# Patient Record
Sex: Female | Born: 1965 | Race: White | Hispanic: No | Smoking: Current every day smoker
Health system: Southern US, Community
[De-identification: ages and names within clinical notes are randomized; demographics above are authoritative.]

## PROBLEM LIST (undated history)

## (undated) DIAGNOSIS — M549 Dorsalgia, unspecified: Secondary | ICD-10-CM

## (undated) DIAGNOSIS — E785 Hyperlipidemia, unspecified: Secondary | ICD-10-CM

## (undated) DIAGNOSIS — I255 Ischemic cardiomyopathy: Secondary | ICD-10-CM

## (undated) DIAGNOSIS — I519 Heart disease, unspecified: Secondary | ICD-10-CM

## (undated) DIAGNOSIS — B019 Varicella without complication: Secondary | ICD-10-CM

## (undated) DIAGNOSIS — K921 Melena: Secondary | ICD-10-CM

## (undated) DIAGNOSIS — K219 Gastro-esophageal reflux disease without esophagitis: Secondary | ICD-10-CM

## (undated) DIAGNOSIS — G8929 Other chronic pain: Secondary | ICD-10-CM

## (undated) DIAGNOSIS — I10 Essential (primary) hypertension: Secondary | ICD-10-CM

## (undated) DIAGNOSIS — E669 Obesity, unspecified: Secondary | ICD-10-CM

## (undated) DIAGNOSIS — I214 Non-ST elevation (NSTEMI) myocardial infarction: Secondary | ICD-10-CM

## (undated) DIAGNOSIS — I2510 Atherosclerotic heart disease of native coronary artery without angina pectoris: Secondary | ICD-10-CM

## (undated) HISTORY — PX: CARDIAC CATHETERIZATION: SHX172

## (undated) HISTORY — DX: Atherosclerotic heart disease of native coronary artery without angina pectoris: I25.10

## (undated) HISTORY — DX: Varicella without complication: B01.9

## (undated) HISTORY — DX: Ischemic cardiomyopathy: I25.5

## (undated) HISTORY — DX: Heart disease, unspecified: I51.9

## (undated) HISTORY — DX: Melena: K92.1

## (undated) HISTORY — DX: Non-ST elevation (NSTEMI) myocardial infarction: I21.4

## (undated) HISTORY — DX: Gastro-esophageal reflux disease without esophagitis: K21.9

## (undated) HISTORY — PX: TUBAL LIGATION: SHX77

---

## 2012-05-27 ENCOUNTER — Ambulatory Visit: Payer: Self-pay | Admitting: Internal Medicine

## 2012-06-04 ENCOUNTER — Ambulatory Visit: Payer: Self-pay | Admitting: Internal Medicine

## 2012-12-18 ENCOUNTER — Ambulatory Visit: Payer: Self-pay | Admitting: Surgery

## 2014-06-15 IMAGING — MG MM CAD SCREENING MAMMO
1 series · 4 of 4 positions shown · non-contrast
Comparison: None.

REASON FOR EXAM: SCR MAMMO NO ORDER BASELINE
COMMENTS:

PROCEDURE:     MAM - MAM DGTL SCRN MAM NO ORDER W/CAD  - May 27, 2012  [DATE]
RESULT:

[R CC · right · 4 of 4 slices shown]
[im 1/4]
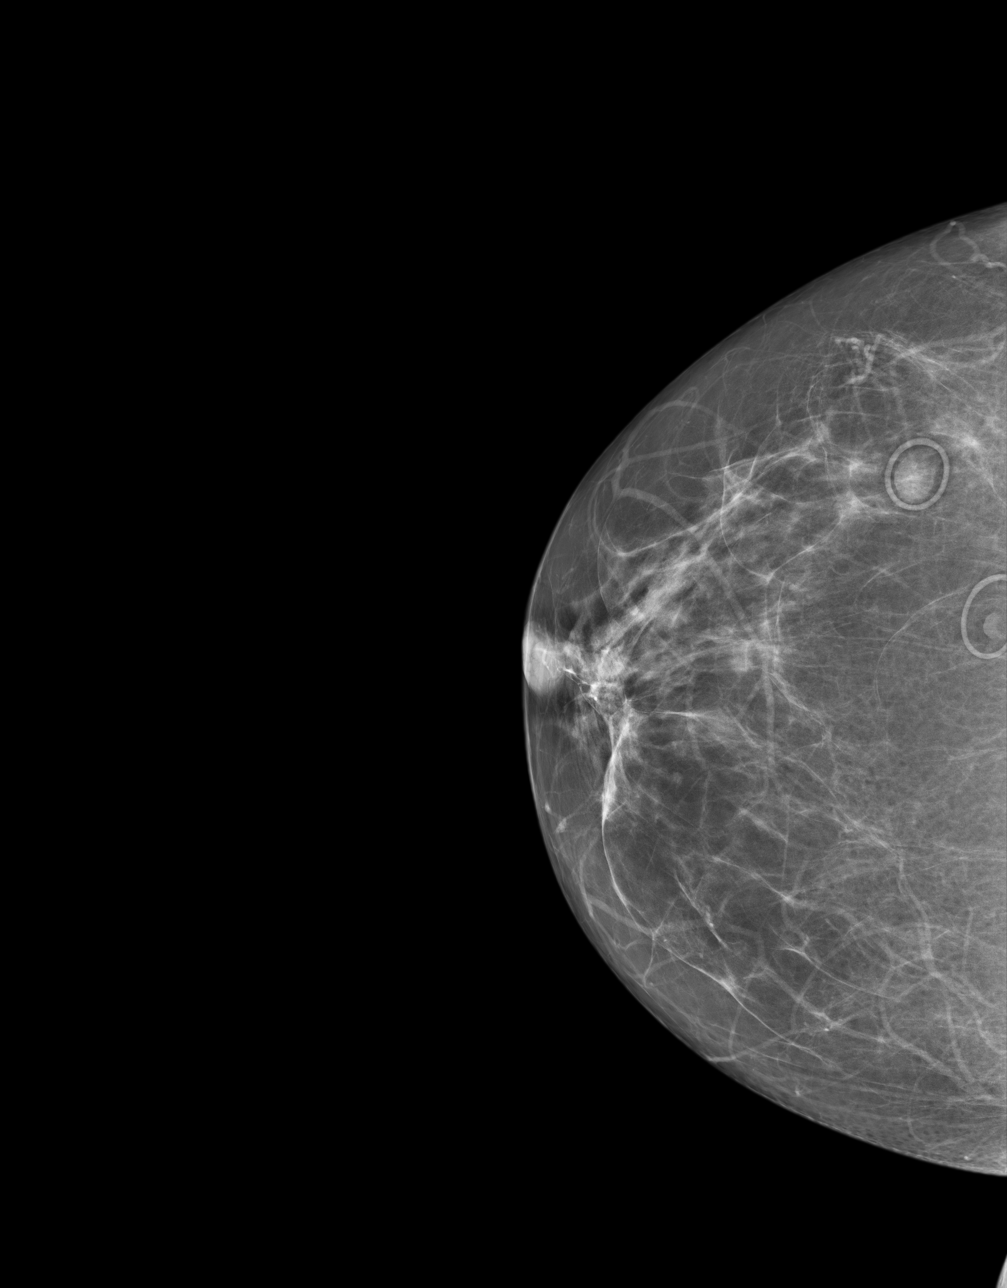
[im 2/4]
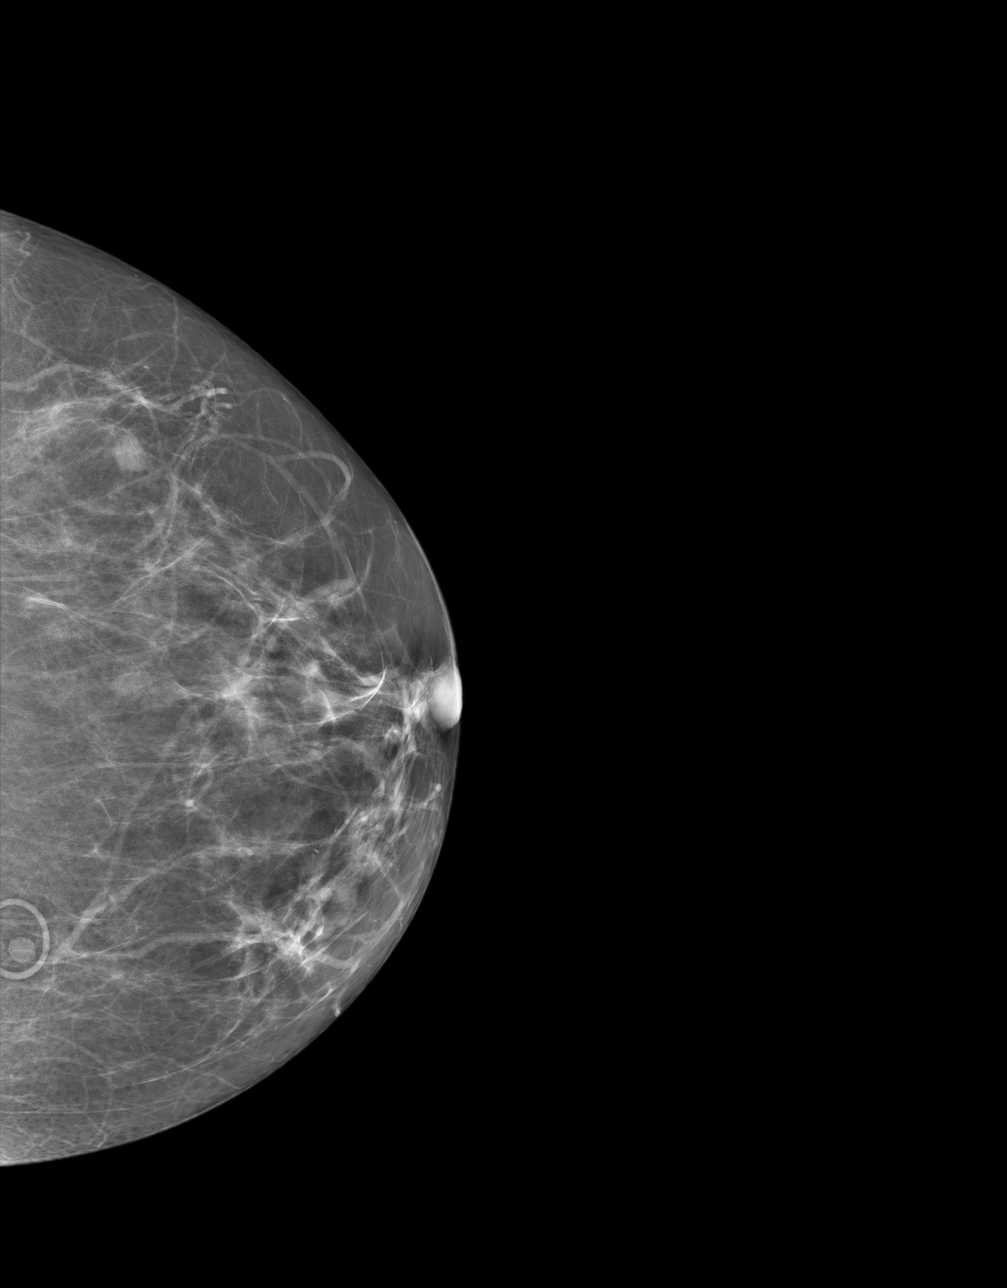
[im 3/4]
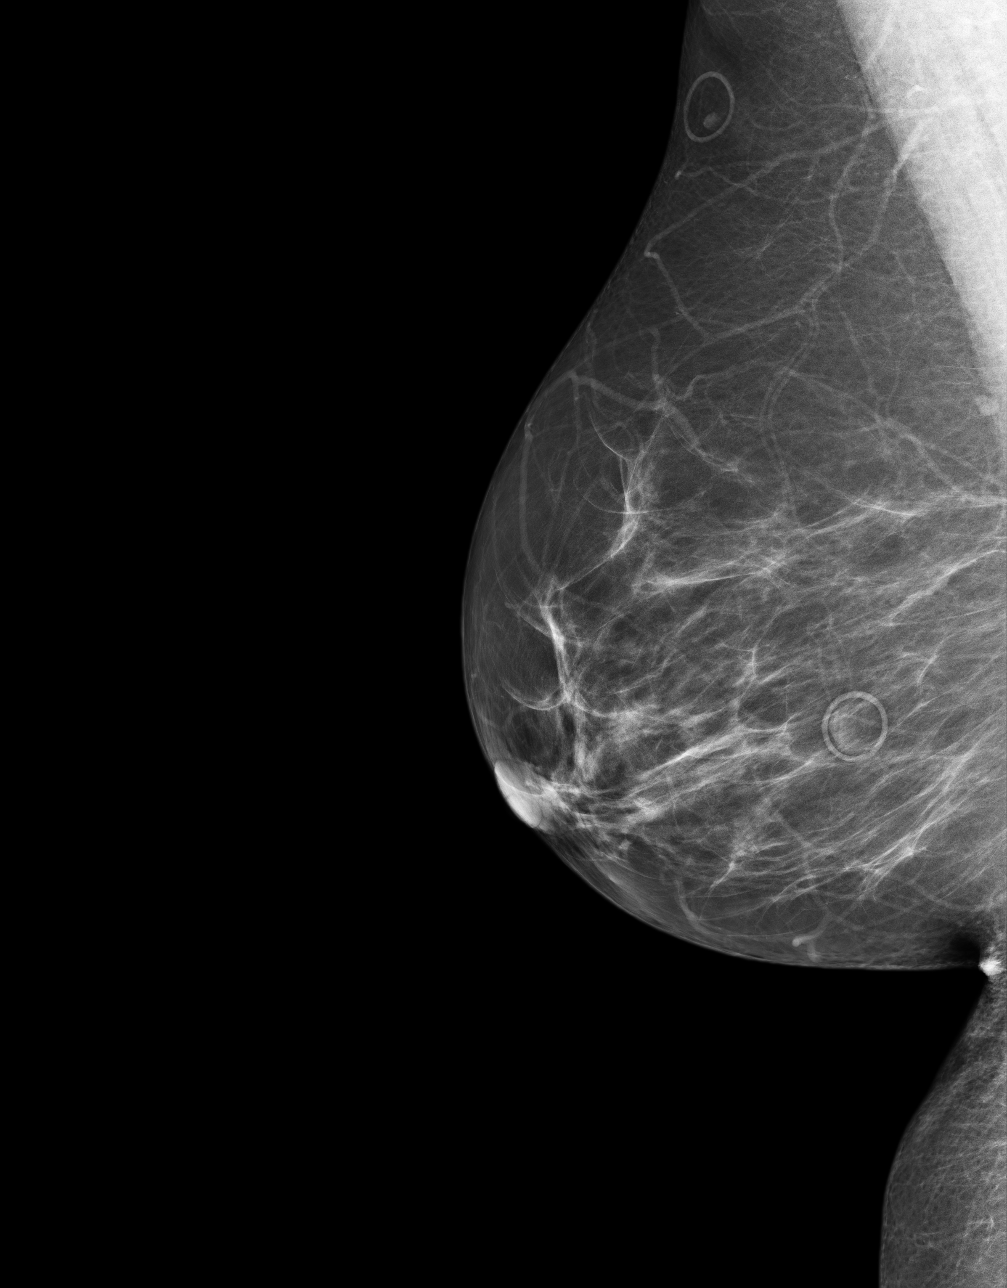
[im 4/4]
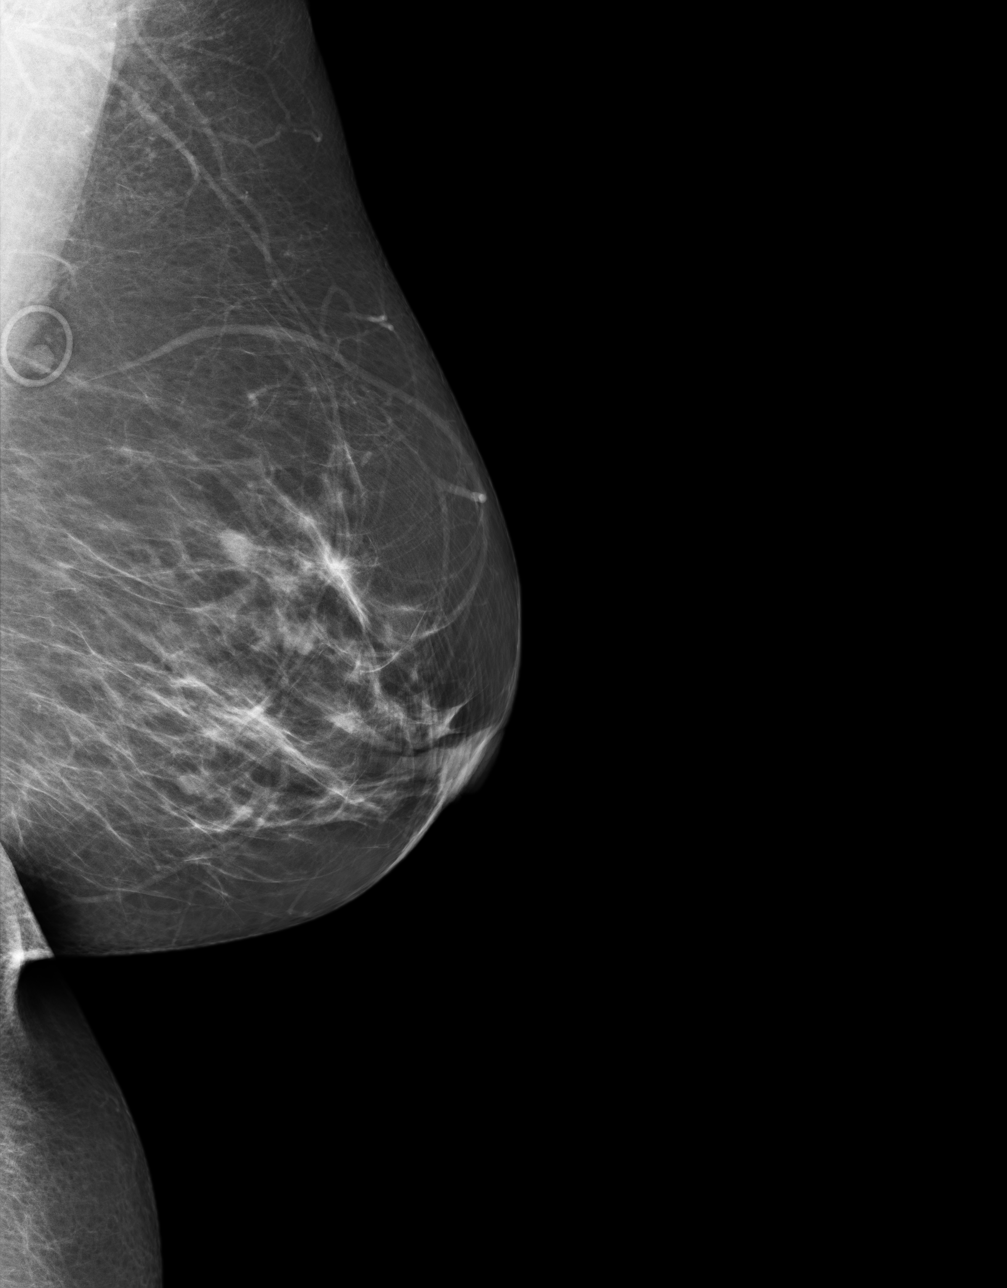

[4 of 4 positions shown; findings below may reference images not displayed]

FINDINGS: There is scattered fibroglandular tissue. There is a small round mass in the
superolateral left breast. No suspicious masses or calcifications are
identified in the right breast.
IMPRESSION: BI-RADS: Category 0 - Needs Addtional Imaging Evaluation

BREAST COMPOSITION: The breast composition is SCATTERED FIBROGLANDULAR
TISSUE (glandular tissue is 25-50%)

Recommend true lateral and spot compression magnification views of the small
mass in the superolateral left breast. Ultrasound will likely be necessary
at that time.

A NEGATIVE MAMMOGRAM REPORT DOES NOT PRECLUDE BIOPSY OR OTHER EVALUATION OF
A CLINICALLY PALPABLE OR OTHERWISE SUSPICIOUS MASS OR LESION. BREAST CANCER
MAY NOT BE DETECTED BY MAMMOGRAPHY IN UP TO 10% OF CASES.

## 2016-06-18 DIAGNOSIS — I214 Non-ST elevation (NSTEMI) myocardial infarction: Secondary | ICD-10-CM

## 2016-06-18 HISTORY — DX: Non-ST elevation (NSTEMI) myocardial infarction: I21.4

## 2016-07-10 ENCOUNTER — Inpatient Hospital Stay
Admission: EM | Admit: 2016-07-10 | Discharge: 2016-07-12 | DRG: 281 | Disposition: A | Discharge status: Home / Self Care | Payer: Managed Care, Other (non HMO) | Attending: Internal Medicine | Admitting: Internal Medicine

## 2016-07-10 ENCOUNTER — Emergency Department: Payer: Managed Care, Other (non HMO)

## 2016-07-10 ENCOUNTER — Encounter: Payer: Self-pay | Admitting: *Deleted

## 2016-07-10 DIAGNOSIS — E871 Hypo-osmolality and hyponatremia: Secondary | ICD-10-CM | POA: Diagnosis present

## 2016-07-10 DIAGNOSIS — I2109 ST elevation (STEMI) myocardial infarction involving other coronary artery of anterior wall: Secondary | ICD-10-CM | POA: Diagnosis present

## 2016-07-10 DIAGNOSIS — I214 Non-ST elevation (NSTEMI) myocardial infarction: Secondary | ICD-10-CM | POA: Diagnosis not present

## 2016-07-10 DIAGNOSIS — G8929 Other chronic pain: Secondary | ICD-10-CM | POA: Diagnosis present

## 2016-07-10 DIAGNOSIS — I255 Ischemic cardiomyopathy: Secondary | ICD-10-CM | POA: Diagnosis present

## 2016-07-10 DIAGNOSIS — I10 Essential (primary) hypertension: Secondary | ICD-10-CM | POA: Diagnosis present

## 2016-07-10 DIAGNOSIS — I471 Supraventricular tachycardia: Secondary | ICD-10-CM | POA: Diagnosis not present

## 2016-07-10 DIAGNOSIS — Z8249 Family history of ischemic heart disease and other diseases of the circulatory system: Secondary | ICD-10-CM

## 2016-07-10 DIAGNOSIS — I2582 Chronic total occlusion of coronary artery: Secondary | ICD-10-CM | POA: Diagnosis present

## 2016-07-10 DIAGNOSIS — Z6835 Body mass index (BMI) 35.0-35.9, adult: Secondary | ICD-10-CM

## 2016-07-10 DIAGNOSIS — Z720 Tobacco use: Secondary | ICD-10-CM | POA: Diagnosis not present

## 2016-07-10 DIAGNOSIS — I213 ST elevation (STEMI) myocardial infarction of unspecified site: Secondary | ICD-10-CM | POA: Diagnosis not present

## 2016-07-10 DIAGNOSIS — I2510 Atherosclerotic heart disease of native coronary artery without angina pectoris: Secondary | ICD-10-CM | POA: Diagnosis present

## 2016-07-10 DIAGNOSIS — F17210 Nicotine dependence, cigarettes, uncomplicated: Secondary | ICD-10-CM | POA: Diagnosis present

## 2016-07-10 DIAGNOSIS — E785 Hyperlipidemia, unspecified: Secondary | ICD-10-CM | POA: Diagnosis present

## 2016-07-10 DIAGNOSIS — M549 Dorsalgia, unspecified: Secondary | ICD-10-CM | POA: Diagnosis present

## 2016-07-10 DIAGNOSIS — R739 Hyperglycemia, unspecified: Secondary | ICD-10-CM | POA: Diagnosis present

## 2016-07-10 DIAGNOSIS — R079 Chest pain, unspecified: Secondary | ICD-10-CM | POA: Diagnosis present

## 2016-07-10 DIAGNOSIS — E782 Mixed hyperlipidemia: Secondary | ICD-10-CM | POA: Diagnosis not present

## 2016-07-10 HISTORY — DX: Obesity, unspecified: E66.9

## 2016-07-10 HISTORY — DX: Hyperlipidemia, unspecified: E78.5

## 2016-07-10 HISTORY — DX: Other chronic pain: G89.29

## 2016-07-10 HISTORY — DX: Dorsalgia, unspecified: M54.9

## 2016-07-10 HISTORY — DX: Essential (primary) hypertension: I10

## 2016-07-10 LAB — BASIC METABOLIC PANEL
ANION GAP: 8 (ref 5–15)
BUN: 12 mg/dL (ref 6–20)
CO2: 25 mmol/L (ref 22–32)
Calcium: 9 mg/dL (ref 8.9–10.3)
Chloride: 101 mmol/L (ref 101–111)
Creatinine, Ser: 0.61 mg/dL (ref 0.44–1.00)
GFR calc Af Amer: >60 mL/min (ref >60)
GLUCOSE: 110 mg/dL — AB (ref 65–99)
POTASSIUM: 3.7 mmol/L (ref 3.5–5.1)
Sodium: 134 mmol/L — ABNORMAL LOW (ref 135–145)

## 2016-07-10 LAB — CBC
HEMATOCRIT: 39.5 % (ref 35.0–47.0)
HEMOGLOBIN: 13.1 g/dL (ref 12.0–16.0)
MCH: 31.4 pg (ref 26.0–34.0)
MCHC: 33.2 g/dL (ref 32.0–36.0)
MCV: 94.6 fL (ref 80.0–100.0)
Platelets: 222 10*3/uL (ref 150–440)
RBC: 4.17 MIL/uL (ref 3.80–5.20)
RDW: 15.8 % — ABNORMAL HIGH (ref 11.5–14.5)
WBC: 9.9 10*3/uL (ref 3.6–11.0)

## 2016-07-10 LAB — TROPONIN I: Troponin I: 9.37 ng/mL (ref <0.03)

## 2016-07-10 LAB — APTT: APTT: 28 s (ref 24–36)

## 2016-07-10 LAB — PROTIME-INR
INR: 1
PROTHROMBIN TIME: 13.2 s (ref 11.4–15.2)

## 2016-07-10 MED ORDER — ATORVASTATIN CALCIUM 20 MG PO TABS: 40.0000 mg | ORAL_TABLET | Freq: Every day | ORAL | Status: DC

## 2016-07-10 MED ORDER — SODIUM CHLORIDE 0.9 % IV SOLN: 250.0000 mL | INTRAVENOUS | Status: DC | PRN

## 2016-07-10 MED ORDER — HEPARIN (PORCINE) IN NACL 100-0.45 UNIT/ML-% IJ SOLN
1300.0000 [IU]/h | INTRAMUSCULAR | Status: DC
  Administered 2016-07-10: 1100 [IU]/h via INTRAVENOUS
  Filled 2016-07-10: qty 250

## 2016-07-10 MED ORDER — ASPIRIN 81 MG PO CHEW
324.0000 mg | CHEWABLE_TABLET | Freq: Once | ORAL | Status: AC
  Administered 2016-07-10: 324 mg via ORAL
  Filled 2016-07-10: qty 4

## 2016-07-10 MED ORDER — ACETAMINOPHEN 325 MG PO TABS: 650.0000 mg | ORAL_TABLET | ORAL | Status: DC | PRN

## 2016-07-10 MED ORDER — ONDANSETRON HCL 4 MG/2ML IJ SOLN: 4.0000 mg | Freq: Four times a day (QID) | INTRAMUSCULAR | Status: DC | PRN

## 2016-07-10 MED ORDER — NITROGLYCERIN 0.4 MG SL SUBL: 0.4000 mg | SUBLINGUAL_TABLET | SUBLINGUAL | Status: DC | PRN

## 2016-07-10 MED ORDER — ASPIRIN EC 81 MG PO TBEC
81.0000 mg | DELAYED_RELEASE_TABLET | Freq: Every day | ORAL | Status: DC
  Administered 2016-07-11 – 2016-07-12 (×2): 81 mg via ORAL
  Filled 2016-07-10 (×3): qty 1

## 2016-07-10 MED ORDER — SODIUM CHLORIDE 0.9% FLUSH: 3.0000 mL | INTRAVENOUS | Status: DC | PRN

## 2016-07-10 MED ORDER — SODIUM CHLORIDE 0.9% FLUSH
3.0000 mL | Freq: Two times a day (BID) | INTRAVENOUS | Status: DC
  Administered 2016-07-11 – 2016-07-12 (×2): 3 mL via INTRAVENOUS

## 2016-07-10 MED ORDER — HEPARIN BOLUS VIA INFUSION
4000.0000 [IU] | Freq: Once | INTRAVENOUS | Status: AC
  Administered 2016-07-10: 4000 [IU] via INTRAVENOUS
  Filled 2016-07-10: qty 4000

## 2016-07-10 MED ORDER — METOPROLOL TARTRATE 25 MG PO TABS
12.5000 mg | ORAL_TABLET | Freq: Two times a day (BID) | ORAL | Status: DC
Start: 2016-07-10 — End: 2016-07-11

## 2016-07-10 NOTE — ED Triage Notes (Signed)
Pt sent from [facility] for evaluation of intermittent CP for three weeks. Pt reported pain is usually at night but was during the day yesterday as well. Pt reports pain as a dull ache. Pt also reports has not had BM in five days. No apparent distress upon arrival from [facility].

## 2016-07-10 NOTE — ED Provider Notes (Signed)
[facility] Emergency Department Provider Note  Time seen: 6:59 PM  I have reviewed the triage vital signs and the nursing notes.   HISTORY  Chief Complaint Chest Pain    HPI Deanna Moran is a 51 y.o. female who presents to the emergency department for chest pain. According to the patient the past 3 weeks she has had intermittent chest discomfort which she states is central chest discomfort feels somewhat like heartburn. She states one week ago with the chest discomfort she got very nauseated and had one episode of vomiting. Denies any diaphoresis. Denies any shortness of breath. Denies any nausea since 1 week ago. Patient states the chest pain has been intermittent however since yesterday it has been constant. Patient states the chest pain is improved, currently mild but remains somewhat constant since yesterday which concerned her enough to come to the emergency department. Denies any worsening of the pain today. Currently describes the discomfort as mild feels like heartburn or indigestion. Denies shortness of breath nausea or diaphoresis.  History reviewed. No pertinent past medical history.  There are no active problems to display for this patient.   History reviewed. No pertinent surgical history.  Prior to Admission medications   Not on File    No Known Allergies  History reviewed. No pertinent family history.  Social History Social History  Substance Use Topics  . Smoking status: Current Every Day Smoker  . Smokeless tobacco: Never Used  . Alcohol use No    Review of Systems Constitutional: Negative for fever. Cardiovascular: Positive for intermittent chest pain 3 weeks Respiratory: Negative for shortness of breath. Gastrointestinal: Negative for abdominal pain Neurological: Negative for headache All other ROS negative  ____________________________________________   PHYSICAL EXAM:  VITAL SIGNS: ED Triage Vitals  Enc Vitals  Group     BP 07/10/16 1800 (!) 146/77     Pulse Rate 07/10/16 1800 81     Resp 07/10/16 1800 18     Temp 07/10/16 1800 98.9 F (37.2 C)     Temp Source 07/10/16 1800 Oral     SpO2 07/10/16 1800 98 %     Weight 07/10/16 1757 241 lb (109.3 kg)     Height 07/10/16 1757 5\' 9"  (1.753 m)     Head Circumference --      Peak Flow --      Pain Score 07/10/16 1757 3     Pain Loc --      Pain Edu? --      Excl. in [place]? --     Constitutional: Alert and oriented. Well appearing and in no distress. Eyes: Normal exam ENT   Head: Normocephalic and atraumatic.   Mouth/Throat: Mucous membranes are moist. Cardiovascular: Normal rate, regular rhythm. No murmur Respiratory: Normal respiratory effort without tachypnea nor retractions. Breath sounds are clear  Gastrointestinal: Soft and nontender. No distention.   Musculoskeletal: Nontender with normal range of motion in all extremities. No lower extremity tenderness or edema. Neurologic:  Normal speech and language. No gross focal neurologic deficits Skin:  Skin is warm, dry and intact.  Psychiatric: Mood and affect are normal.   ____________________________________________    EKG  EKG reviewed and interpreted by myself shows normal sinus rhythm at 74 bpm, narrow QRS, normal axis, normal intervals, nonspecific ST changes, with slight ST elevation in leads V3 and V4. No reciprocal changes identified.  ____________________________________________    RADIOLOGY  Chest x-ray negative  ____________________________________________   INITIAL IMPRESSION / ASSESSMENT AND PLAN /  ED COURSE  Pertinent labs & imaging results that were available during my care of the patient were reviewed by me and considered in my medical decision making (see chart for details).  The patient presents to the emergency department with intermittent chest pain over the past 3 weeks, fairly constant over the past 24 hours. Describes the discomfort is very mild  currently. Had nausea 1 week ago but denies any current nausea dyspnea or diaphoresis. Patient's EKG shows some abnormal changes. I discussed the patient's EKG with Dr. Fletcher Anon, and we agree most likely NSTEMI. We will start the patient on a heparin drip and admitted to the hospital.  CRITICAL CARE Performed by: Harvest Dark   Total critical care time: 30 minutes  Critical care time was exclusive of separately billable procedures and treating other patients.  Critical care was necessary to treat or prevent imminent or life-threatening deterioration.  Critical care was time spent personally by me on the following activities: development of treatment plan with patient and/or surrogate as well as nursing, discussions with consultants, evaluation of patient's response to treatment, examination of patient, obtaining history from patient or surrogate, ordering and performing treatments and interventions, ordering and review of laboratory studies, ordering and review of radiographic studies, pulse oximetry and re-evaluation of patient's condition.   ____________________________________________   FINAL CLINICAL IMPRESSION(S) / ED DIAGNOSES  NSTEMI    Harvest Dark, MD 07/10/16 [phone]

## 2016-07-10 NOTE — Progress Notes (Signed)
ANTICOAGULATION CONSULT NOTE - Initial Consult  Pharmacy Consult for heparin Indication: chest pain/ACS  No Known Allergies  Patient Measurements: Height: 5\' 9"  (175.3 cm) Weight: 241 lb (109.3 kg) IBW/kg (Calculated) : 66.2 Heparin Dosing Weight: 90.7 kg  Vital Signs: Temp: 98.9 F (37.2 C) (04/23 1800) Temp Source: Oral (04/23 1800) BP: 146/77 (04/23 1800) Pulse Rate: 81 (04/23 1800)  Labs:  Recent Labs  07/10/16 1754  HGB 13.1  HCT 39.5  PLT 222  CREATININE 0.61  TROPONINI 9.37*    Estimated Creatinine Clearance: 108.3 mL/min (by C-G formula based on SCr of 0.61 mg/dL).   Medical History: History reviewed. No pertinent past medical history.  Assessment: 51yo F presents with chest pain. No prior anticoagulation.  Baseline labs ordered.   Goal of Therapy:  Heparin level 0.3-0.7 units/ml Monitor platelets by anticoagulation protocol: Yes   Plan:  Give 4000 units bolus x 1 Start heparin infusion at 1100 units/hr Check anti-Xa level in 6 hours and daily while on heparin Continue to monitor H&H and platelets  Loree Fee, PharmD 07/10/2016,7:21 PM

## 2016-07-10 NOTE — H&P (Addendum)
[place] at [place] NAME: Deanna Moran    MR#:  [id number]  DATE OF BIRTH:  1965/05/20  DATE OF ADMISSION:  07/10/2016  PRIMARY CARE PHYSICIAN: No PCP Per Patient   REQUESTING/REFERRING PHYSICIAN: Harvest Dark, MD  CHIEF COMPLAINT:   Chief Complaint  Patient presents with  . Chest Pain   Chest pain. 3 weeks, worsening since yesterday. HISTORY OF PRESENT ILLNESS:  Deanna Moran  is a 51 y.o. female with a known history of Hypertension and chronic back pain. The patient has had the chest pain in substernal area, intermittent without radiation. The chest pain has been worsening and becoming constant since yesterday. He also had nausea but denies any diaphoresis, palpitation, orthopnea or leg edema. Her troponin level is elevated at 9.37. Dr. Fletcher Anon, cardiologist, suggested non-STEMI and start heparin drip. He suggested cardiac catheter tomorrow.  PAST MEDICAL HISTORY:   Past Medical History:  Diagnosis Date  . Chronic back pain   . HTN (hypertension)     PAST SURGICAL HISTORY:   Past Surgical History:  Procedure Laterality Date  . TUBAL LIGATION      SOCIAL HISTORY:   Social History  Substance Use Topics  . Smoking status: Current Every Day Smoker    Packs/day: 1.00    Years: 36.00    Types: Cigarettes  . Smokeless tobacco: Never Used  . Alcohol use No    FAMILY HISTORY:   Family History  Problem Relation Age of Onset  . Heart attack Father     DRUG ALLERGIES:  No Known Allergies  REVIEW OF SYSTEMS:   Review of Systems  Constitutional: Negative for chills, fever and malaise/fatigue.  HENT: Negative for congestion and sore throat.   Eyes: Negative for blurred vision and double vision.  Respiratory: Negative for cough, shortness of breath and wheezing.   Cardiovascular: Positive for chest pain. Negative for palpitations, orthopnea and leg swelling.  Gastrointestinal: Positive for nausea. Negative for  abdominal pain, blood in stool, constipation, diarrhea, melena and vomiting.  Genitourinary: Negative for dysuria, flank pain, frequency, hematuria and urgency.  Musculoskeletal: Negative for back pain.  Skin: Negative for itching and rash.  Neurological: Negative for dizziness, tingling, focal weakness, loss of consciousness, weakness and headaches.  Psychiatric/Behavioral: Negative for depression. The patient is not nervous/anxious.     MEDICATIONS AT HOME:   Prior to Admission medications   Medication Sig Start Date End Date Taking? Authorizing Provider  prednisoLONE acetate (PRED FORTE) 1 % ophthalmic suspension Place 2 drops into both eyes 4 (four) times daily.   Yes Historical Provider, MD      VITAL SIGNS:  Blood pressure (!) 149/81, pulse 82, temperature 98.9 F (37.2 C), temperature source Oral, resp. rate 18, height 5' 9"  (1.753 m), weight 241 lb (109.3 kg), SpO2 97 %.  PHYSICAL EXAMINATION:  Physical Exam  GENERAL:  51 y.o.-year-old patient lying in the bed with no acute distress. Morbidly obese. EYES: Pupils equal, round, reactive to light and accommodation. No scleral icterus. Extraocular muscles intact.  HEENT: Head atraumatic, normocephalic. Oropharynx and nasopharynx clear.  NECK:  Supple, no jugular venous distention. No thyroid enlargement, no tenderness.  LUNGS: Normal breath sounds bilaterally, no wheezing, rales,rhonchi or crepitation. No use of accessory muscles of respiration.  CARDIOVASCULAR: S1, S2 normal. No murmurs, rubs, or gallops.  ABDOMEN: Soft, nontender, nondistended. Bowel sounds present. No organomegaly or mass.  EXTREMITIES: No pedal edema, cyanosis, or clubbing.  NEUROLOGIC: Cranial nerves II through XII  are intact. Muscle strength 5/5 in all extremities. Sensation intact. Gait not checked.  PSYCHIATRIC: The patient is alert and oriented x 3.  SKIN: No obvious rash, lesion, or ulcer.   LABORATORY PANEL:   CBC  Recent Labs Lab 07/10/16 1754   WBC 9.9  HGB 13.1  HCT 39.5  PLT 222   ------------------------------------------------------------------------------------------------------------------  Chemistries   Recent Labs Lab 07/10/16 1754  NA 134*  K 3.7  CL 101  CO2 25  GLUCOSE 110*  BUN 12  CREATININE 0.61  CALCIUM 9.0   ------------------------------------------------------------------------------------------------------------------  Cardiac Enzymes  Recent Labs Lab 07/10/16 1754  TROPONINI 9.37*   ------------------------------------------------------------------------------------------------------------------  RADIOLOGY:  Dg Chest 2 View  Result Date: 07/10/2016 CLINICAL DATA:  Intermittent chest pain over the last 3 weeks. EXAM: CHEST  2 VIEW COMPARISON:  None. FINDINGS: Heart size is normal. Mediastinal shadows are normal. The lungs are clear. No bronchial thickening. No infiltrate, mass, effusion or collapse. Pulmonary vascularity is normal. No bony abnormality. IMPRESSION: Normal chest Electronically Signed   By: Nelson Chimes M.D.   On: 07/10/2016 18:33      IMPRESSION AND PLAN:   Non-STEMI The patient will be admitted to telemetry floor. Start aspirin 325 mg, Lipitor and heparin drip. Nothing by mouth for possible cardiac catheter tomorrow. Follow-up cardiology consult. Check lipid panel and hemoglobin A1c. Follow-up troponin level.  Mild hyponatremia. Normal saline IV and follow-up BMP. Hypertension. Start Lopressor twice a day. Obesity. Check lipid panel. Tobacco abuse. Smoking cessation was counseled for 4 minutes, nicotine patch.  All the records are reviewed and case discussed with ED provider. Management plans discussed with the patient, her son and they are in agreement.  CODE STATUS: Full code  TOTAL TIME TAKING CARE OF THIS PATIENT: 52 minutes.    Demetrios Loll M.D on 07/10/2016 at 7:47 PM  Between 7am to 6pm - Pager - [phone]  After 6pm go to [email or web address] - [employer], building services [facility]  Office  [phone]  CC: Primary care physician; No PCP Per Patient   Note: This dictation was prepared with Dragon dictation along with smaller phrase technology. Any transcriptional errors that result from this process are unintentional.

## 2016-07-11 ENCOUNTER — Encounter: Payer: Self-pay | Admitting: Physician Assistant

## 2016-07-11 ENCOUNTER — Encounter
Admission: EM | Disposition: A | Discharge status: Home / Self Care | Payer: Self-pay | Source: Home / Self Care | Attending: Internal Medicine

## 2016-07-11 DIAGNOSIS — I213 ST elevation (STEMI) myocardial infarction of unspecified site: Secondary | ICD-10-CM

## 2016-07-11 DIAGNOSIS — I10 Essential (primary) hypertension: Secondary | ICD-10-CM

## 2016-07-11 DIAGNOSIS — I2510 Atherosclerotic heart disease of native coronary artery without angina pectoris: Secondary | ICD-10-CM

## 2016-07-11 DIAGNOSIS — Z720 Tobacco use: Secondary | ICD-10-CM

## 2016-07-11 DIAGNOSIS — E785 Hyperlipidemia, unspecified: Secondary | ICD-10-CM

## 2016-07-11 HISTORY — PX: LEFT HEART CATH AND CORONARY ANGIOGRAPHY: CATH118249

## 2016-07-11 LAB — LIPID PANEL
CHOL/HDL RATIO: 4.6 ratio
Cholesterol: 144 mg/dL (ref 0–200)
HDL: 31 mg/dL — AB (ref >40)
LDL CALC: 103 mg/dL — AB (ref 0–99)
TRIGLYCERIDES: 52 mg/dL (ref <150)
VLDL: 10 mg/dL (ref 0–40)

## 2016-07-11 LAB — TSH: TSH: 2.089 u[IU]/mL (ref 0.350–4.500)

## 2016-07-11 LAB — TROPONIN I
TROPONIN I: 10.78 ng/mL — AB (ref <0.03)
Troponin I: 9.47 ng/mL (ref <0.03)

## 2016-07-11 LAB — HEPARIN LEVEL (UNFRACTIONATED): Heparin Unfractionated: 0.17 IU/mL — ABNORMAL LOW (ref 0.30–0.70)

## 2016-07-11 SURGERY — LEFT HEART CATH AND CORONARY ANGIOGRAPHY
Anesthesia: Moderate Sedation

## 2016-07-11 MED ORDER — SODIUM CHLORIDE 0.9% FLUSH: 3.0000 mL | INTRAVENOUS | Status: DC | PRN

## 2016-07-11 MED ORDER — LIDOCAINE HCL (PF) 1 % IJ SOLN
INTRAMUSCULAR | Status: DC | PRN
  Administered 2016-07-11: 1 mL

## 2016-07-11 MED ORDER — HEPARIN SODIUM (PORCINE) 5000 UNIT/ML IJ SOLN
5000.0000 [IU] | Freq: Three times a day (TID) | INTRAMUSCULAR | Status: DC
  Administered 2016-07-11 – 2016-07-12 (×2): 5000 [IU] via SUBCUTANEOUS
  Filled 2016-07-11 (×2): qty 1

## 2016-07-11 MED ORDER — HEPARIN SODIUM (PORCINE) 1000 UNIT/ML IJ SOLN
INTRAMUSCULAR | Status: DC | PRN
  Administered 2016-07-11: 5000 [IU] via INTRAVENOUS

## 2016-07-11 MED ORDER — IOPAMIDOL (ISOVUE-300) INJECTION 61%
INTRAVENOUS | Status: DC | PRN
  Administered 2016-07-11: 40 mL via INTRA_ARTERIAL

## 2016-07-11 MED ORDER — MIDAZOLAM HCL 2 MG/2ML IJ SOLN
INTRAMUSCULAR | Status: DC | PRN
  Administered 2016-07-11: 1 mg via INTRAVENOUS

## 2016-07-11 MED ORDER — HEPARIN (PORCINE) IN NACL 2-0.9 UNIT/ML-% IJ SOLN
INTRAMUSCULAR | Status: AC
  Filled 2016-07-11: qty 500

## 2016-07-11 MED ORDER — HEPARIN SODIUM (PORCINE) 1000 UNIT/ML IJ SOLN
INTRAMUSCULAR | Status: AC
  Filled 2016-07-11: qty 1

## 2016-07-11 MED ORDER — MIDAZOLAM HCL 2 MG/2ML IJ SOLN
INTRAMUSCULAR | Status: AC
  Filled 2016-07-11: qty 2

## 2016-07-11 MED ORDER — ATORVASTATIN CALCIUM 20 MG PO TABS
80.0000 mg | ORAL_TABLET | Freq: Every day | ORAL | Status: DC
  Administered 2016-07-11: 80 mg via ORAL
  Filled 2016-07-11 (×2): qty 1
  Filled 2016-07-11: qty 4

## 2016-07-11 MED ORDER — VERAPAMIL HCL 2.5 MG/ML IV SOLN
INTRAVENOUS | Status: AC
  Filled 2016-07-11: qty 2

## 2016-07-11 MED ORDER — SODIUM CHLORIDE 0.9 % WEIGHT BASED INFUSION: 1.0000 mL/kg/h | INTRAVENOUS | Status: DC

## 2016-07-11 MED ORDER — CLOPIDOGREL BISULFATE 75 MG PO TABS
75.0000 mg | ORAL_TABLET | Freq: Every day | ORAL | Status: DC
  Administered 2016-07-12: 75 mg via ORAL
  Filled 2016-07-11: qty 1

## 2016-07-11 MED ORDER — FENTANYL CITRATE (PF) 100 MCG/2ML IJ SOLN
INTRAMUSCULAR | Status: DC | PRN
  Administered 2016-07-11: 50 ug via INTRAVENOUS

## 2016-07-11 MED ORDER — HEPARIN BOLUS VIA INFUSION
3300.0000 [IU] | Freq: Once | INTRAVENOUS | Status: AC
  Administered 2016-07-11: 3300 [IU] via INTRAVENOUS
  Filled 2016-07-11: qty 3300

## 2016-07-11 MED ORDER — SODIUM CHLORIDE 0.9% FLUSH: 3.0000 mL | Freq: Two times a day (BID) | INTRAVENOUS | Status: DC

## 2016-07-11 MED ORDER — SODIUM CHLORIDE 0.9 % IV SOLN: 250.0000 mL | INTRAVENOUS | Status: DC | PRN

## 2016-07-11 MED ORDER — SODIUM CHLORIDE 0.9 % WEIGHT BASED INFUSION
3.0000 mL/kg/h | INTRAVENOUS | Status: DC
  Administered 2016-07-11: 3 mL/kg/h via INTRAVENOUS

## 2016-07-11 MED ORDER — SODIUM CHLORIDE 0.9 % IV SOLN
INTRAVENOUS | Status: DC
  Administered 2016-07-11: 11:00:00 via INTRAVENOUS

## 2016-07-11 MED ORDER — VERAPAMIL HCL 2.5 MG/ML IV SOLN
INTRAVENOUS | Status: DC | PRN
  Administered 2016-07-11: 2.5 mg via INTRAVENOUS

## 2016-07-11 MED ORDER — FENTANYL CITRATE (PF) 100 MCG/2ML IJ SOLN
INTRAMUSCULAR | Status: AC
  Filled 2016-07-11: qty 2

## 2016-07-11 MED ORDER — CLOPIDOGREL BISULFATE 75 MG PO TABS
300.0000 mg | ORAL_TABLET | Freq: Once | ORAL | Status: AC
  Administered 2016-07-11: 300 mg via ORAL
  Filled 2016-07-11: qty 4

## 2016-07-11 MED ORDER — LIDOCAINE HCL (PF) 1 % IJ SOLN
INTRAMUSCULAR | Status: AC
  Filled 2016-07-11: qty 30

## 2016-07-11 MED ORDER — CARVEDILOL 3.125 MG PO TABS
3.1250 mg | ORAL_TABLET | Freq: Two times a day (BID) | ORAL | Status: DC
  Administered 2016-07-11 – 2016-07-12 (×3): 3.125 mg via ORAL
  Filled 2016-07-11 (×3): qty 1

## 2016-07-11 SURGICAL SUPPLY — 7 items
CATH 5FR JR4 DIAGNOSTIC (CATHETERS) ×3 IMPLANT
CATH INFINITI 5 FR JL3.5 (CATHETERS) ×3 IMPLANT
DEVICE RAD TR BAND REGULAR (VASCULAR PRODUCTS) ×3 IMPLANT
GLIDESHEATH SLEND SS 6F .021 (SHEATH) ×3 IMPLANT
KIT MANI 3VAL PERCEP (MISCELLANEOUS) ×3 IMPLANT
PACK CARDIAC CATH (CUSTOM PROCEDURE TRAY) ×3 IMPLANT
WIRE ROSEN-J .035X260CM (WIRE) ×3 IMPLANT

## 2016-07-11 NOTE — Progress Notes (Signed)
Had a 12-beat run of SVT at 14:21.  Just prior to this BP was 90/54.  Discussed with Dr. Tressia Miners. Considering medication change.

## 2016-07-11 NOTE — Progress Notes (Signed)
[place] at [place] NAME: Deanna Moran    MR#:  [id number]  DATE OF BIRTH:  [birth year]  SUBJECTIVE:  CHIEF COMPLAINT:   Chief Complaint  Patient presents with  . Chest Pain   -   REVIEW OF SYSTEMS:  Review of Systems  Constitutional: Negative for chills, fever and malaise/fatigue.  HENT: Negative for congestion, ear discharge, hearing loss and nosebleeds.   Respiratory: Negative for cough, shortness of breath and wheezing.   Cardiovascular: Positive for chest pain. Negative for palpitations and leg swelling.  Gastrointestinal: Positive for heartburn. Negative for abdominal pain, constipation, diarrhea, nausea and vomiting.  Genitourinary: Negative for dysuria.  Musculoskeletal: Negative for myalgias.  Neurological: Negative for dizziness, sensory change, speech change, focal weakness, seizures and headaches.  Psychiatric/Behavioral: Negative for depression.    DRUG ALLERGIES:  No Known Allergies  VITALS:  Blood pressure (!) 110/51, pulse 72, temperature 98.9 F (37.2 C), temperature source Oral, resp. rate 19, height 5' 9"  (1.753 m), weight 109.3 kg (241 lb), SpO2 100 %.  PHYSICAL EXAMINATION:  Physical Exam  GENERAL:  51 y.o.-year-old patient lying in the bed with no acute distress.  EYES: Pupils equal, round, reactive to light and accommodation. No scleral icterus. Extraocular muscles intact.  HEENT: Head atraumatic, normocephalic. Oropharynx and nasopharynx clear.  NECK:  Supple, no jugular venous distention. No thyroid enlargement, no tenderness.  LUNGS: Normal breath sounds bilaterally, no wheezing, rales,rhonchi or crepitation. No use of accessory muscles of respiration.  CARDIOVASCULAR: S1, S2 normal. No  rubs, or gallops. 2/6 systolic murmur present ABDOMEN: Soft, nontender, nondistended. Bowel sounds present. No organomegaly or mass.  EXTREMITIES: No pedal edema, cyanosis, or clubbing. Right radial cath site with  dressing in place, no bleeding NEUROLOGIC: Cranial nerves II through XII are intact. Muscle strength 5/5 in all extremities. Sensation intact. Gait not checked.  PSYCHIATRIC: The patient is alert and oriented x 3.  SKIN: No obvious rash, lesion, or ulcer.    LABORATORY PANEL:   CBC  Recent Labs Lab 07/10/16 1754  WBC 9.9  HGB 13.1  HCT 39.5  PLT 222   ------------------------------------------------------------------------------------------------------------------  Chemistries   Recent Labs Lab 07/10/16 1754  NA 134*  K 3.7  CL 101  CO2 25  GLUCOSE 110*  BUN 12  CREATININE 0.61  CALCIUM 9.0   ------------------------------------------------------------------------------------------------------------------  Cardiac Enzymes  Recent Labs Lab 07/11/16 0459  TROPONINI 9.47*   ------------------------------------------------------------------------------------------------------------------  RADIOLOGY:  Dg Chest 2 View  Result Date: 07/10/2016 CLINICAL DATA:  Intermittent chest pain over the last 3 weeks. EXAM: CHEST  2 VIEW COMPARISON:  None. FINDINGS: Heart size is normal. Mediastinal shadows are normal. The lungs are clear. No bronchial thickening. No infiltrate, mass, effusion or collapse. Pulmonary vascularity is normal. No bony abnormality. IMPRESSION: Normal chest Electronically Signed   By: Nelson Chimes M.D.   On: 07/10/2016 18:33    EKG:   Orders placed or performed during the hospital encounter of 07/10/16  . ED EKG within 10 minutes  . ED EKG within 10 minutes    ASSESSMENT AND PLAN:   51 year old female with past medical history significant for hypertension, hyperlipidemia and ongoing smoking presents to hospital secondary to chest pain and noted to have NSTEMI.  #1 NSTEMI- presenting with chest pain, on and off symptoms for 3-4 weeks. Late presenting STEMI. -Appreciate cardiology consult. Status post cardiac catheterization today showing subacute  to chronic occlusion of LAD with collaterals. -Continue medical management with aspirin,  Plavix, low-dose Coreg and statin. -Blood pressure is borderline on the lower side. ACE inhibitor or ARB can be started as outpatient. -Cardiac rehabilitation at discharge.  #2 ischemic cardiomyopathy-EF down to 35% on catheterization. -Transthoracic echocardiogram ordered to rule out any apical clot. Continue medical management and follow-up with cardiology as outpatient.  -well compensated and euvolemic at this time. Hold off on starting Lasix. Will need ACE inhibitor or our best outpatient  #3 arrhythmias-brief episode with 12 beat run of SVT, asymptomatic at this time. -Due to low ejection fraction. Monitor. On low-dose Coreg.  #4 tobacco use disorder-needs counseling prior to discharge.  #5 DVT prophylaxis-on subcutaneous heparin  Encourage ambulation. Possible discharge tomorrow     All the records are reviewed and case discussed with Care Management/Social Workerr. Management plans discussed with the patient, family and they are in agreement.  CODE STATUS: Full Code  TOTAL TIME TAKING CARE OF THIS PATIENT: 38 minutes.   POSSIBLE D/C IN 1-2 DAYS, DEPENDING ON CLINICAL CONDITION.   Gladstone Lighter M.D on 07/11/2016 at 3:24 PM  Between 7am to 6pm - Pager - [phone]  After 6pm go to [email or web address] - password [email or web address]  Office  [phone]  CC: Primary care physician; No PCP Per Patient

## 2016-07-11 NOTE — Progress Notes (Signed)
ANTICOAGULATION CONSULT NOTE - Initial Consult  Pharmacy Consult for heparin Indication: chest pain/ACS  No Known Allergies  Patient Measurements: Height: 5\' 9"  (175.3 cm) Weight: 241 lb (109.3 kg) IBW/kg (Calculated) : 66.2 Heparin Dosing Weight: 90.7 kg  Vital Signs: Temp: 98.9 F (37.2 C) (04/23 1800) Temp Source: Oral (04/23 1800) BP: 146/77 (04/23 1800) Pulse Rate: 81 (04/23 1800)  Labs:  Recent Labs (last 2 labs)    Recent Labs  07/10/16 1754  HGB 13.1  HCT 39.5  PLT 222  CREATININE 0.61  TROPONINI 9.37*      Estimated Creatinine Clearance: 108.3 mL/min (by C-G formula based on SCr of 0.61 mg/dL).   Medical History: History reviewed. No pertinent past medical history.  Assessment: 51yo F presents with chest pain. No prior anticoagulation.  Baseline labs ordered.   Goal of Therapy:  Heparin level 0.3-0.7 units/ml Monitor platelets by anticoagulation protocol: Yes   Plan:  Give 4000 units bolus x 1 Start heparin infusion at 1100 units/hr Check anti-Xa level in 6 hours and daily while on heparin Continue to monitor H&H and platelets  4/24 0200 HL 0.17 subtherapeutic. Will rebolus w/ 3300 units IV x 1, increase drip rate to 1300 units/hr and will recheck HL @ 0800.  Thank you for this consult.  Tobie Lords, PharmD, BCPS Clinical Pharmacist 07/11/2016

## 2016-07-11 NOTE — Progress Notes (Signed)
Troponin reported at 10.78. [facility] aware. Pt asymptomatic, no c/o pain or discomfort. Will continue to monitor.

## 2016-07-11 NOTE — Progress Notes (Signed)
I received a phone consultation from Dr. Kerman Passey regarding this patient. She's 51 year old who presented with intermittent chest pain of 3 weeks' duration which got worse recently. I reviewed the EKG which shows deep Q waves from V1 to V4 with 1 mm of ST elevation. The history and EKG are consistent with late presenting anterior ST elevation myocardial infarction. Troponin is already 9. She has minimal symptoms at the present time and thus I recommend treatment with aspirin, statin, unfractionated heparin and a beta blocker if blood pressure tolerates. Recommend cardiac cath in the morning or earlier if she becomes unstable.

## 2016-07-11 NOTE — Progress Notes (Signed)
Pt arrived from ED alert and oriented with son at bedside. No c/o pain, no SOB. Heparin drip at 11 mL/hr. Telemetry and skin verified. [facility] Neurosurgeon. No concerns offered at this time.

## 2016-07-11 NOTE — H&P (View-Only) (Signed)
 Cardiology Consultation Note  Patient ID: Deanna Moran, MRN: [id number], DOB/AGE: 11/23/1965 51 y.o. Admit date: 07/10/2016   Date of Consult: 07/11/2016 Primary Physician: No PCP Per Patient Primary Cardiologist: New to [facility] - consult by End Requesting Physician: Dr. Chen, MD  Chief Complaint: Chest pain Reason for Consult: Late-presenting anterior STEMI  HPI: Deanna Moran is a 51 y.o. female who is being seen today for the evaluation of chest pain at the request of Dr. Chen, MD. Patient has a h/o HTN, HLD, obesity, ongoing tobacco abuse since age 13 at 0.5 to 3 packs daily, chronic back pain, and has not seen a PCP regularly in several years who presented to [facility] on 4/23 with a 3-4 week history of intermittent chest pain/reflux and was found to have a late-presenting anterior STEMI.  Patient has no previously known cardiac history. Over the past 3-4 weeks she has noticed a chest pain/reflux sensation only when laying down at night. Never with exertional symptoms. Pain would not occur during the daylight hours. There was one associated episode of nausea and vomiting, otherwise there was not associated SOB, diaphoresis, palpitations, dizziness, presyncope, or syncope. Over the past 3-4 weeks her pain was rated a 5/10 at its worst. On the afternoon of 4/22 patient developed constant substernal chest pain that was rated a 3/10 and did not radiate. No associated symptoms. This pain was constant throughout the day on 4/22 into the day on 4/23 and into the night of 4/23. Because her pain persisted she presented to [facility].   Upon the patient's arrival to [facility] they were found to have BP 146/77, HR 81 bpm, temp afebrile, oxygen saturation 98% on room air, weight 241 pounds. EKG showed NSR, 74 bpm, anteroseptal infarct, nonspecific st elevation leads V2-V6, CXR showed no acute cardiopulmonary process. Labs showed an initial troponin of 9.37 that trended to 10.78-->9.47. CBC unremarkable. SCr 0.61, K+ 3.7,  LDL 103, TSH normal. She was given full-dose ASA. She was seen by Dr. Arida in the ED who felt she had a late-presenting anterior ST elevation MI. At that time her pain had improved to a 1/10. She was started on a heparin gtt. She did not meet STEMI criteria. Given she became chest pain free in the ED she was admitted to telemetry with plans for LHC today.   Currently, she notes 2-3/10 substernal chest pain that does not radiate. No associated symptoms.   Past Medical History:  Diagnosis Date  . Chronic back pain   . HLD (hyperlipidemia)   . HTN (hypertension)   . Obesity       Most Recent Cardiac Studies: none   Surgical History:  Past Surgical History:  Procedure Laterality Date  . TUBAL LIGATION       Home Meds: Prior to Admission medications   Medication Sig Start Date End Date Taking? Authorizing Provider  prednisoLONE acetate (PRED FORTE) 1 % ophthalmic suspension Place 2 drops into both eyes 4 (four) times daily.   Yes Historical Provider, MD    Inpatient Medications:  . aspirin EC  81 mg Oral Daily  . atorvastatin  40 mg Oral q1800  . metoprolol tartrate  12.5 mg Oral BID  . sodium chloride flush  3 mL Intravenous Q12H  . sodium chloride flush  3 mL Intravenous Q12H   . sodium chloride    . sodium chloride    . [START ON 07/12/2016] sodium chloride 3 mL/kg/hr (07/11/16 0757)   Followed by  . [START ON 07/12/2016] sodium   chloride    . heparin 1,300 Units/hr (07/11/16 0313)    Allergies: No Known Allergies  Social History   Social History  . Marital status: Divorced    Spouse name: N/A  . Number of children: N/A  . Years of education: N/A   Occupational History  . Not on file.   Social History Main Topics  . Smoking status: Current Every Day Smoker    Packs/day: 1.00    Years: 36.00    Types: Cigarettes  . Smokeless tobacco: Never Used  . Alcohol use No  . Drug use: Unknown  . Sexual activity: Not on file   Other Topics Concern  . Not on file    Social History Narrative  . No narrative on file     Family History  Problem Relation Age of Onset  . Heart attack Father      Review of Systems: Review of Systems  Constitutional: Positive for diaphoresis and malaise/fatigue. Negative for chills, fever and weight loss.  HENT: Negative for congestion.   Eyes: Negative for discharge and redness.  Respiratory: Negative for cough, hemoptysis, sputum production, shortness of breath and wheezing.   Cardiovascular: Positive for chest pain. Negative for palpitations, orthopnea, claudication, leg swelling and PND.  Gastrointestinal: Positive for nausea and vomiting. Negative for abdominal pain, blood in stool, heartburn and melena.  Genitourinary: Negative for hematuria.  Musculoskeletal: Negative for falls and myalgias.  Skin: Negative for rash.  Neurological: Positive for weakness. Negative for dizziness, tingling, tremors, sensory change, speech change, focal weakness and loss of consciousness.  Endo/Heme/Allergies: Does not bruise/bleed easily.  Psychiatric/Behavioral: Negative for substance abuse. The patient is not nervous/anxious.   All other systems reviewed and are negative.   Labs:  Recent Labs  07/10/16 1754 07/10/16 2307 07/11/16 0459  TROPONINI 9.37* 10.78* 9.47*   Lab Results  Component Value Date   WBC 9.9 07/10/2016   HGB 13.1 07/10/2016   HCT 39.5 07/10/2016   MCV 94.6 07/10/2016   PLT 222 07/10/2016     Recent Labs Lab 07/10/16 1754  NA 134*  K 3.7  CL 101  CO2 25  BUN 12  CREATININE 0.61  CALCIUM 9.0  GLUCOSE 110*   Lab Results  Component Value Date   CHOL 144 07/11/2016   HDL 31 (L) 07/11/2016   LDLCALC 103 (H) 07/11/2016   TRIG 52 07/11/2016   No results found for: DDIMER  Radiology/Studies:  Dg Chest 2 View  Result Date: 07/10/2016 CLINICAL DATA:  Intermittent chest pain over the last 3 weeks. EXAM: CHEST  2 VIEW COMPARISON:  None. FINDINGS: Heart size is normal. Mediastinal  shadows are normal. The lungs are clear. No bronchial thickening. No infiltrate, mass, effusion or collapse. Pulmonary vascularity is normal. No bony abnormality. IMPRESSION: Normal chest Electronically Signed   By: Mark  Shogry M.D.   On: 07/10/2016 18:33    EKG: Interpreted by me showed:  NSR, 74 bpm, anteroseptal infarct, nonspecific st elevation leads V2-V6 Telemetry: Interpreted by me showed: sinus rhythm  Weights: Filed Weights   07/10/16 1757  Weight: 241 lb (109.3 kg)     Physical Exam: Blood pressure (!) 104/53, pulse 84, temperature 98.7 F (37.1 C), temperature source Oral, resp. rate 12, height 5' 9" (1.753 m), weight 241 lb (109.3 kg), SpO2 92 %. Body mass index is 35.59 kg/m. General: Well developed, well nourished, in no acute distress. Head: Normocephalic, atraumatic, sclera non-icteric, no xanthomas, nares are without discharge.  Neck: Negative for carotid bruits.   JVD not elevated. Lungs: Clear bilaterally to auscultation without wheezes, rales, or rhonchi. Breathing is unlabored. Heart: RRR with S1 S2. No murmurs, rubs, or gallops appreciated. Abdomen: Obese, soft, non-tender, non-distended with normoactive bowel sounds. No hepatomegaly. No rebound/guarding. No obvious abdominal masses. Msk:  Strength and tone appear normal for age. Extremities: No clubbing or cyanosis. No edema. Distal pedal pulses are 2+ and equal bilaterally. Neuro: Alert and oriented X 3. No facial asymmetry. No focal deficit. Moves all extremities spontaneously. Psych:  Responds to questions appropriately with a normal affect.    Assessment and Plan:  Principal Problem:   STEMI (ST elevation myocardial infarction) (HCC) Active Problems:   Essential hypertension   HLD (hyperlipidemia)   Morbid obesity (HCC)   Tobacco abuse    1. Late-presenting anterior ST elevation MI: -Currently with chest pain 2-3/10 -Heparin gtt -ASA -For LHC this morning with Dr. End  -Risks and benefits of  cardiac catheterization have been discussed with the patient including risks of bleeding, bruising, infection, kidney damage, stroke, heart attack, and death. The patient understands these risks and is willing to proceed with the procedure. All questions have been answered and concerns listened to -Metoprolol -Lipid panel as below -A1c pending given hyperglycemia  -Check echo -Cardiac rehab  2. HLD: -Start Lipitor 80 mg daily -Check follow up lipid and liver function in 6-8 weeks  3. HTN: -BP controlled -Metoprolol as above  4. Tobacco abuse: -Cessation is advised  5. Morbid obesity: -Weight loss advised   Signed, Javanna Patin, PA-C [facility] Pager: [phone] 07/11/2016, 8:03 AM  

## 2016-07-11 NOTE — Consult Note (Signed)
Cardiology Consultation Note  Patient ID: Deanna Moran, MRN: [id number], DOB/AGE: Jan 04, 1966 51 y.o. Admit date: 07/10/2016   Date of Consult: 07/11/2016 Primary Physician: No PCP Per Patient Primary Cardiologist: New to [facility] - consult by End Requesting Physician: Dr. Bridgett Larsson, MD  Chief Complaint: Chest pain Reason for Consult: Late-presenting anterior STEMI  HPI: Deanna Moran is a 51 y.o. female who is being seen today for the evaluation of chest pain at the request of Dr. Bridgett Larsson, MD. Patient has a h/o HTN, HLD, obesity, ongoing tobacco abuse since age 10 at 0.5 to 3 packs daily, chronic back pain, and has not seen a PCP regularly in several years who presented to [facility] on 4/23 with a 3-4 week history of intermittent chest pain/reflux and was found to have a late-presenting anterior STEMI.  Patient has no previously known cardiac history. Over the past 3-4 weeks she has noticed a chest pain/reflux sensation only when laying down at night. Never with exertional symptoms. Pain would not occur during the daylight hours. There was one associated episode of nausea and vomiting, otherwise there was not associated SOB, diaphoresis, palpitations, dizziness, presyncope, or syncope. Over the past 3-4 weeks her pain was rated a 5/10 at its worst. On the afternoon of 4/22 patient developed constant substernal chest pain that was rated a 3/10 and did not radiate. No associated symptoms. This pain was constant throughout the day on 4/22 into the day on 4/23 and into the night of 4/23. Because her pain persisted she presented to [facility].   Upon the patient's arrival to [facility] they were found to have BP 146/77, HR 81 bpm, temp afebrile, oxygen saturation 98% on room air, weight 241 pounds. EKG showed NSR, 74 bpm, anteroseptal infarct, nonspecific st elevation leads V2-V6, CXR showed no acute cardiopulmonary process. Labs showed an initial troponin of 9.37 that trended to 10.78-->9.47. CBC unremarkable. SCr 0.61, K+ 3.7,  LDL 103, TSH normal. She was given full-dose ASA. She was seen by Dr. Fletcher Anon in the ED who felt she had a late-presenting anterior ST elevation MI. At that time her pain had improved to a 1/10. She was started on a heparin gtt. She did not meet STEMI criteria. Given she became chest pain free in the ED she was admitted to telemetry with plans for [facility] today.   Currently, she notes 2-3/10 substernal chest pain that does not radiate. No associated symptoms.   Past Medical History:  Diagnosis Date  . Chronic back pain   . HLD (hyperlipidemia)   . HTN (hypertension)   . Obesity       Most Recent Cardiac Studies: none   Surgical History:  Past Surgical History:  Procedure Laterality Date  . TUBAL LIGATION       Home Meds: Prior to Admission medications   Medication Sig Start Date End Date Taking? Authorizing Provider  prednisoLONE acetate (PRED FORTE) 1 % ophthalmic suspension Place 2 drops into both eyes 4 (four) times daily.   Yes Historical Provider, MD    Inpatient Medications:  . aspirin EC  81 mg Oral Daily  . atorvastatin  40 mg Oral q1800  . metoprolol tartrate  12.5 mg Oral BID  . sodium chloride flush  3 mL Intravenous Q12H  . sodium chloride flush  3 mL Intravenous Q12H   . sodium chloride    . sodium chloride    . [START ON 07/12/2016] sodium chloride 3 mL/kg/hr (07/11/16 0757)   Followed by  . [START ON 07/12/2016] sodium  chloride    . heparin 1,300 Units/hr (07/11/16 0313)    Allergies: No Known Allergies  Social History   Social History  . Marital status: Divorced    Spouse name: N/A  . Number of children: N/A  . Years of education: N/A   Occupational History  . Not on file.   Social History Main Topics  . Smoking status: Current Every Day Smoker    Packs/day: 1.00    Years: 36.00    Types: Cigarettes  . Smokeless tobacco: Never Used  . Alcohol use No  . Drug use: Unknown  . Sexual activity: Not on file   Other Topics Concern  . Not on file    Social History Narrative  . No narrative on file     Family History  Problem Relation Age of Onset  . Heart attack Father      Review of Systems: Review of Systems  Constitutional: Positive for diaphoresis and malaise/fatigue. Negative for chills, fever and weight loss.  HENT: Negative for congestion.   Eyes: Negative for discharge and redness.  Respiratory: Negative for cough, hemoptysis, sputum production, shortness of breath and wheezing.   Cardiovascular: Positive for chest pain. Negative for palpitations, orthopnea, claudication, leg swelling and PND.  Gastrointestinal: Positive for nausea and vomiting. Negative for abdominal pain, blood in stool, heartburn and melena.  Genitourinary: Negative for hematuria.  Musculoskeletal: Negative for falls and myalgias.  Skin: Negative for rash.  Neurological: Positive for weakness. Negative for dizziness, tingling, tremors, sensory change, speech change, focal weakness and loss of consciousness.  Endo/Heme/Allergies: Does not bruise/bleed easily.  Psychiatric/Behavioral: Negative for substance abuse. The patient is not nervous/anxious.   All other systems reviewed and are negative.   Labs:  Recent Labs  07/10/16 1754 07/10/16 2307 07/11/16 0459  TROPONINI 9.37* 10.78* 9.47*   Lab Results  Component Value Date   WBC 9.9 07/10/2016   HGB 13.1 07/10/2016   HCT 39.5 07/10/2016   MCV 94.6 07/10/2016   PLT 222 07/10/2016     Recent Labs Lab 07/10/16 1754  NA 134*  K 3.7  CL 101  CO2 25  BUN 12  CREATININE 0.61  CALCIUM 9.0  GLUCOSE 110*   Lab Results  Component Value Date   CHOL 144 07/11/2016   HDL 31 (L) 07/11/2016   LDLCALC 103 (H) 07/11/2016   TRIG 52 07/11/2016   No results found for: DDIMER  Radiology/Studies:  Dg Chest 2 View  Result Date: 07/10/2016 CLINICAL DATA:  Intermittent chest pain over the last 3 weeks. EXAM: CHEST  2 VIEW COMPARISON:  None. FINDINGS: Heart size is normal. Mediastinal  shadows are normal. The lungs are clear. No bronchial thickening. No infiltrate, mass, effusion or collapse. Pulmonary vascularity is normal. No bony abnormality. IMPRESSION: Normal chest Electronically Signed   By: Nelson Chimes M.D.   On: 07/10/2016 18:33    EKG: Interpreted by me showed:  NSR, 74 bpm, anteroseptal infarct, nonspecific st elevation leads V2-V6 Telemetry: Interpreted by me showed: sinus rhythm  Weights: Filed Weights   07/10/16 1757  Weight: 241 lb (109.3 kg)     Physical Exam: Blood pressure (!) 104/53, pulse 84, temperature 98.7 F (37.1 C), temperature source Oral, resp. rate 12, height 5' 9"  (1.753 m), weight 241 lb (109.3 kg), SpO2 92 %. Body mass index is 35.59 kg/m. General: Well developed, well nourished, in no acute distress. Head: Normocephalic, atraumatic, sclera non-icteric, no xanthomas, nares are without discharge.  Neck: Negative for carotid bruits.  JVD not elevated. Lungs: Clear bilaterally to auscultation without wheezes, rales, or rhonchi. Breathing is unlabored. Heart: RRR with S1 S2. No murmurs, rubs, or gallops appreciated. Abdomen: Obese, soft, non-tender, non-distended with normoactive bowel sounds. No hepatomegaly. No rebound/guarding. No obvious abdominal masses. Msk:  Strength and tone appear normal for age. Extremities: No clubbing or cyanosis. No edema. Distal pedal pulses are 2+ and equal bilaterally. Neuro: Alert and oriented X 3. No facial asymmetry. No focal deficit. Moves all extremities spontaneously. Psych:  Responds to questions appropriately with a normal affect.    Assessment and Plan:  Principal Problem:   STEMI (ST elevation myocardial infarction) ([facility]) Active Problems:   Essential hypertension   HLD (hyperlipidemia)   Morbid obesity (HCC)   Tobacco abuse    1. Late-presenting anterior ST elevation MI: -Currently with chest pain 2-3/10 -Heparin gtt -ASA -For LHC this morning with Dr. Saunders Revel  -Risks and benefits of  cardiac catheterization have been discussed with the patient including risks of bleeding, bruising, infection, kidney damage, stroke, heart attack, and death. The patient understands these risks and is willing to proceed with the procedure. All questions have been answered and concerns listened to -Metoprolol -Lipid panel as below -A1c pending given hyperglycemia  -Check echo -Cardiac rehab  2. HLD: -Start Lipitor 80 mg daily -Check follow up lipid and liver function in 6-8 weeks  3. HTN: -BP controlled -Metoprolol as above  4. Tobacco abuse: -Cessation is advised  5. Morbid obesity: -Weight loss advised   Signed, Christell Faith, PA-C [place] Pager: [phone] 07/11/2016, 8:03 AM

## 2016-07-11 NOTE — Interval H&P Note (Signed)
History and Physical Interval Note:  07/11/2016 8:13 AM  [name]  has presented today for cardiac catheterization, with the diagnosis of acute MI. The various methods of treatment have been discussed with the patient and family. After consideration of risks, benefits and other options for treatment, the patient has consented to  Procedure(s): Left Heart Cath and Coronary Angiography (N/A) as a surgical intervention .  The patient's history has been reviewed, patient examined, no change in status, stable for surgery.  I have reviewed the patient's chart and labs.  Questions were answered to the patient's satisfaction.  Cath Lab Visit (complete for each Cath Lab visit)  Clinical Evaluation Leading to the Procedure:   ACS: Yes.    Non-ACS:  N/A  Deanna Moran

## 2016-07-11 NOTE — Progress Notes (Signed)
Report to jancey.  Keep right wrist elevated on pillow above the heart for today.  Watch right wrist for evidence of bleeding or hematoma.. If bleeding or hematoma noted, hold pressure over the site for at least 15 minutes and notify the physician.  No blending or flexing of the wrist--no lifting for the remainder of the day or for 2 weeks after your procedure. Notify the physician for evidence of infection or if you get a temperature.

## 2016-07-12 ENCOUNTER — Telehealth: Payer: Self-pay | Admitting: *Deleted

## 2016-07-12 ENCOUNTER — Inpatient Hospital Stay (HOSPITAL_COMMUNITY)
Admit: 2016-07-12 | Discharge: 2016-07-12 | Disposition: A | Discharge status: Home / Self Care | Payer: Managed Care, Other (non HMO) | Attending: Internal Medicine | Admitting: Internal Medicine

## 2016-07-12 DIAGNOSIS — E782 Mixed hyperlipidemia: Secondary | ICD-10-CM

## 2016-07-12 DIAGNOSIS — I2109 ST elevation (STEMI) myocardial infarction involving other coronary artery of anterior wall: Secondary | ICD-10-CM

## 2016-07-12 LAB — BASIC METABOLIC PANEL
Anion gap: 5 (ref 5–15)
BUN: 9 mg/dL (ref 6–20)
CALCIUM: 8.4 mg/dL — AB (ref 8.9–10.3)
CO2: 28 mmol/L (ref 22–32)
CREATININE: 0.76 mg/dL (ref 0.44–1.00)
Chloride: 103 mmol/L (ref 101–111)
GFR calc Af Amer: >60 mL/min (ref >60)
GLUCOSE: 99 mg/dL (ref 65–99)
Potassium: 3.5 mmol/L (ref 3.5–5.1)
SODIUM: 136 mmol/L (ref 135–145)

## 2016-07-12 LAB — HEMOGLOBIN A1C
Hgb A1c MFr Bld: 5.7 % — ABNORMAL HIGH (ref 4.8–5.6)
MEAN PLASMA GLUCOSE: 117 mg/dL

## 2016-07-12 LAB — ECHOCARDIOGRAM COMPLETE
Height: 69 in
WEIGHTICAEL: 3856 [oz_av]

## 2016-07-12 LAB — HIV ANTIBODY (ROUTINE TESTING W REFLEX): HIV SCREEN 4TH GENERATION: NONREACTIVE

## 2016-07-12 MED ORDER — ASPIRIN 81 MG PO TBEC: 81.0000 mg | DELAYED_RELEASE_TABLET | Freq: Every day | ORAL | 3 refills | Status: DC

## 2016-07-12 MED ORDER — ATORVASTATIN CALCIUM 80 MG PO TABS: 80.0000 mg | ORAL_TABLET | Freq: Every day | ORAL | 3 refills | Status: DC

## 2016-07-12 MED ORDER — NITROGLYCERIN 0.4 MG SL SUBL: 0.4000 mg | SUBLINGUAL_TABLET | SUBLINGUAL | 2 refills | Status: DC | PRN

## 2016-07-12 MED ORDER — CLOPIDOGREL BISULFATE 75 MG PO TABS: 75.0000 mg | ORAL_TABLET | Freq: Every day | ORAL | 2 refills | Status: DC

## 2016-07-12 MED ORDER — CARVEDILOL 3.125 MG PO TABS: 3.1250 mg | ORAL_TABLET | Freq: Two times a day (BID) | ORAL | 2 refills | Status: DC

## 2016-07-12 NOTE — Discharge Summary (Signed)
[facility] NAME: Deanna Moran    MR#:  [id number]  DATE OF BIRTH:  Oct 17, 1965  DATE OF ADMISSION:  07/10/2016   ADMITTING PHYSICIAN: Demetrios Loll, MD  DATE OF DISCHARGE: 07/12/16  PRIMARY CARE PHYSICIAN: No PCP Per Patient   ADMISSION DIAGNOSIS:   NSTEMI (non-ST elevated myocardial infarction) ([place]) [I21.4]  DISCHARGE DIAGNOSIS:   Principal Problem:   STEMI (ST elevation myocardial infarction) ([place]) Active Problems:   Essential hypertension   HLD (hyperlipidemia)   Morbid obesity ([place])   Tobacco abuse   SECONDARY DIAGNOSIS:   Past Medical History:  Diagnosis Date  . Chronic back pain   . HLD (hyperlipidemia)   . HTN (hypertension)   . Obesity     HOSPITAL COURSE:   51 year old female with past medical history significant for hypertension, hyperlipidemia and ongoing smoking presents to hospital secondary to chest pain and noted to have NSTEMI.  #1 NSTEMI- presenting with chest pain, on and off symptoms for 3-4 weeks. Late presenting STEMI. -Appreciate cardiology consult. Status post cardiac catheterization showing subacute to chronic occlusion of LAD with collaterals. -Continue medical management with aspirin, Plavix, low-dose Coreg and statin. -Blood pressure is borderline on the lower side. ACE inhibitor or ARB can be started as outpatient. -Cardiac rehabilitation at discharge.  #2 ischemic cardiomyopathy-EF down to 35% on catheterization. -Transthoracic echocardiogram Negative for apical clot. Continue medical management and follow-up with cardiology as outpatient.  -well compensated and euvolemic at this time. Hold off on starting Lasix. Will need ACE inhibitor or ARB as outpatient - advised fluid restriction  #3 arrhythmias-brief episode with 12 beat run of SVT while inpatient, asymptomatic at this time. -Due to low ejection fraction. Monitor. On low-dose Coreg. None further  #4 tobacco use  disorder-counseling prior to discharge.   Discharge today.   DISCHARGE CONDITIONS:   Guarded  CONSULTS OBTAINED:   Treatment Team:  Wellington Hampshire, MD Nelva Bush, MD  DRUG ALLERGIES:   No Known Allergies DISCHARGE MEDICATIONS:   Allergies as of 07/12/2016   No Known Allergies     Medication List    TAKE these medications   aspirin 81 MG EC tablet Take 1 tablet (81 mg total) by mouth daily. Start taking on:  07/13/2016   atorvastatin 80 MG tablet Commonly known as:  LIPITOR Take 1 tablet (80 mg total) by mouth daily at 6 PM.   carvedilol 3.125 MG tablet Commonly known as:  COREG Take 1 tablet (3.125 mg total) by mouth 2 (two) times daily with a meal.   clopidogrel 75 MG tablet Commonly known as:  PLAVIX Take 1 tablet (75 mg total) by mouth daily with breakfast. Start taking on:  07/13/2016   nitroGLYCERIN 0.4 MG SL tablet Commonly known as:  NITROSTAT Place 1 tablet (0.4 mg total) under the tongue every 5 (five) minutes x 3 doses as needed for chest pain.   prednisoLONE acetate 1 % ophthalmic suspension Commonly known as:  PRED FORTE Place 2 drops into both eyes 4 (four) times daily.        DISCHARGE INSTRUCTIONS:   1. PCP follow-up appointments 2 weeks 2. Cardiology follow-up in 1 week  DIET:   Cardiac diet  ACTIVITY:   Activity as tolerated  OXYGEN:   Home Oxygen: No.  Oxygen Delivery: room air  DISCHARGE LOCATION:   home   If you experience worsening of your admission symptoms, develop shortness of breath, life threatening emergency, suicidal or homicidal  thoughts you must seek medical attention immediately by calling 911 or calling your MD immediately  if symptoms less severe.  You Must read complete instructions/literature along with all the possible adverse reactions/side effects for all the Medicines you take and that have been prescribed to you. Take any new Medicines after you have completely understood and accpet all the  possible adverse reactions/side effects.   Please note  You were cared for by a hospitalist during your hospital stay. If you have any questions about your discharge medications or the care you received while you were in the hospital after you are discharged, you can call the unit and asked to speak with the hospitalist on call if the hospitalist that took care of you is not available. Once you are discharged, your primary care physician will handle any further medical issues. Please note that NO REFILLS for any discharge medications will be authorized once you are discharged, as it is imperative that you return to your primary care physician (or establish a relationship with a primary care physician if you do not have one) for your aftercare needs so that they can reassess your need for medications and monitor your lab values.    On the day of Discharge:  VITAL SIGNS:   Blood pressure (!) 89/34, pulse 70, temperature 98.2 F (36.8 C), temperature source Oral, resp. rate 16, height 5' 9"  (1.753 m), weight 109.3 kg (241 lb), SpO2 92 %.  PHYSICAL EXAMINATION:    GENERAL:  51 y.o.-year-old patient lying in the bed with no acute distress.  EYES: Pupils equal, round, reactive to light and accommodation. No scleral icterus. Extraocular muscles intact.  HEENT: Head atraumatic, normocephalic. Oropharynx and nasopharynx clear.  NECK:  Supple, no jugular venous distention. No thyroid enlargement, no tenderness.  LUNGS: Normal breath sounds bilaterally, no wheezing, rales,rhonchi or crepitation. No use of accessory muscles of respiration.  CARDIOVASCULAR: S1, S2 normal. No  rubs, or gallops. 2/6 systolic murmur present ABDOMEN: Soft, nontender, nondistended. Bowel sounds present. No organomegaly or mass.  EXTREMITIES: No pedal edema, cyanosis, or clubbing. Right radial cath site with dressing in place, no bleeding NEUROLOGIC: Cranial nerves II through XII are intact. Muscle strength 5/5 in all  extremities. Sensation intact. Gait not checked.  PSYCHIATRIC: The patient is alert and oriented x 3.  SKIN: No obvious rash, lesion, or ulcer.   DATA REVIEW:   CBC  Recent Labs Lab 07/10/16 1754  WBC 9.9  HGB 13.1  HCT 39.5  PLT 222    Chemistries   Recent Labs Lab 07/12/16 0432  NA 136  K 3.5  CL 103  CO2 28  GLUCOSE 99  BUN 9  CREATININE 0.76  CALCIUM 8.4*     Microbiology Results  No results found for this or any previous visit.  RADIOLOGY:  No results found.   Management plans discussed with the patient, family and they are in agreement.  CODE STATUS:     Code Status Orders        Start     Ordered   07/10/16 2207  Full code  Continuous     07/10/16 2206    Code Status History    Date Active Date Inactive Code Status Order ID Comments User Context   This patient has a current code status but no historical code status.      TOTAL TIME TAKING CARE OF THIS PATIENT: 37  minutes.    Gladstone Lighter M.D on 07/12/2016 at 2:11 PM  Between 7am  to 6pm - Pager - [phone]  After 6pm go to [email or web address] - [employer] [facility]  Office  [phone]  CC: Primary care physician; No PCP Per Patient   Note: This dictation was prepared with Dragon dictation along with smaller phrase technology. Any transcriptional errors that result from this process are unintentional.

## 2016-07-12 NOTE — Telephone Encounter (Signed)
-----   Message from Blain Pais sent at 07/12/2016 11:48 AM EDT ----- Regarding: tcm/ph 5/2 2:20 Dr. Saunders Revel

## 2016-07-12 NOTE — Progress Notes (Signed)
Patient Name: Deanna Moran Date of Encounter: 07/12/2016  Primary Cardiologist: New to [facility] - consult by End  Hospital Problem List     Principal Problem:   STEMI (ST elevation myocardial infarction) [facility]) Active Problems:   Essential hypertension   HLD (hyperlipidemia)   Morbid obesity ([place])   Tobacco abuse     Subjective   No further chest pain. LHC on 4/24 showed one-vessel CAD with subacute vs chronic occlusion of ostial LAD with left-to-left and right-to-left collaterals. LVEF by LV gram 30-35% with mid/apical anterior, apical and apical inferior HK. Has ambulated without issues. Tolerating medications without issues. Some reflux overnight, none currently.   Inpatient Medications    Scheduled Meds: . aspirin EC  81 mg Oral Daily  . atorvastatin  80 mg Oral q1800  . carvedilol  3.125 mg Oral BID WC  . clopidogrel  75 mg Oral Q breakfast  . heparin  5,000 Units Subcutaneous Q8H  . sodium chloride flush  3 mL Intravenous Q12H   Continuous Infusions: . sodium chloride    . sodium chloride     PRN Meds: sodium chloride, sodium chloride, acetaminophen, nitroGLYCERIN, ondansetron (ZOFRAN) IV, sodium chloride flush   Vital Signs    Vitals:   07/11/16 1232 07/11/16 1417 07/11/16 1511 07/11/16 1951  BP: 126/65 (!) 90/54 (!) 110/51 (!) 91/47  Pulse: 76 73 72 74  Resp:  19  16  Temp:  98.9 F (37.2 C)  99.1 F (37.3 C)  TempSrc:  Oral  Oral  SpO2: 99% 98% 100% 96%  Weight:      Height:        Intake/Output Summary (Last 24 hours) at 07/12/16 4580 Last data filed at 07/11/16 1848  Gross per 24 hour  Intake              600 ml  Output                0 ml  Net              600 ml   Filed Weights   07/10/16 1757  Weight: 241 lb (109.3 kg)    Physical Exam    GEN: Well nourished, well developed, in no acute distress.  HEENT: Grossly normal.  Neck: Supple, no JVD, carotid bruits, or masses. Cardiac: RRR, no murmurs, rubs, or gallops. No clubbing,  cyanosis, edema.  Radials/DP/PT 2+ and equal bilaterally. Right radial cath site without bleeding, bruising, swelling, erythema, or TTP. Radial pulse 2+. Respiratory:  Respirations regular and unlabored, clear to auscultation bilaterally. GI: Obese, soft, nontender, nondistended, BS + x 4. MS: no deformity or atrophy. Skin: warm and dry, no rash. Neuro:  Strength and sensation are intact. Psych: AAOx3.  Normal affect.  Labs    CBC  Recent Labs  07/10/16 1754  WBC 9.9  HGB 13.1  HCT 39.5  MCV 94.6  PLT 998   Basic Metabolic Panel  Recent Labs  07/10/16 1754 07/12/16 0432  NA 134* 136  K 3.7 3.5  CL 101 103  CO2 25 28  GLUCOSE 110* 99  BUN 12 9  CREATININE 0.61 0.76  CALCIUM 9.0 8.4*   Liver Function Tests No results for input(s): AST, ALT, ALKPHOS, BILITOT, PROT, ALBUMIN in the last 72 hours. No results for input(s): LIPASE, AMYLASE in the last 72 hours. Cardiac Enzymes  Recent Labs  07/10/16 1754 07/10/16 2307 07/11/16 0459  TROPONINI 9.37* 10.78* 9.47*   BNP Invalid input(s): POCBNP D-Dimer  No results for input(s): DDIMER in the last 72 hours. Hemoglobin A1C  Recent Labs  07/10/16 1754  HGBA1C 5.7*   Fasting Lipid Panel  Recent Labs  07/11/16 0459  CHOL 144  HDL 31*  LDLCALC 103*  TRIG 52  CHOLHDL 4.6   Thyroid Function Tests  Recent Labs  07/11/16 0459  TSH 2.089    Telemetry    Sinus rhythm, 70s bpm - Personally Reviewed  ECG    n/a - Personally Reviewed  Radiology    Dg Chest 2 View  Result Date: 07/10/2016 CLINICAL DATA:  Intermittent chest pain over the last 3 weeks. EXAM: CHEST  2 VIEW COMPARISON:  None. FINDINGS: Heart size is normal. Mediastinal shadows are normal. The lungs are clear. No bronchial thickening. No infiltrate, mass, effusion or collapse. Pulmonary vascularity is normal. No bony abnormality. IMPRESSION: Normal chest Electronically Signed   By: Nelson Chimes M.D.   On: 07/10/2016 18:33    Cardiac Studies    LHC 07/11/2016: Conclusions: 1. Significant single-vessel coronary artery disease with subacute vs. chronic total occlusion of ostial LAD. Distal vessel fills by left-to-left and right-to-left collaterals. 2. Mild to moderate, non-obstructive disease involving the LCx and RCA. 3. Upper normal left ventricular filling pressure. 4. Moderate to severely reduced LV contraction with mid/apical anterior, apical, and apical inferior akinesis. LVEF 30-35%.  Recommendations: 1. Medical therapy including aspirin and clopidogrel for 12 months. 2. Low-dose beta-blocker, to be uptitrated as heart rate and blood pressure allow. 3. Aggressive secondary prevention, including high-intensity statin therapy and smoking cessation. 4. Add ACEI/ARB as blood pressure allows prior to discharge. 5. Follow-up echocardiogram with particular attention to evaluate for LV apical thrombus.  Nelva Bush, MD [facility] Pager: [phone]   TTE pending  Patient Profile     51 y.o. female with history of HTN, HLD, obesity, ongoing tobacco abuse since age 30 at 0.5 to 3 packs daily, chronic back pain, and has not seen a PCP regularly in several years who presented to [facility] on 4/23 with a 3-4 week history of intermittent chest pain/reflux and was found to have a late-presenting anterior STEMI.  Assessment & Plan    1. Late-presenting anterior ST elevation MI: -Currently without chest pain -Troponin peaked at 10.78, down trending -LHC with one-vessel CAD with subacute vs chronic occlusion of ostial LAD with left-to-left and right-to-left collaterals -Medical management advised -ASA 81 mg daily and Plavix 75 mg daily (will need 2 Rxs for medications - local pharmacy and mail order) -Coreg 3.125 mg bid as BP allows -Lipitor -Lipid panel as below -A1c 5.7%  -Echo pending to evaluate for LV thrombus -Cardiac rehab  2. HLD: -Lipitor 80 mg daily -Check follow up lipid and liver function in 6-8  weeks  3. HTN: -BP soft -Coreg as above as BP allows  4. Tobacco abuse: -Cessation is advised  5. Morbid obesity: -Weight loss advised  Signed, Christell Faith, PA-C [place] Pager: [phone] 07/12/2016, 7:12 AM   Attending Note Patient seen and examined, agree with detailed note above,  Patient presentation and plan discussed on rounds.   Doing well as morning, denies any significant shortness of breath On discussing her fluid intake she does report having High fluid intake at home, 2 large cups of coffee, 2 large jugs of other fluids in the day Walked around the unit, denies any symptoms  Discussed medications with her in detail, reason for each medication  On physical exam lungs are clear to auscultation, no  JVP, heart sounds regular with no murmurs appreciated, abdomen soft nontender, no significant lower extremity edema  Echocardiogram reviewed with her in detail,  Anterior wall and apical wall as well as anteroseptal wall hypokinesis Moderately depressed ejection fraction 35-40%  ----MI of LAD, chronically occluded Not amenable to intervention given delayed presentation Would continue aspirin, Plavix, Lipitor Blood pressure is very low, will check orthostatics If positive orthostatics even on Coreg 3.125 mill grams twice a day we may have to decrease dose down to once a day  Will need close outpatient follow-up with Dr. Saunders Revel who performed her cardiac catheterization   Greater than 50% was spent in counseling and coordination of care with patient Total encounter time 25 minutes or more   Signed: Esmond Plants  M.D., Ph.D. [facility]

## 2016-07-12 NOTE — Telephone Encounter (Signed)
Patient contacted regarding discharge from [facility].  Patient understands to follow up with provider Dr. Saunders Revel on 07/19/16 at 2:20 PM at [facility]. Patient understands discharge instructions? Pt still admitted Patient understands medications and regiment? Pt still admitted Patient understands to bring all medications to this visit? Yes  Patient still admitted at this time but states that she is hoping to go home today. Reviewed appointment information with her and instructed her to call back if she has any questions regarding her discharge instructions or medications. She verbalized understanding of all information and had no further questions at this time.

## 2016-07-12 NOTE — Progress Notes (Signed)
Patient discharged home as ordered,instructions explained and well understood,follow up appointment made,escorted by son and Engineer, manufacturing via wheel chair.

## 2016-07-12 NOTE — Care Management (Signed)
No discharge needs identified by members of the care team.  Is not discharging on cost prohibitive antiplatelet medication

## 2016-07-12 NOTE — Progress Notes (Signed)
*  PRELIMINARY RESULTS* Echocardiogram 2D Echocardiogram has been performed.  Sherrie Sport 07/12/2016, 9:35 AM

## 2016-07-12 NOTE — Discharge Instructions (Signed)
Low salt diet Fluid restriction to 1 coffee/day and also not more than 30 ounces fluid per day

## 2016-07-13 ENCOUNTER — Telehealth: Payer: Self-pay | Admitting: Internal Medicine

## 2016-07-13 NOTE — Telephone Encounter (Signed)
Received records request [employer], forwarded to [facility] for processing.

## 2016-07-18 ENCOUNTER — Telehealth: Payer: Self-pay | Admitting: Internal Medicine

## 2016-07-18 NOTE — Telephone Encounter (Signed)
Received records request [employer], forwarded to [facility] for processing.

## 2016-07-19 ENCOUNTER — Encounter: Payer: Self-pay | Admitting: *Deleted

## 2016-07-19 ENCOUNTER — Encounter: Payer: Self-pay | Admitting: Internal Medicine

## 2016-07-19 ENCOUNTER — Ambulatory Visit (INDEPENDENT_AMBULATORY_CARE_PROVIDER_SITE_OTHER): Payer: Managed Care, Other (non HMO) | Admitting: Internal Medicine

## 2016-07-19 VITALS — BP 122/72 | Ht 69.0 in | Wt 241.2 lb

## 2016-07-19 DIAGNOSIS — I2510 Atherosclerotic heart disease of native coronary artery without angina pectoris: Secondary | ICD-10-CM | POA: Diagnosis not present

## 2016-07-19 DIAGNOSIS — I10 Essential (primary) hypertension: Secondary | ICD-10-CM

## 2016-07-19 DIAGNOSIS — E785 Hyperlipidemia, unspecified: Secondary | ICD-10-CM

## 2016-07-19 DIAGNOSIS — Z79899 Other long term (current) drug therapy: Secondary | ICD-10-CM | POA: Diagnosis not present

## 2016-07-19 DIAGNOSIS — I252 Old myocardial infarction: Secondary | ICD-10-CM | POA: Diagnosis not present

## 2016-07-19 MED ORDER — LOSARTAN POTASSIUM 25 MG PO TABS: 25.0000 mg | ORAL_TABLET | Freq: Every day | ORAL | 3 refills | Status: DC

## 2016-07-19 NOTE — Progress Notes (Signed)
Follow-up Outpatient Visit Date: 07/19/2016  Primary Care Provider: No PCP Per Patient No address on file  Chief Complaint: Follow-up recent hospitalization for NSTEMI  HPI:  Deanna Moran is a 51 y.o. year-old female with history of CAD with recent NSTEMI (06/2016), ischemic cardiomyopathy, hypertension, hyperlipidemia, and tobacco use, who presents for follow-up of coronary artery disease. She underwent urgent left heart catheterization, demonstrating subacute versus chronic total occlusion of the proximal LAD. Medical management was advised. Since leaving the hospital one week ago, she has felt well with the exception of exertional dyspnea going up stairs. She denies chest pain, palpitations, lightheadedness, orthopnea, PND, and edema. She is tolerating her medications well, including dual antiplatelet therapy with aspirin and clopidogrel. She is planning to travel to [place] to see her In Late June. She also wonders when she can return to work at [employer].  --------------------------------------------------------------------------------------------------  Cardiovascular History & Procedures: Cardiovascular Problems:  Coronary artery disease status post NSTEMI  Ischemic cardiomyopathy  Risk Factors:  Known coronary artery disease, hypertension, hyperlipidemia, and tobacco use  Cath/PCI:  LHC (07/11/16): Significant single vessel CAD with subacute versus chronic total occlusion of the ostial LAD with left to left and right-to-left collaterals. Mild to moderate nonobstructive CAD involving the LCx and RCA. LVEF 30-35% with anterior and apical wall motion abnormality.  CV Surgery:  None  EP Procedures and Devices:  None  Non-Invasive Evaluation(s):  TTE (07/12/16): Technically difficult study. LVEF 40-45% with anterior hypokinesis and grade 1 diastolic dysfunction. Mild MR. Mild left atrial enlargement. Normal RV size and function.  Recent CV Pertinent Labs: Lab Results    Component Value Date   CHOL 144 07/11/2016   HDL 31 (L) 07/11/2016   LDLCALC 103 (H) 07/11/2016   TRIG 52 07/11/2016   CHOLHDL 4.6 07/11/2016   INR 1.00 07/10/2016   K 3.5 07/12/2016   BUN 9 07/12/2016   CREATININE 0.76 07/12/2016    Past medical and surgical history were reviewed and updated in EPIC.  Outpatient Encounter Prescriptions as of 07/19/2016  Medication Sig  . aspirin EC 81 MG EC tablet Take 1 tablet (81 mg total) by mouth daily.  Marland Kitchen atorvastatin (LIPITOR) 80 MG tablet Take 1 tablet (80 mg total) by mouth daily at 6 PM.  . carvedilol (COREG) 3.125 MG tablet Take 1 tablet (3.125 mg total) by mouth 2 (two) times daily with a meal.  . clopidogrel (PLAVIX) 75 MG tablet Take 1 tablet (75 mg total) by mouth daily with breakfast.  . naproxen sodium (ANAPROX) 220 MG tablet Take 220 mg by mouth as needed.  . nitroGLYCERIN (NITROSTAT) 0.4 MG SL tablet Place 1 tablet (0.4 mg total) under the tongue every 5 (five) minutes x 3 doses as needed for chest pain.  . prednisoLONE acetate (PRED FORTE) 1 % ophthalmic suspension Place 2 drops into both eyes 4 (four) times daily.   No facility-administered encounter medications on file as of 07/19/2016.     Allergies: Patient has no known allergies.  Social History   Social History  . Marital status: Divorced    Spouse name: N/A  . Number of children: N/A  . Years of education: N/A   Occupational History  . Not on file.   Social History Main Topics  . Smoking status: Current Every Day Smoker    Packs/day: 0.50    Years: 36.00    Types: Cigarettes  . Smokeless tobacco: Never Used  . Alcohol use No  . Drug use: Unknown  . Sexual activity:  Not on file   Other Topics Concern  . Not on file   Social History Narrative  . No narrative on file    Family History  Problem Relation Age of Onset  . Heart attack Father   . Anuerysm Brother     brain    Review of Systems: Patient notes an ulcer that broke along the medial aspect  of the right forearm not near radial arteriotomy site). Otherwise, a 12-system review of systems was performed and was negative except as noted in the HPI.  --------------------------------------------------------------------------------------------------  Physical Exam: BP 122/72 (BP Location: Right Arm, Patient Position: Sitting, Cuff Size: Normal)   Ht 5' 9"  (1.753 m)   Wt 241 lb 4 oz (109.4 kg)   BMI 35.63 kg/m   General:  Obese woman, seated comfortably in the exam room. HEENT: No conjunctival pallor or scleral icterus.  Moist mucous membranes.  OP clear. Poor dentition. Neck: Supple without lymphadenopathy, thyromegaly, JVD, or HJR.  No carotid bruit. Lungs: Normal work of breathing.  Clear to auscultation bilaterally without wheezes or crackles. Heart: Regular rate and rhythm without murmurs, rubs, or gallops.  Non-displaced PMI. Abd: Bowel sounds present.  Soft, NT/ND without hepatosplenomegaly Ext: No lower extremity edema.  Radial, PT, and DP pulses are 2+ bilaterally. Right radial arteriotomy is healing well. Skin: warm and dry without rash  EKG:  Normal sinus rhythm with low voltage QRS, incomplete right bundle branch block, and anterolateral Q waves.   Lab Results  Component Value Date   WBC 9.9 07/10/2016   HGB 13.1 07/10/2016   HCT 39.5 07/10/2016   MCV 94.6 07/10/2016   PLT 222 07/10/2016    Lab Results  Component Value Date   NA 136 07/12/2016   K 3.5 07/12/2016   CL 103 07/12/2016   CO2 28 07/12/2016   BUN 9 07/12/2016   CREATININE 0.76 07/12/2016   GLUCOSE 99 07/12/2016    Lab Results  Component Value Date   CHOL 144 07/11/2016   HDL 31 (L) 07/11/2016   LDLCALC 103 (H) 07/11/2016   TRIG 52 07/11/2016   CHOLHDL 4.6 07/11/2016    --------------------------------------------------------------------------------------------------  ASSESSMENT AND PLAN: Coronary artery disease status post recent NSTEMI No recurrent chest pain. Patient has fatigue  that is likely multifactorial, including medications and systolic heart failure. We will continue her current medication regimen for secondary prevention, including dual antiplatelet therapy with aspirin and clopidogrel for 12 months. She will also remain on high intensity statin therapy and carvedilol. We will refer her to cardiac rehabilitation. The patient was also advised to stop smoking.  Chronic systolic heart failure will secondary to ischemic cardiomyopathy Deanna Moran appears euvolemic and well compensated with NYHA class II-III heart failure symptoms. She is tolerating low-dose carvedilol well. I am hesitant to uptitrate this further at this time due to low resting heart rate. We will add losartan 25 mg daily and have the patient return in about 2 weeks for a basic metabolic panel.   Hypertension Blood pressure is normal today. We will add low-dose losartan, as above, for optimization of heart failure therapy.  Hyperlipidemia Patient's goal LDL is less than 70. She is tolerating atorvastatin well. We will plan to repeat lipid panel in about 6-8 weeks.  Follow-up: Return to clinic in 1 month.   Nelva Bush, MD 07/20/2016 1:06 PM

## 2016-07-19 NOTE — Patient Instructions (Signed)
Medication Instructions:  Your physician has recommended you make the following change in your medication:  1- START TAKING Losartan 25 mg (1 tablet) by mouth once a day.   Labwork: Your physician recommends that you return for lab work in: [place] ([facility]) AT [facility]. PLEASE FAX RESULTS TO [phone].   Testing/Procedures: none  Follow-Up: Your physician recommends that you schedule a follow-up appointment in: [place].  You have been referred to [place]. Please call [phone] to schedule an appointment if you don't hear back from them in a few days.     DASH Eating Plan DASH stands for "Dietary Approaches to Stop Hypertension." The DASH eating plan is a healthy eating plan that has been shown to reduce high blood pressure (hypertension). It may also reduce your risk for type 2 diabetes, heart disease, and stroke. The DASH eating plan may also help with weight loss. What are tips for following this plan? General guidelines   Avoid eating more than 2,300 mg (milligrams) of salt (sodium) a day. If you have hypertension, you may need to reduce your sodium intake to 1,500 mg a day.  Limit alcohol intake to no more than 1 drink a day for nonpregnant women and 2 drinks a day for men. One drink equals 12 oz of beer, 5 oz of wine, or 1 oz of hard liquor.  Work with your health care provider to maintain a healthy body weight or to lose weight. Ask what an ideal weight is for you.  Get at least 30 minutes of exercise that causes your heart to beat faster (aerobic exercise) most days of the week. Activities may include walking, swimming, or biking.  Work with your health care provider or diet and nutrition specialist (dietitian) to adjust your eating plan to your individual calorie needs. Reading food labels   Check food labels for the amount of sodium per serving. Choose foods with less than 5 percent of the Daily Value of sodium. Generally, foods with less than  300 mg of sodium per serving fit into this eating plan.  To find whole grains, look for the word "whole" as the first word in the ingredient list. Shopping   Buy products labeled as "low-sodium" or "no salt added."  Buy fresh foods. Avoid canned foods and premade or frozen meals. Cooking   Avoid adding salt when cooking. Use salt-free seasonings or herbs instead of table salt or sea salt. Check with your health care provider or pharmacist before using salt substitutes.  Do not fry foods. Cook foods using healthy methods such as baking, boiling, grilling, and broiling instead.  Cook with heart-healthy oils, such as olive, canola, soybean, or sunflower oil. Meal planning    Eat a balanced diet that includes:  5 or more servings of fruits and vegetables each day. At each meal, try to fill half of your plate with fruits and vegetables.  Up to 6-8 servings of whole grains each day.  Less than 6 oz of lean meat, poultry, or fish each day. A 3-oz serving of meat is about the same size as a deck of cards. One egg equals 1 oz.  2 servings of low-fat dairy each day.  A serving of nuts, seeds, or beans 5 times each week.  Heart-healthy fats. Healthy fats called Omega-3 fatty acids are found in foods such as flaxseeds and coldwater fish, like sardines, salmon, and mackerel.  Limit how much you eat of the following:  Canned or prepackaged  foods.  Food that is high in trans fat, such as fried foods.  Food that is high in saturated fat, such as fatty meat.  Sweets, desserts, sugary drinks, and other foods with added sugar.  Full-fat dairy products.  Do not salt foods before eating.  Try to eat at least 2 vegetarian meals each week.  Eat more home-cooked food and less restaurant, buffet, and fast food.  When eating at a restaurant, ask that your food be prepared with less salt or no salt, if possible. What foods are recommended? The items listed may not be a complete list. Talk  with your dietitian about what dietary choices are best for you. Grains  Whole-grain or whole-wheat bread. Whole-grain or whole-wheat pasta. Brown rice. [place]. Bulgur. Whole-grain and low-sodium cereals. Pita bread. Low-fat, low-sodium crackers. Whole-wheat flour tortillas. Vegetables  Fresh or frozen vegetables (raw, steamed, roasted, or grilled). Low-sodium or reduced-sodium tomato and vegetable juice. Low-sodium or reduced-sodium tomato sauce and tomato paste. Low-sodium or reduced-sodium canned vegetables. Fruits  All fresh, dried, or frozen fruit. Canned fruit in natural juice (without added sugar). Meat and other protein foods  Skinless chicken or [place]. Ground chicken or [place]. Pork with fat trimmed off. Fish and seafood. Egg whites. Dried beans, peas, or lentils. Unsalted nuts, nut butters, and seeds. Unsalted canned beans. Lean cuts of beef with fat trimmed off. Low-sodium, lean deli meat. Dairy  Low-fat (1%) or fat-free (skim) milk. Fat-free, low-fat, or reduced-fat cheeses. Nonfat, low-sodium ricotta or cottage cheese. Low-fat or nonfat yogurt. Low-fat, low-sodium cheese. Fats and oils  Soft margarine without trans fats. Vegetable oil. Low-fat, reduced-fat, or light mayonnaise and salad dressings (reduced-sodium). Canola, safflower, olive, soybean, and sunflower oils. Avocado. Seasoning and other foods  Herbs. Spices. Seasoning mixes without salt. Unsalted popcorn and pretzels. Fat-free sweets. What foods are not recommended? The items listed may not be a complete list. Talk with your dietitian about what dietary choices are best for you. Grains  Baked goods made with fat, such as croissants, muffins, or some breads. Dry pasta or rice meal packs. Vegetables  Creamed or fried vegetables. Vegetables in a cheese sauce. Regular canned vegetables (not low-sodium or reduced-sodium). Regular canned tomato sauce and paste (not low-sodium or reduced-sodium). Regular tomato and  vegetable juice (not low-sodium or reduced-sodium). Angie Fava. Olives. Fruits  Canned fruit in a light or heavy syrup. Fried fruit. Fruit in cream or butter sauce. Meat and other protein foods  Fatty cuts of meat. Ribs. Fried meat. Berniece Salines. Sausage. Bologna and other processed lunch meats. Salami. Fatback. Hotdogs. Bratwurst. Salted nuts and seeds. Canned beans with added salt. Canned or smoked fish. Whole eggs or egg yolks. Chicken or [place] with skin. Dairy  Whole or 2% milk, cream, and half-and-half. Whole or full-fat cream cheese. Whole-fat or sweetened yogurt. Full-fat cheese. Nondairy creamers. Whipped toppings. Processed cheese and cheese spreads. Fats and oils  Butter. Stick margarine. Lard. Shortening. Ghee. Bacon fat. Tropical oils, such as coconut, palm kernel, or palm oil. Seasoning and other foods  Salted popcorn and pretzels. Onion salt, garlic salt, seasoned salt, table salt, and sea salt. Worcestershire sauce. Tartar sauce. Barbecue sauce. Teriyaki sauce. Soy sauce, including reduced-sodium. Steak sauce. Canned and packaged gravies. Fish sauce. Oyster sauce. Cocktail sauce. Horseradish that you find on the shelf. Ketchup. Mustard. Meat flavorings and tenderizers. Bouillon cubes. Hot sauce and Tabasco sauce. Premade or packaged marinades. Premade or packaged taco seasonings. Relishes. Regular salad dressings. Where to find more information:  National Heart, Lung,  and Blood Institute: https://wilson-eaton.com/  American Heart Association: www.heart.org Summary  The DASH eating plan is a healthy eating plan that has been shown to reduce high blood pressure (hypertension). It may also reduce your risk for type 2 diabetes, heart disease, and stroke.  With the DASH eating plan, you should limit salt (sodium) intake to 2,300 mg a day. If you have hypertension, you may need to reduce your sodium intake to 1,500 mg a day.  When on the DASH eating plan, aim to eat more fresh fruits and vegetables,  whole grains, lean proteins, low-fat dairy, and heart-healthy fats.  Work with your health care provider or diet and nutrition specialist (dietitian) to adjust your eating plan to your individual calorie needs. This information is not intended to replace advice given to you by your health care provider. Make sure you discuss any questions you have with your health care provider. Document Released: 02/23/2011 Document Revised: 02/28/2016 Document Reviewed: 02/28/2016 Elsevier Interactive Patient Education  2017 [employer].  If you need a refill on your cardiac medications before your next appointment, please call your pharmacy.

## 2016-07-20 ENCOUNTER — Encounter: Payer: Self-pay | Admitting: Internal Medicine

## 2016-07-21 NOTE — Telephone Encounter (Signed)
Patient delivered signed roi and $25 check fee for [facility] this morning. Faxed scanned and mailed interoffice mail to [facility].   Patient needs these asap

## 2016-07-24 ENCOUNTER — Other Ambulatory Visit: Payer: Self-pay | Admitting: *Deleted

## 2016-07-28 ENCOUNTER — Telehealth: Payer: Self-pay | Admitting: Internal Medicine

## 2016-07-28 NOTE — Telephone Encounter (Signed)
Received paper work from [employer] handed to Copy for the day

## 2016-07-28 NOTE — Telephone Encounter (Addendum)
Forms received from [employer] [facility] in red folder. Placed in Dr. Darnelle Bos "Inbox" basket on his desk for his review.

## 2016-08-02 ENCOUNTER — Telehealth: Payer: Self-pay | Admitting: Internal Medicine

## 2016-08-02 NOTE — Telephone Encounter (Signed)
Sent completed forms to [facility]

## 2016-08-03 ENCOUNTER — Encounter: Payer: Self-pay | Admitting: *Deleted

## 2016-08-03 ENCOUNTER — Other Ambulatory Visit: Payer: Self-pay | Admitting: Internal Medicine

## 2016-08-03 ENCOUNTER — Encounter: Payer: Managed Care, Other (non HMO) | Attending: Internal Medicine | Admitting: *Deleted

## 2016-08-03 VITALS — Ht 70.5 in | Wt 240.6 lb

## 2016-08-03 DIAGNOSIS — F17210 Nicotine dependence, cigarettes, uncomplicated: Secondary | ICD-10-CM | POA: Diagnosis not present

## 2016-08-03 DIAGNOSIS — Z79899 Other long term (current) drug therapy: Secondary | ICD-10-CM | POA: Diagnosis not present

## 2016-08-03 DIAGNOSIS — I2510 Atherosclerotic heart disease of native coronary artery without angina pectoris: Secondary | ICD-10-CM | POA: Diagnosis not present

## 2016-08-03 DIAGNOSIS — E669 Obesity, unspecified: Secondary | ICD-10-CM | POA: Diagnosis not present

## 2016-08-03 DIAGNOSIS — E785 Hyperlipidemia, unspecified: Secondary | ICD-10-CM | POA: Insufficient documentation

## 2016-08-03 DIAGNOSIS — Z7902 Long term (current) use of antithrombotics/antiplatelets: Secondary | ICD-10-CM | POA: Insufficient documentation

## 2016-08-03 DIAGNOSIS — I10 Essential (primary) hypertension: Secondary | ICD-10-CM | POA: Insufficient documentation

## 2016-08-03 DIAGNOSIS — Z7982 Long term (current) use of aspirin: Secondary | ICD-10-CM | POA: Insufficient documentation

## 2016-08-03 DIAGNOSIS — I252 Old myocardial infarction: Secondary | ICD-10-CM | POA: Insufficient documentation

## 2016-08-03 DIAGNOSIS — I214 Non-ST elevation (NSTEMI) myocardial infarction: Secondary | ICD-10-CM

## 2016-08-03 NOTE — Patient Instructions (Signed)
Patient Instructions  Patient Details  Name: Deanna Moran MRN: [id number] Date of Birth: 05-28-1965 Referring Provider:  Nelva Bush, MD  Below are the personal goals you chose as well as exercise and nutrition goals. Our goal is to help you keep on track towards obtaining and maintaining your goals. We will be discussing your progress on these goals with you throughout the program.  Initial Exercise Prescription:     Initial Exercise Prescription - 08/03/16 1400      Date of Initial Exercise RX and Referring Provider   Date 08/03/16   Referring Provider End     Treadmill   MPH 2.3   Grade 1   Minutes 15   METs 3.08     Recumbant Bike   Level 2   RPM 60   Watts 40   Minutes 15   METs 3.14     NuStep   Level 3   SPM 80   Minutes 15   METs 3     REL-XR   Level 3   Speed 50   Minutes 15   METs 3     Prescription Details   Frequency (times per week) 3   Duration Progress to 30 minutes of continuous aerobic without signs/symptoms of physical distress     Intensity   THRR 40-80% of Max Heartrate 106-147   Ratings of Perceived Exertion 11-13     Resistance Training   Training Prescription Yes   Weight 3   Reps 10-15      Exercise Goals: Frequency: Be able to perform aerobic exercise three times per week working toward 3-5 days per week.  Intensity: Work with a perceived exertion of 11 (fairly light) - 15 (hard) as tolerated. Follow your new exercise prescription and watch for changes in prescription as you progress with the program. Changes will be reviewed with you when they are made.  Duration: You should be able to do 30 minutes of continuous aerobic exercise in addition to a 5 minute warm-up and a 5 minute cool-down routine.  Nutrition Goals: Your personal nutrition goals will be established when you do your nutrition analysis with the dietician.  The following are nutrition guidelines to follow: Cholesterol < 200mg /day Sodium <  1500mg /day Fiber: Women over 50 yrs - 21 grams per day  Personal Goals:     Personal Goals and Risk Factors at Admission - 08/03/16 1347      Core Components/Risk Factors/Patient Goals on Admission    Weight Management Yes;Obesity;Weight Loss   Intervention Weight Management: Develop a combined nutrition and exercise program designed to reach desired caloric intake, while maintaining appropriate intake of nutrient and fiber, sodium and fats, and appropriate energy expenditure required for the weight goal.;Weight Management: Provide education and appropriate resources to help participant work on and attain dietary goals.;Weight Management/Obesity: Establish reasonable short term and long term weight goals.;Obesity: Provide education and appropriate resources to help participant work on and attain dietary goals.   Admit Weight 240 lb 9.6 oz (109.1 kg)   Goal Weight: Short Term 135 lb (61.2 kg)   Goal Weight: Long Term 170 lb (77.1 kg)   Expected Outcomes Short Term: Continue to assess and modify interventions until short term weight is achieved;Long Term: Adherence to nutrition and physical activity/exercise program aimed toward attainment of established weight goal;Weight Maintenance: Understanding of the daily nutrition guidelines, which includes 25-35% calories from fat, 7% or less cal from saturated fats, less than 200mg  cholesterol, less than 1.5gm of sodium, &  5 or more servings of fruits and vegetables daily;Weight Loss: Understanding of general recommendations for a balanced deficit meal plan, which promotes 1-2 lb weight loss per week and includes a negative energy balance of [phone] kcal/d;Understanding recommendations for meals to include 15-35% energy as protein, 25-35% energy from fat, 35-60% energy from carbohydrates, less than 200mg  of dietary cholesterol, 20-35 gm of total fiber daily;Understanding of distribution of calorie intake throughout the day with the consumption of 4-5  meals/snacks   Tobacco Cessation Yes   Number of packs per day 0.25-0.5 per day. She is smoking less than before her NSTEMI (1-2 packs per day). Son is limiting her cigarettes and only giving her 5-10 a day depending on how stressed she is. She would like to quit but has tried multiple methods and has either bad interactions with patch and the pills. She says she may not ever fully be able to quit but she is happy with where she is now.   Intervention Assist the participant in steps to quit. Provide individualized education and counseling about committing to Tobacco Cessation, relapse prevention, and pharmacological support that can be provided by physician.;Advice worker, assist with locating and accessing local/national Quit Smoking programs, and support quit date choice.   Expected Outcomes Short Term: Will demonstrate readiness to quit, by selecting a quit date.;Long Term: Complete abstinence from all tobacco products for at least 12 months from quit date.;Short Term: Will quit all tobacco product use, adhering to prevention of relapse plan.   Improve shortness of breath with ADL's Yes  States she gets winded walking to and from her car at work.   Intervention Provide education, individualized exercise plan and daily activity instruction to help decrease symptoms of SOB with activities of daily living.   Expected Outcomes Short Term: Achieves a reduction of symptoms when performing activities of daily living.   Hypertension Yes   Intervention Provide education on lifestyle modifcations including regular physical activity/exercise, weight management, moderate sodium restriction and increased consumption of fresh fruit, vegetables, and low fat dairy, alcohol moderation, and smoking cessation.;Monitor prescription use compliance.   Expected Outcomes Short Term: Continued assessment and intervention until BP is < 140/74mm HG in hypertensive participants. < 130/40mm HG in hypertensive  participants with diabetes, heart failure or chronic kidney disease.;Long Term: Maintenance of blood pressure at goal levels.   Lipids Yes   Intervention Provide education and support for participant on nutrition & aerobic/resistive exercise along with prescribed medications to achieve LDL 70mg , HDL >40mg .   Expected Outcomes Short Term: Participant states understanding of desired cholesterol values and is compliant with medications prescribed. Participant is following exercise prescription and nutrition guidelines.;Long Term: Cholesterol controlled with medications as prescribed, with individualized exercise RX and with personalized nutrition plan. Value goals: LDL < 70mg , HDL > 40 mg.   Stress Yes   Intervention Offer individual and/or small group education and counseling on adjustment to heart disease, stress management and health-related lifestyle change. Teach and support self-help strategies.;Refer participants experiencing significant psychosocial distress to appropriate mental health specialists for further evaluation and treatment. When possible, include family members and significant others in education/counseling sessions.  She does have days where she is feeling sorry for herself and emotional but her 52 year old son helps her cope and feel better.    Expected Outcomes Short Term: Participant demonstrates changes in health-related behavior, relaxation and other stress management skills, ability to obtain effective social support, and compliance with psychotropic medications if prescribed.;Long Term: Emotional wellbeing is  indicated by absence of clinically significant psychosocial distress or social isolation.      Tobacco Use Initial Evaluation: History  Smoking Status  . Current Every Day Smoker  . Packs/day: 0.50  . Years: 38.00  . Types: Cigarettes  Smokeless Tobacco  . Never Used    Comment: states she is smoking less than before her NSTEMI. Son is limiting her cigarettes and  only giving her 5-10 a day depending on how stressed she is. She would like to quit but has tried multiple methods and has either bad interactions or unsuccessful.    Copy of goals given to participant.

## 2016-08-03 NOTE — Telephone Encounter (Signed)
Forms signed by Dr End and returned to front office staff for processing yesterday, 08/03/16.

## 2016-08-03 NOTE — Progress Notes (Signed)
Cardiac Individual Treatment Plan  Patient Details  Name: Deanna Moran MRN: [id number] Date of Birth: 08-05-1965 Referring Provider:     Cardiac Rehab from 08/03/2016 in [facility] Cardiac and Pulmonary Rehab  Referring Provider  End      Initial Encounter Date:    Cardiac Rehab from 08/03/2016 in [facility] Cardiac and Pulmonary Rehab  Date  08/03/16  Referring Provider  End      Visit Diagnosis: NSTEMI (non-ST elevated myocardial infarction) [facility])  Patient's Home Medications on Admission:  Current Outpatient Prescriptions:  .  aspirin EC 81 MG EC tablet, Take 1 tablet (81 mg total) by mouth daily., Disp: 30 tablet, Rfl: 3 .  atorvastatin (LIPITOR) 80 MG tablet, Take 1 tablet (80 mg total) by mouth daily at 6 PM., Disp: 30 tablet, Rfl: 3 .  carvedilol (COREG) 3.125 MG tablet, Take 1 tablet (3.125 mg total) by mouth 2 (two) times daily with a meal., Disp: 60 tablet, Rfl: 2 .  clopidogrel (PLAVIX) 75 MG tablet, Take 1 tablet (75 mg total) by mouth daily with breakfast., Disp: 30 tablet, Rfl: 2 .  losartan (COZAAR) 25 MG tablet, Take 1 tablet (25 mg total) by mouth daily., Disp: 90 tablet, Rfl: 3 .  naproxen sodium (ANAPROX) 220 MG tablet, Take 220 mg by mouth as needed., Disp: , Rfl:  .  nitroGLYCERIN (NITROSTAT) 0.4 MG SL tablet, Place 1 tablet (0.4 mg total) under the tongue every 5 (five) minutes x 3 doses as needed for chest pain., Disp: 30 tablet, Rfl: 2 .  prednisoLONE acetate (PRED FORTE) 1 % ophthalmic suspension, Place 2 drops into both eyes 4 (four) times daily., Disp: , Rfl:   Past Medical History: Past Medical History:  Diagnosis Date  . Chronic back pain   . Coronary artery disease   . HLD (hyperlipidemia)   . HTN (hypertension)   . NSTEMI (non-ST elevated myocardial infarction) ([place]) 06/2016  . Obesity     Tobacco Use: History  Smoking Status  . Current Every Day Smoker  . Packs/day: 0.50  . Years: 38.00  . Types: Cigarettes  Smokeless Tobacco  . Never Used     Comment: states she is smoking less than before her NSTEMI. Son is limiting her cigarettes and only giving her 5-10 a day depending on how stressed she is. She would like to quit but has tried multiple methods and has either bad interactions or unsuccessful.    Labs: Recent Review Flowsheet Data    Labs for ITP Cardiac and Pulmonary Rehab Latest Ref Rng & Units 07/10/2016 07/11/2016   Cholestrol 0 - 200 mg/dL - 144   LDLCALC 0 - 99 mg/dL - 103(H)   HDL >40 mg/dL - 31(L)   Trlycerides <150 mg/dL - 52   Hemoglobin A1c 4.8 - 5.6 % 5.7(H) -       Exercise Target Goals: Date: 08/03/16  Exercise Program Goal: Individual exercise prescription set with THRR, safety & activity barriers. Participant demonstrates ability to understand and report RPE using BORG scale, to self-measure pulse accurately, and to acknowledge the importance of the exercise prescription.  Exercise Prescription Goal: Starting with aerobic activity 30 plus minutes a day, 3 days per week for initial exercise prescription. Provide home exercise prescription and guidelines that participant acknowledges understanding prior to discharge.  Activity Barriers & Risk Stratification:     Activity Barriers & Cardiac Risk Stratification - 08/03/16 1355      Activity Barriers & Cardiac Risk Stratification   Activity Barriers Joint Problems;Back  Problems  She has scoliosis in her back causing 24/7 pain in her back that she has learned to deal with. She also has pain in her right hip but is not a surgical candidate at this time.    Cardiac Risk Stratification High      6 Minute Walk:     6 Minute Walk    Row Name 08/03/16 1441         6 Minute Walk   Distance 1186 feet     Walk Time 6 minutes     # of Rest Breaks 0     MPH 2.24     METS 3.66     RPE 13     VO2 Peak 12.8     Symptoms Yes (comment)     Comments back pain 5/10     Resting HR 65 bpm     Resting BP 148/78     Max Ex. HR 108 bpm     Max Ex. BP 164/80      2 Minute Post BP 128/68        Oxygen Initial Assessment:   Oxygen Re-Evaluation:   Oxygen Discharge (Final Oxygen Re-Evaluation):   Initial Exercise Prescription:     Initial Exercise Prescription - 08/03/16 1400      Date of Initial Exercise RX and Referring Provider   Date 08/03/16   Referring Provider End     Treadmill   MPH 2.3   Grade 1   Minutes 15   METs 3.08     Recumbant Bike   Level 2   RPM 60   Watts 40   Minutes 15   METs 3.14     NuStep   Level 3   SPM 80   Minutes 15   METs 3     REL-XR   Level 3   Speed 50   Minutes 15   METs 3     Prescription Details   Frequency (times per week) 3   Duration Progress to 30 minutes of continuous aerobic without signs/symptoms of physical distress     Intensity   THRR 40-80% of Max Heartrate 106-147   Ratings of Perceived Exertion 11-13     Resistance Training   Training Prescription Yes   Weight 3   Reps 10-15      Perform Capillary Blood Glucose checks as needed.  Exercise Prescription Changes:     Exercise Prescription Changes    Row Name 08/03/16 1300             Response to Exercise   Blood Pressure (Admit) 148/78       Blood Pressure (Exercise) 164/80       Blood Pressure (Exit) 128/68       Heart Rate (Admit) 76 bpm       Heart Rate (Exercise) 108 bpm       Heart Rate (Exit) 68 bpm       Rating of Perceived Exertion (Exercise) 13          Exercise Comments:   Exercise Goals and Review:     Exercise Goals    Row Name 08/03/16 1441             Exercise Goals   Increase Physical Activity Yes       Intervention Provide advice, education, support and counseling about physical activity/exercise needs.;Develop an individualized exercise prescription for aerobic and resistive training based on initial evaluation findings, risk stratification, comorbidities and participant's personal goals.  Expected Outcomes Achievement of increased cardiorespiratory fitness  and enhanced flexibility, muscular endurance and strength shown through measurements of functional capacity and personal statement of participant.       Increase Strength and Stamina Yes       Intervention Provide advice, education, support and counseling about physical activity/exercise needs.;Develop an individualized exercise prescription for aerobic and resistive training based on initial evaluation findings, risk stratification, comorbidities and participant's personal goals.       Expected Outcomes Achievement of increased cardiorespiratory fitness and enhanced flexibility, muscular endurance and strength shown through measurements of functional capacity and personal statement of participant.          Exercise Goals Re-Evaluation :   Discharge Exercise Prescription (Final Exercise Prescription Changes):     Exercise Prescription Changes - 08/03/16 1300      Response to Exercise   Blood Pressure (Admit) 148/78   Blood Pressure (Exercise) 164/80   Blood Pressure (Exit) 128/68   Heart Rate (Admit) 76 bpm   Heart Rate (Exercise) 108 bpm   Heart Rate (Exit) 68 bpm   Rating of Perceived Exertion (Exercise) 13      Nutrition:  Target Goals: Understanding of nutrition guidelines, daily intake of sodium 1500mg , cholesterol 200mg , calories 30% from fat and 7% or less from saturated fats, daily to have 5 or more servings of fruits and vegetables.  Biometrics:     Pre Biometrics - 08/03/16 1441      Pre Biometrics   Height 5' 10.5" (1.791 m)   Weight 240 lb 9.6 oz (109.1 kg)   Waist Circumference 44 inches   Hip Circumference 47.5 inches   Waist to Hip Ratio 0.93 %   BMI (Calculated) 34.1   Single Leg Stand 26.57 seconds       Nutrition Therapy Plan and Nutrition Goals:     Nutrition Therapy & Goals - 08/03/16 1359      Intervention Plan   Intervention Prescribe, educate and counsel regarding individualized specific dietary modifications aiming towards targeted core  components such as weight, hypertension, lipid management, diabetes, heart failure and other comorbidities.;Nutrition handout(s) given to patient.   Expected Outcomes Short Term Goal: Understand basic principles of dietary content, such as calories, fat, sodium, cholesterol and nutrients.;Short Term Goal: A plan has been developed with personal nutrition goals set during dietitian appointment.;Long Term Goal: Adherence to prescribed nutrition plan.      Nutrition Discharge: Rate Your Plate Scores:     Nutrition Assessments - 08/03/16 1254      MEDFICTS Scores   Pre Score 29      Nutrition Goals Re-Evaluation:   Nutrition Goals Discharge (Final Nutrition Goals Re-Evaluation):   Psychosocial: Target Goals: Acknowledge presence or absence of significant depression and/or stress, maximize coping skills, provide positive support system. Participant is able to verbalize types and ability to use techniques and skills needed for reducing stress and depression.   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 She gets very short of breath with activity and would like to be able to do more things around the house without taking breaks. She also states she has some mild anxiety  and times of feeling sorry for herself and getting emotional but her son helps her get through them fairly quickly.      Family Dynamics   Good Support System? Yes   Comments Her 44 year old son is her main support as well as some co-workers. Her older son is overseas in [place] where she will be visiting this summer. Her ex-husband is her emergency contact.      Barriers    Psychosocial barriers to participate in program The patient should benefit from training in stress management and relaxation.     Screening Interventions   Interventions Program counselor consult;Encouraged to exercise;To provide support and resources with identified psychosocial needs;Provide feedback about the scores to participant      Quality of Life Scores:      Quality of Life - 08/03/16 1252      Quality of Life Scores   Health/Function Pre 21.14 %   Socioeconomic Pre 20.67 %   Psych/Spiritual Pre 22.67 %   Family Pre 28.5 %   GLOBAL Pre 22.33 %      PHQ-9: Recent Review Flowsheet Data    Depression screen [facility] 2/9 08/03/2016   Decreased Interest 0   Down, Depressed, Hopeless 1   PHQ - 2 Score 1   Altered sleeping 0   Tired, decreased energy 1   Change in appetite 0   Feeling bad or failure about yourself  1   Trouble concentrating 0   Moving slowly or fidgety/restless 0   Suicidal thoughts 0   PHQ-9 Score 3   Difficult doing work/chores Somewhat difficult     Interpretation of Total Score  Total Score Depression Severity:  1-4 = Minimal depression, 5-9 = Mild depression, 10-14 = Moderate depression, 15-19 = Moderately severe depression, 20-27 = Severe depression   Psychosocial Evaluation and Intervention:   Psychosocial Re-Evaluation:   Psychosocial Discharge (Final Psychosocial Re-Evaluation):   Vocational Rehabilitation: Provide vocational rehab assistance to qualifying candidates.   Vocational Rehab Evaluation & Intervention:     Vocational Rehab - 08/03/16 1405      Initial Vocational Rehab Evaluation & Intervention   Assessment shows need for Vocational Rehabilitation No      Education: Education Goals: Education classes will be provided on a weekly basis, covering required topics. Participant will state understanding/return demonstration of topics presented.  Learning Barriers/Preferences:     Learning Barriers/Preferences - 08/03/16  1405      Learning Barriers/Preferences   Learning Barriers None   Learning Preferences Individual Instruction      Education Topics: General Nutrition Guidelines/Fats and Fiber: -Group instruction provided by verbal, written material, models and posters to present the general guidelines for heart healthy nutrition. Gives an explanation and review of dietary fats and fiber.   Controlling Sodium/Reading Food Labels: -Group verbal and written material supporting the discussion of sodium use in heart healthy nutrition. Review and explanation with models, verbal and written materials for utilization of the food label.   Exercise Physiology & Risk Factors: - Group verbal and written instruction with models to review the exercise physiology of the cardiovascular system and associated critical values. Details cardiovascular disease risk factors and the goals associated with each risk factor.   Aerobic Exercise & Resistance Training: - Gives group verbal and written discussion on the health impact of inactivity. On the components of aerobic and resistive training programs and the benefits of this training and how to safely progress through these programs.   Flexibility, Balance, General Exercise  Guidelines: - Provides group verbal and written instruction on the benefits of flexibility and balance training programs. Provides general exercise guidelines with specific guidelines to those with heart or lung disease. Demonstration and skill practice provided.   Stress Management: - Provides group verbal and written instruction about the health risks of elevated stress, cause of high stress, and healthy ways to reduce stress.   Depression: - Provides group verbal and written instruction on the correlation between heart/lung disease and depressed mood, treatment options, and the stigmas associated with seeking treatment.   Anatomy & Physiology of the Heart: - Group verbal and written  instruction and models provide basic cardiac anatomy and physiology, with the coronary electrical and arterial systems. Review of: AMI, Angina, Valve disease, Heart Failure, Cardiac Arrhythmia, Pacemakers, and the ICD.   Cardiac Procedures: - Group verbal and written instruction and models to describe the testing methods done to diagnose heart disease. Reviews the outcomes of the test results. Describes the treatment choices: Medical Management, Angioplasty, or Coronary Bypass Surgery.   Cardiac Medications: - Group verbal and written instruction to review commonly prescribed medications for heart disease. Reviews the medication, class of the drug, and side effects. Includes the steps to properly store meds and maintain the prescription regimen.   Go Sex-Intimacy & Heart Disease, Get SMART - Goal Setting: - Group verbal and written instruction through game format to discuss heart disease and the return to sexual intimacy. Provides group verbal and written material to discuss and apply goal setting through the application of the S.M.A.R.T. Method.   Other Matters of the Heart: - Provides group verbal, written materials and models to describe Heart Failure, Angina, Valve Disease, and Diabetes in the realm of heart disease. Includes description of the disease process and treatment options available to the cardiac patient.   Exercise & Equipment Safety: - Individual verbal instruction and demonstration of equipment use and safety with use of the equipment.   Cardiac Rehab from 08/03/2016 in [facility] Cardiac and Pulmonary Rehab  Date  08/03/16  Educator  KS  Instruction Review Code  2- meets goals/outcomes      Infection Prevention: - Provides verbal and written material to individual with discussion of infection control including proper hand washing and proper equipment cleaning during exercise session.   Cardiac Rehab from 08/03/2016 in [facility] Cardiac and Pulmonary Rehab  Date  08/03/16   Educator  KS  Instruction Review Code  2- meets goals/outcomes      Falls Prevention: - Provides verbal and written material to individual with discussion of falls prevention and safety.   Cardiac Rehab from 08/03/2016 in [facility] Cardiac and Pulmonary Rehab  Date  08/03/16  Educator  KS  Instruction Review Code  2- meets goals/outcomes      Diabetes: - Individual verbal and written instruction to review signs/symptoms of diabetes, desired ranges of glucose level fasting, after meals and with exercise. Advice that pre and post exercise glucose checks will be done for 3 sessions at entry of program.    Knowledge Questionnaire Score:     Knowledge Questionnaire Score - 08/03/16 1254      Knowledge Questionnaire Score   Pre Score 23/28  Correct responses reviewed with patient with verbal feedback.       Core Components/Risk Factors/Patient Goals at Admission:     Personal Goals and Risk Factors at Admission - 08/03/16 1347      Core Components/Risk Factors/Patient Goals on Admission    Weight Management Yes;Obesity;Weight Loss  Intervention Weight Management: Develop a combined nutrition and exercise program designed to reach desired caloric intake, while maintaining appropriate intake of nutrient and fiber, sodium and fats, and appropriate energy expenditure required for the weight goal.;Weight Management: Provide education and appropriate resources to help participant work on and attain dietary goals.;Weight Management/Obesity: Establish reasonable short term and long term weight goals.;Obesity: Provide education and appropriate resources to help participant work on and attain dietary goals.   Admit Weight 240 lb 9.6 oz (109.1 kg)   Goal Weight: Short Term 135 lb (61.2 kg)   Goal Weight: Long Term 170 lb (77.1 kg)   Expected Outcomes Short Term: Continue to assess and modify interventions until short term weight is achieved;Long Term: Adherence to nutrition and physical  activity/exercise program aimed toward attainment of established weight goal;Weight Maintenance: Understanding of the daily nutrition guidelines, which includes 25-35% calories from fat, 7% or less cal from saturated fats, less than 200mg  cholesterol, less than 1.5gm of sodium, & 5 or more servings of fruits and vegetables daily;Weight Loss: Understanding of general recommendations for a balanced deficit meal plan, which promotes 1-2 lb weight loss per week and includes a negative energy balance of [phone] kcal/d;Understanding recommendations for meals to include 15-35% energy as protein, 25-35% energy from fat, 35-60% energy from carbohydrates, less than 200mg  of dietary cholesterol, 20-35 gm of total fiber daily;Understanding of distribution of calorie intake throughout the day with the consumption of 4-5 meals/snacks   Tobacco Cessation Yes   Number of packs per day 0.25-0.5 per day. She is smoking less than before her NSTEMI (1-2 packs per day). Son is limiting her cigarettes and only giving her 5-10 a day depending on how stressed she is. She would like to quit but has tried multiple methods and has either bad interactions with patch and the pills. She says she may not ever fully be able to quit but she is happy with where she is now.   Intervention Assist the participant in steps to quit. Provide individualized education and counseling about committing to Tobacco Cessation, relapse prevention, and pharmacological support that can be provided by physician.;Advice worker, assist with locating and accessing local/national Quit Smoking programs, and support quit date choice.   Expected Outcomes Short Term: Will demonstrate readiness to quit, by selecting a quit date.;Long Term: Complete abstinence from all tobacco products for at least 12 months from quit date.;Short Term: Will quit all tobacco product use, adhering to prevention of relapse plan.   Improve shortness of breath with ADL's Yes   States she gets winded walking to and from her car at work.   Intervention Provide education, individualized exercise plan and daily activity instruction to help decrease symptoms of SOB with activities of daily living.   Expected Outcomes Short Term: Achieves a reduction of symptoms when performing activities of daily living.   Hypertension Yes   Intervention Provide education on lifestyle modifcations including regular physical activity/exercise, weight management, moderate sodium restriction and increased consumption of fresh fruit, vegetables, and low fat dairy, alcohol moderation, and smoking cessation.;Monitor prescription use compliance.   Expected Outcomes Short Term: Continued assessment and intervention until BP is < 140/33mm HG in hypertensive participants. < 130/17mm HG in hypertensive participants with diabetes, heart failure or chronic kidney disease.;Long Term: Maintenance of blood pressure at goal levels.   Lipids Yes   Intervention Provide education and support for participant on nutrition & aerobic/resistive exercise along with prescribed medications to achieve LDL 70mg , HDL >40mg .   Expected  Outcomes Short Term: Participant states understanding of desired cholesterol values and is compliant with medications prescribed. Participant is following exercise prescription and nutrition guidelines.;Long Term: Cholesterol controlled with medications as prescribed, with individualized exercise RX and with personalized nutrition plan. Value goals: LDL < 70mg , HDL > 40 mg.   Stress Yes   Intervention Offer individual and/or small group education and counseling on adjustment to heart disease, stress management and health-related lifestyle change. Teach and support self-help strategies.;Refer participants experiencing significant psychosocial distress to appropriate mental health specialists for further evaluation and treatment. When possible, include family members and significant others in  education/counseling sessions.  She does have days where she is feeling sorry for herself and emotional but her 40 year old son helps her cope and feel better.    Expected Outcomes Short Term: Participant demonstrates changes in health-related behavior, relaxation and other stress management skills, ability to obtain effective social support, and compliance with psychotropic medications if prescribed.;Long Term: Emotional wellbeing is indicated by absence of clinically significant psychosocial distress or social isolation.      Core Components/Risk Factors/Patient Goals Review:    Core Components/Risk Factors/Patient Goals at Discharge (Final Review):    ITP Comments:   Comments: Medical Review Completed; initial ITP created. Diagnosis Documentation can be found in [facility] encounter dated 07/19/2016.

## 2016-08-03 NOTE — Progress Notes (Signed)
Daily Session Note  Patient Details  Name: Deanna Moran MRN: [id number] Date of Birth: February 14, 1966 Referring Provider:    Encounter Date: 08/03/2016  Check In:     Session Check In - 08/03/16 1344      Check-In   Location [facility]-Cardiac & Pulmonary Rehab   Staff Present Darel Hong, RN Vickki Hearing, BA, ACSM CEP, Exercise Physiologist;Meredith Sherryll Burger, RN BSN   Supervising physician immediately available to respond to emergencies See telemetry face sheet for immediately available ER MD   Medication changes reported     No   Fall or balance concerns reported    No   Tobacco Cessation Use Decreased  states she is smoking less than before her NSTEMI. Son is limiting her cigarettes and only giving her 5-10 a day depending on how stressed she is. She would like to quit but has tried multiple methods and has either bad interactions or unsuccessful.    Warm-up and Cool-down Performed as group-led Location manager Performed Yes   VAD Patient? No     Pain Assessment   Currently in Pain? No/denies   Multiple Pain Sites No           Exercise Prescription Changes - 08/03/16 1300      Response to Exercise   Blood Pressure (Admit) 148/78   Blood Pressure (Exercise) 164/80   Blood Pressure (Exit) 128/68   Heart Rate (Admit) 76 bpm   Heart Rate (Exercise) 108 bpm   Heart Rate (Exit) 68 bpm   Rating of Perceived Exertion (Exercise) 13      History  Smoking Status  . Current Every Day Smoker  . Packs/day: 0.50  . Years: 38.00  . Types: Cigarettes  Smokeless Tobacco  . Never Used    Comment: states she is smoking less than before her NSTEMI. Son is limiting her cigarettes and only giving her 5-10 a day depending on how stressed she is. She would like to quit but has tried multiple methods and has either bad interactions or unsuccessful.    Goals Met:  Personal goals reviewed No report of cardiac concerns or symptoms Strength training completed  today  Goals Unmet:  Not Applicable  Comments: Medical Review completed.     Dr. Emily Filbert is Medical Director for [facility] and [facility].

## 2016-08-04 LAB — BASIC METABOLIC PANEL
BUN / CREAT RATIO: 11 (ref 9–23)
BUN: 10 mg/dL (ref 6–24)
CHLORIDE: 101 mmol/L (ref 96–106)
CO2: 24 mmol/L (ref 18–29)
CREATININE: 0.87 mg/dL (ref 0.57–1.00)
Calcium: 8.9 mg/dL (ref 8.7–10.2)
GFR calc Af Amer: 89 mL/min/{1.73_m2} (ref >59)
GFR, EST NON AFRICAN AMERICAN: 77 mL/min/{1.73_m2} (ref >59)
Glucose: 96 mg/dL (ref 65–99)
Potassium: 4.4 mmol/L (ref 3.5–5.2)
Sodium: 142 mmol/L (ref 134–144)

## 2016-08-07 ENCOUNTER — Telehealth: Payer: Self-pay | Admitting: *Deleted

## 2016-08-07 NOTE — Telephone Encounter (Signed)
-----   Message from Nelva Bush, MD sent at 08/06/2016 12:20 PM EDT ----- Please let Ms. Alcaide know that her kidney function and electrolytes are normal. She should continue with her current medications. Thanks.

## 2016-08-07 NOTE — Telephone Encounter (Signed)
Notes recorded by Vanessa Ralphs, RN on 08/07/2016 at 9:10 AM EDT Results called to pt. Pt verbalized understanding. Patient asked if her FMLA paperwork had been filled out yet. Advised it was sent back to [employer] on 08/02/16 and is processing. Patient upset that it had not been completed and sent to employer by this time as she is concerned about getting paid. Number to [employer] given to patient [phone]. Expressed by apologies and she was appreciative but frustrated as she is a single parent and needs the reimbursement from [facility]. She will call [employer] and let us know if any new concerns or questions.

## 2016-08-09 ENCOUNTER — Encounter: Payer: Self-pay | Admitting: *Deleted

## 2016-08-09 DIAGNOSIS — I214 Non-ST elevation (NSTEMI) myocardial infarction: Secondary | ICD-10-CM

## 2016-08-09 NOTE — Progress Notes (Signed)
Cardiac Individual Treatment Plan  Patient Details  Name: Deanna Moran MRN: [id number] Date of Birth: 1965-10-14 Referring Provider:     Cardiac Rehab from 08/03/2016 in [facility] Re[facility]  Referring Provider  End      Initial Encounter Date:    Cardiac Rehab from 08/03/2016 in [facility] Cardiac and Pulmonary Rehab  Date  08/03/16  Referring Provider  End      Visit Diagnosis: NSTEMI (non-ST elevated myocardial infarction) [facility])  Patient's Home Medications on Admission:  Current Outpatient Prescriptions:  .  aspirin EC 81 MG EC tablet, Take 1 tablet (81 mg total) by mouth daily., Disp: 30 tablet, Rfl: 3 .  atorvastatin (LIPITOR) 80 MG tablet, Take 1 tablet (80 mg total) by mouth daily at 6 PM., Disp: 30 tablet, Rfl: 3 .  carvedilol (COREG) 3.125 MG tablet, Take 1 tablet (3.125 mg total) by mouth 2 (two) times daily with a meal., Disp: 60 tablet, Rfl: 2 .  clopidogrel (PLAVIX) 75 MG tablet, Take 1 tablet (75 mg total) by mouth daily with breakfast., Disp: 30 tablet, Rfl: 2 .  losartan (COZAAR) 25 MG tablet, Take 1 tablet (25 mg total) by mouth daily., Disp: 90 tablet, Rfl: 3 .  naproxen sodium (ANAPROX) 220 MG tablet, Take 220 mg by mouth as needed., Disp: , Rfl:  .  nitroGLYCERIN (NITROSTAT) 0.4 MG SL tablet, Place 1 tablet (0.4 mg total) under the tongue every 5 (five) minutes x 3 doses as needed for chest pain., Disp: 30 tablet, Rfl: 2 .  prednisoLONE acetate (PRED FORTE) 1 % ophthalmic suspension, Place 2 drops into both eyes 4 (four) times daily., Disp: , Rfl:   Past Medical History: Past Medical History:  Diagnosis Date  . Chronic back pain   . Coronary artery disease   . HLD (hyperlipidemia)   . HTN (hypertension)   . NSTEMI (non-ST elevated myocardial infarction) ([place]) 06/2016  . Obesity     Tobacco Use: History  Smoking Status  . Current Every Day Smoker  . Packs/day: 0.50  . Years: 38.00  . Types: Cigarettes  Smokeless Tobacco  . Never Used   Comment: states she is smoking less than before her NSTEMI. Son is limiting her cigarettes and only giving her 5-10 a day depending on how stressed she is. She would like to quit but has tried multiple methods and has either bad interactions or unsuccessful.    Labs: Recent Review Flowsheet Data    Labs for ITP Cardiac and Pulmonary Rehab Latest Ref Rng & Units 07/10/2016 07/11/2016   Cholestrol 0 - 200 mg/dL - 144   LDLCALC 0 - 99 mg/dL - 103(H)   HDL >40 mg/dL - 31(L)   Trlycerides <150 mg/dL - 52   Hemoglobin A1c 4.8 - 5.6 % 5.7(H) -       Exercise Target Goals:    Exercise Program Goal: Individual exercise prescription set with THRR, safety & activity barriers. Participant demonstrates ability to understand and report RPE using BORG scale, to self-measure pulse accurately, and to acknowledge the importance of the exercise prescription.  Exercise Prescription Goal: Starting with aerobic activity 30 plus minutes a day, 3 days per week for initial exercise prescription. Provide home exercise prescription and guidelines that participant acknowledges understanding prior to discharge.  Activity Barriers & Risk Stratification:     Activity Barriers & Cardiac Risk Stratification - 08/03/16 1355      Activity Barriers & Cardiac Risk Stratification   Activity Barriers Joint Problems;Back Problems  She has scoliosis in her back causing 24/7 pain in her back that she has learned to deal with. She also has pain in her right hip but is not a surgical candidate at this time.    Cardiac Risk Stratification High      6 Minute Walk:     6 Minute Walk    Row Name 08/03/16 1441         6 Minute Walk   Distance 1186 feet     Walk Time 6 minutes     # of Rest Breaks 0     MPH 2.24     METS 3.66     RPE 13     VO2 Peak 12.8     Symptoms Yes (comment)     Comments back pain 5/10     Resting HR 65 bpm     Resting BP 148/78     Max Ex. HR 108 bpm     Max Ex. BP 164/80     2 Minute  Post BP 128/68        Oxygen Initial Assessment:   Oxygen Re-Evaluation:   Oxygen Discharge (Final Oxygen Re-Evaluation):   Initial Exercise Prescription:     Initial Exercise Prescription - 08/03/16 1400      Date of Initial Exercise RX and Referring Provider   Date 08/03/16   Referring Provider End     Treadmill   MPH 2.3   Grade 1   Minutes 15   METs 3.08     Recumbant Bike   Level 2   RPM 60   Watts 40   Minutes 15   METs 3.14     NuStep   Level 3   SPM 80   Minutes 15   METs 3     REL-XR   Level 3   Speed 50   Minutes 15   METs 3     Prescription Details   Frequency (times per week) 3   Duration Progress to 30 minutes of continuous aerobic without signs/symptoms of physical distress     Intensity   THRR 40-80% of Max Heartrate 106-147   Ratings of Perceived Exertion 11-13     Resistance Training   Training Prescription Yes   Weight 3   Reps 10-15      Perform Capillary Blood Glucose checks as needed.  Exercise Prescription Changes:     Exercise Prescription Changes    Row Name 08/03/16 1300             Response to Exercise   Blood Pressure (Admit) 148/78       Blood Pressure (Exercise) 164/80       Blood Pressure (Exit) 128/68       Heart Rate (Admit) 76 bpm       Heart Rate (Exercise) 108 bpm       Heart Rate (Exit) 68 bpm       Rating of Perceived Exertion (Exercise) 13          Exercise Comments:   Exercise Goals and Review:     Exercise Goals    Row Name 08/03/16 1441             Exercise Goals   Increase Physical Activity Yes       Intervention Provide advice, education, support and counseling about physical activity/exercise needs.;Develop an individualized exercise prescription for aerobic and resistive training based on initial evaluation findings, risk stratification, comorbidities and participant's personal goals.  Expected Outcomes Achievement of increased cardiorespiratory fitness and enhanced  flexibility, muscular endurance and strength shown through measurements of functional capacity and personal statement of participant.       Increase Strength and Stamina Yes       Intervention Provide advice, education, support and counseling about physical activity/exercise needs.;Develop an individualized exercise prescription for aerobic and resistive training based on initial evaluation findings, risk stratification, comorbidities and participant's personal goals.       Expected Outcomes Achievement of increased cardiorespiratory fitness and enhanced flexibility, muscular endurance and strength shown through measurements of functional capacity and personal statement of participant.          Exercise Goals Re-Evaluation :   Discharge Exercise Prescription (Final Exercise Prescription Changes):     Exercise Prescription Changes - 08/03/16 1300      Response to Exercise   Blood Pressure (Admit) 148/78   Blood Pressure (Exercise) 164/80   Blood Pressure (Exit) 128/68   Heart Rate (Admit) 76 bpm   Heart Rate (Exercise) 108 bpm   Heart Rate (Exit) 68 bpm   Rating of Perceived Exertion (Exercise) 13      Nutrition:  Target Goals: Understanding of nutrition guidelines, daily intake of sodium 1500mg , cholesterol 200mg , calories 30% from fat and 7% or less from saturated fats, daily to have 5 or more servings of fruits and vegetables.  Biometrics:     Pre Biometrics - 08/03/16 1441      Pre Biometrics   Height 5' 10.5" (1.791 m)   Weight 240 lb 9.6 oz (109.1 kg)   Waist Circumference 44 inches   Hip Circumference 47.5 inches   Waist to Hip Ratio 0.93 %   BMI (Calculated) 34.1   Single Leg Stand 26.57 seconds       Nutrition Therapy Plan and Nutrition Goals:     Nutrition Therapy & Goals - 08/03/16 1359      Intervention Plan   Intervention Prescribe, educate and counsel regarding individualized specific dietary modifications aiming towards targeted core components such  as weight, hypertension, lipid management, diabetes, heart failure and other comorbidities.;Nutrition handout(s) given to patient.   Expected Outcomes Short Term Goal: Understand basic principles of dietary content, such as calories, fat, sodium, cholesterol and nutrients.;Short Term Goal: A plan has been developed with personal nutrition goals set during dietitian appointment.;Long Term Goal: Adherence to prescribed nutrition plan.      Nutrition Discharge: Rate Your Plate Scores:     Nutrition Assessments - 08/03/16 1254      MEDFICTS Scores   Pre Score 29      Nutrition Goals Re-Evaluation:   Nutrition Goals Discharge (Final Nutrition Goals Re-Evaluation):   Psychosocial: Target Goals: Acknowledge presence or absence of significant depression and/or stress, maximize coping skills, provide positive support system. Participant is able to verbalize types and ability to use techniques and skills needed for reducing stress and depression.   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 She gets very short of breath with activity and would like to be able to do more things around the house without taking breaks. She also states she has some mild anxiety  and times of feeling sorry for herself and getting emotional but her son helps her get through them fairly quickly.      Family Dynamics   Good Support System? Yes   Comments Her 63 year old son is her main support as well as some co-workers. Her older son is overseas in [place] where she will be visiting this summer. Her ex-husband is her emergency contact.      Barriers   Psychosocial barriers  to participate in program The patient should benefit from training in stress management and relaxation.     Screening Interventions   Interventions Program counselor consult;Encouraged to exercise;To provide support and resources with identified psychosocial needs;Provide feedback about the scores to participant      Quality of Life Scores:      Quality of Life - 08/03/16 1252      Quality of Life Scores   Health/Function Pre 21.14 %   Socioeconomic Pre 20.67 %   Psych/Spiritual Pre 22.67 %   Family Pre 28.5 %   GLOBAL Pre 22.33 %      PHQ-9: Recent Review Flowsheet Data    Depression screen [facility] 2/9 08/03/2016   Decreased Interest 0   Down, Depressed, Hopeless 1   PHQ - 2 Score 1   Altered sleeping 0   Tired, decreased energy 1   Change in appetite 0   Feeling bad or failure about yourself  1   Trouble concentrating 0   Moving slowly or fidgety/restless 0   Suicidal thoughts 0   PHQ-9 Score 3   Difficult doing work/chores Somewhat difficult     Interpretation of Total Score  Total Score Depression Severity:  1-4 = Minimal depression, 5-9 = Mild depression, 10-14 = Moderate depression, 15-19 = Moderately severe depression, 20-27 = Severe depression   Psychosocial Evaluation and Intervention:   Psychosocial Re-Evaluation:   Psychosocial Discharge (Final Psychosocial Re-Evaluation):   Vocational Rehabilitation: Provide vocational rehab assistance to qualifying candidates.   Vocational Rehab Evaluation & Intervention:     Vocational Rehab - 08/03/16 1405      Initial Vocational Rehab Evaluation & Intervention   Assessment shows need for Vocational Rehabilitation No      Education: Education Goals: Education classes will be provided on a weekly basis, covering required topics. Participant will state understanding/return demonstration of topics presented.  Learning Barriers/Preferences:     Learning Barriers/Preferences - 08/03/16 1405      Learning  Barriers/Preferences   Learning Barriers None   Learning Preferences Individual Instruction      Education Topics: General Nutrition Guidelines/Fats and Fiber: -Group instruction provided by verbal, written material, models and posters to present the general guidelines for heart healthy nutrition. Gives an explanation and review of dietary fats and fiber.   Controlling Sodium/Reading Food Labels: -Group verbal and written material supporting the discussion of sodium use in heart healthy nutrition. Review and explanation with models, verbal and written materials for utilization of the food label.   Exercise Physiology & Risk Factors: - Group verbal and written instruction with models to review the exercise physiology of the cardiovascular system and associated critical values. Details cardiovascular disease risk factors and the goals associated with each risk factor.   Aerobic Exercise & Resistance Training: - Gives group verbal and written discussion on the health impact of inactivity. On the components of aerobic and resistive training programs and the benefits of this training and how to safely progress through these programs.   Flexibility, Balance, General Exercise  Guidelines: - Provides group verbal and written instruction on the benefits of flexibility and balance training programs. Provides general exercise guidelines with specific guidelines to those with heart or lung disease. Demonstration and skill practice provided.   Stress Management: - Provides group verbal and written instruction about the health risks of elevated stress, cause of high stress, and healthy ways to reduce stress.   Depression: - Provides group verbal and written instruction on the correlation between heart/lung disease and depressed mood, treatment options, and the stigmas associated with seeking treatment.   Anatomy & Physiology of the Heart: - Group verbal and written instruction and models provide  basic cardiac anatomy and physiology, with the coronary electrical and arterial systems. Review of: AMI, Angina, Valve disease, Heart Failure, Cardiac Arrhythmia, Pacemakers, and the ICD.   Cardiac Procedures: - Group verbal and written instruction and models to describe the testing methods done to diagnose heart disease. Reviews the outcomes of the test results. Describes the treatment choices: Medical Management, Angioplasty, or Coronary Bypass Surgery.   Cardiac Medications: - Group verbal and written instruction to review commonly prescribed medications for heart disease. Reviews the medication, class of the drug, and side effects. Includes the steps to properly store meds and maintain the prescription regimen.   Go Sex-Intimacy & Heart Disease, Get SMART - Goal Setting: - Group verbal and written instruction through game format to discuss heart disease and the return to sexual intimacy. Provides group verbal and written material to discuss and apply goal setting through the application of the S.M.A.R.T. Method.   Other Matters of the Heart: - Provides group verbal, written materials and models to describe Heart Failure, Angina, Valve Disease, and Diabetes in the realm of heart disease. Includes description of the disease process and treatment options available to the cardiac patient.   Exercise & Equipment Safety: - Individual verbal instruction and demonstration of equipment use and safety with use of the equipment.   Cardiac Rehab from 08/03/2016 in [facility] Cardiac and Pulmonary Rehab  Date  08/03/16  Educator  KS  Instruction Review Code  2- meets goals/outcomes      Infection Prevention: - Provides verbal and written material to individual with discussion of infection control including proper hand washing and proper equipment cleaning during exercise session.   Cardiac Rehab from 08/03/2016 in [facility] Cardiac and Pulmonary Rehab  Date  08/03/16  Educator  KS  Instruction Review Code   2- meets goals/outcomes      Falls Prevention: - Provides verbal and written material to individual with discussion of falls prevention and safety.   Cardiac Rehab from 08/03/2016 in [facility] Cardiac and Pulmonary Rehab  Date  08/03/16  Educator  KS  Instruction Review Code  2- meets goals/outcomes      Diabetes: - Individual verbal and written instruction to review signs/symptoms of diabetes, desired ranges of glucose level fasting, after meals and with exercise. Advice that pre and post exercise glucose checks will be done for 3 sessions at entry of program.    Knowledge Questionnaire Score:     Knowledge Questionnaire Score - 08/03/16 1254      Knowledge Questionnaire Score   Pre Score 23/28  Correct responses reviewed with patient with verbal feedback.       Core Components/Risk Factors/Patient Goals at Admission:     Personal Goals and Risk Factors at Admission - 08/03/16 1502      Core Components/Risk Factors/Patient Goals on Admission   Goal Weight: Short Term 185 lb (83.9  kg)      Core Components/Risk Factors/Patient Goals Review:    Core Components/Risk Factors/Patient Goals at Discharge (Final Review):    ITP Comments:     ITP Comments    Row Name 08/09/16 0932           ITP Comments 30 day review. Continue with ITP unless directed changes per Medical Director review New to program          Comments:

## 2016-08-16 DIAGNOSIS — I214 Non-ST elevation (NSTEMI) myocardial infarction: Secondary | ICD-10-CM

## 2016-08-16 DIAGNOSIS — I252 Old myocardial infarction: Secondary | ICD-10-CM | POA: Diagnosis not present

## 2016-08-16 NOTE — Progress Notes (Signed)
Daily Session Note  Patient Details  Name: Deanna Moran MRN: [id number] Date of Birth: 07/05/1965 Referring Provider:     Cardiac Rehab from 08/03/2016 in [facility] Cardiac and Pulmonary Rehab  Referring Provider  End      Encounter Date: 08/16/2016  Check In:     Session Check In - 08/16/16 1647      Check-In   Location [facility]-Cardiac & Pulmonary Rehab   Staff Present Carroll Enterkin, RN, BSN;Amanda Sommer, BA, ACSM CEP, Exercise Physiologist;Other  Meredith Craven RN   Supervising physician immediately available to respond to emergencies See telemetry face sheet for immediately available ER MD   Medication changes reported     No   Fall or balance concerns reported    No   Tobacco Cessation No Change   Warm-up and Cool-down Performed on first and last piece of equipment   Resistance Training Performed Yes   VAD Patient? No     Pain Assessment   Currently in Pain? No/denies         History  Smoking Status  . Current Every Day Smoker  . Packs/day: 0.50  . Years: 38.00  . Types: Cigarettes  Smokeless Tobacco  . Never Used    Comment: states she is smoking less than before her NSTEMI. Son is limiting her cigarettes and only giving her 5-10 a day depending on how stressed she is. She would like to quit but has tried multiple methods and has either bad interactions or unsuccessful.    Goals Met:  Independence with exercise equipment Exercise tolerated well No report of cardiac concerns or symptoms Strength training completed today  Goals Unmet:  Not Applicable  Comments: First full day of exercise!  Patient was oriented to gym and equipment including functions, settings, policies, and procedures.  Patient's individual exercise prescription and treatment plan were reviewed.  All starting workloads were established based on the results of the 6 minute walk test done at initial orientation visit.  The plan for exercise progression was also introduced and progression will  be customized based on patient's performance and goals.    Dr. Mark Miller is Medical Director for [facility] and [facility]. 

## 2016-08-17 ENCOUNTER — Encounter: Payer: Managed Care, Other (non HMO) | Admitting: *Deleted

## 2016-08-17 DIAGNOSIS — I214 Non-ST elevation (NSTEMI) myocardial infarction: Secondary | ICD-10-CM

## 2016-08-17 DIAGNOSIS — I252 Old myocardial infarction: Secondary | ICD-10-CM | POA: Diagnosis not present

## 2016-08-17 NOTE — Progress Notes (Signed)
Daily Session Note  Patient Details  Name: Deanna Moran MRN: [id number] Date of Birth: March 08, 1966 Referring Provider:     Cardiac Rehab from 08/03/2016 in [facility] Cardiac and Pulmonary Rehab  Referring Provider  End      Encounter Date: 08/17/2016  Check In:     Session Check In - 08/17/16 1717      Check-In   Staff Present Heath Lark, RN, BSN, CCRP;Carroll Enterkin, RN, Vickki Hearing, BA, ACSM CEP, Exercise Physiologist   Supervising physician immediately available to respond to emergencies See telemetry face sheet for immediately available ER MD   Medication changes reported     No   Fall or balance concerns reported    No   Tobacco Cessation Use Increase   Warm-up and Cool-down Performed on first and last piece of equipment   Resistance Training Performed Yes   VAD Patient? No           Exercise Prescription Changes - 08/17/16 1100      Response to Exercise   Blood Pressure (Admit) 126/68   Blood Pressure (Exercise) 122/68   Blood Pressure (Exit) 138/70   Heart Rate (Admit) 71 bpm   Heart Rate (Exercise) 98 bpm   Heart Rate (Exit) 68 bpm   Rating of Perceived Exertion (Exercise) 12     Resistance Training   Training Prescription Yes   Weight 2   Reps 10-15     NuStep   Level 3   SPM 80   Minutes 15   METs 2.4     REL-XR   Level 3   Speed 50   Minutes 15   METs 2.5      History  Smoking Status  . Current Every Day Smoker  . Packs/day: 0.50  . Years: 38.00  . Types: Cigarettes  Smokeless Tobacco  . Never Used    Comment: states she is smoking less than before her NSTEMI. Son is limiting her cigarettes and only giving her 5-10 a day depending on how stressed she is. She would like to quit but has tried multiple methods and has either bad interactions or unsuccessful.    Goals Met:  Exercise tolerated well No report of cardiac concerns or symptoms Strength training completed today  Goals Unmet:  Not Applicable  Comments: Doing well  with exercise prescription progression.    Dr. Emily Filbert is Medical Director for [place] and [facility].

## 2016-08-21 ENCOUNTER — Encounter: Payer: Managed Care, Other (non HMO) | Attending: Internal Medicine | Admitting: *Deleted

## 2016-08-21 DIAGNOSIS — F17210 Nicotine dependence, cigarettes, uncomplicated: Secondary | ICD-10-CM | POA: Insufficient documentation

## 2016-08-21 DIAGNOSIS — Z7902 Long term (current) use of antithrombotics/antiplatelets: Secondary | ICD-10-CM | POA: Diagnosis not present

## 2016-08-21 DIAGNOSIS — I214 Non-ST elevation (NSTEMI) myocardial infarction: Secondary | ICD-10-CM

## 2016-08-21 DIAGNOSIS — I10 Essential (primary) hypertension: Secondary | ICD-10-CM | POA: Diagnosis not present

## 2016-08-21 DIAGNOSIS — E669 Obesity, unspecified: Secondary | ICD-10-CM | POA: Insufficient documentation

## 2016-08-21 DIAGNOSIS — I2510 Atherosclerotic heart disease of native coronary artery without angina pectoris: Secondary | ICD-10-CM | POA: Insufficient documentation

## 2016-08-21 DIAGNOSIS — E785 Hyperlipidemia, unspecified: Secondary | ICD-10-CM | POA: Insufficient documentation

## 2016-08-21 DIAGNOSIS — Z7982 Long term (current) use of aspirin: Secondary | ICD-10-CM | POA: Insufficient documentation

## 2016-08-21 DIAGNOSIS — Z79899 Other long term (current) drug therapy: Secondary | ICD-10-CM | POA: Diagnosis not present

## 2016-08-21 DIAGNOSIS — I252 Old myocardial infarction: Secondary | ICD-10-CM | POA: Diagnosis not present

## 2016-08-21 NOTE — Progress Notes (Signed)
Daily Session Note  Patient Details  Name: Deanna Moran MRN: [id number] Date of Birth: [birth year] Referring Provider:     Cardiac Rehab from 08/03/2016 in [facility] Cardiac and Pulmonary Rehab  Referring Provider  End      Encounter Date: 08/21/2016  Check In:     Session Check In - 08/21/16 1635      Check-In   Location [facility]-Cardiac & Pulmonary Rehab   Staff Present Heath Lark, RN, BSN, Laveda Norman, BS, ACSM CEP, Exercise Physiologist;Krista Frederico Hamman, RN BSN   Supervising physician immediately available to respond to emergencies See telemetry face sheet for immediately available ER MD   Medication changes reported     No   Fall or balance concerns reported    No   Tobacco Cessation Use Decreased  down to 5-10 cigarettes a day   Warm-up and Cool-down Performed on first and last piece of equipment   Resistance Training Performed Yes   VAD Patient? No     Pain Assessment   Currently in Pain? No/denies   Multiple Pain Sites No           Exercise Prescription Changes - 08/21/16 1600      Resistance Training   Training Prescription Yes   Weight 2   Reps 10-15     NuStep   Level 3   SPM 80   Minutes 15   METs 2.4     REL-XR   Level 3   Speed 50   Minutes 15   METs 2.5     Home Exercise Plan   Plans to continue exercise at Home (comment)   Frequency Add 2 additional days to program exercise sessions.   Initial Home Exercises Provided 08/21/16      History  Smoking Status  . Current Every Day Smoker  . Packs/day: 0.50  . Years: 38.00  . Types: Cigarettes  Smokeless Tobacco  . Never Used    Comment: states she is smoking less than before her NSTEMI. Son is limiting her cigarettes and only giving her 5-10 a day depending on how stressed she is. She would like to quit but has tried multiple methods and has either bad interactions or unsuccessful.    Goals Met:  Independence with exercise equipment Exercise tolerated well Personal goals reviewed No  report of cardiac concerns or symptoms Strength training completed today  Goals Unmet:  Not Applicable  Comments: Reviewed home exercise with pt today.  Pt plans to use a gym while on vacation and walk once she gets home for exercise.  Reviewed THR, pulse, RPE, sign and symptoms, NTG use, and when to call 911 or MD.  Also discussed weather considerations and indoor options.  Pt voiced understanding.    Dr. Emily Filbert is Medical Director for [place] and [facility].

## 2016-08-23 ENCOUNTER — Encounter: Payer: Managed Care, Other (non HMO) | Admitting: *Deleted

## 2016-08-23 DIAGNOSIS — I214 Non-ST elevation (NSTEMI) myocardial infarction: Secondary | ICD-10-CM

## 2016-08-23 DIAGNOSIS — I252 Old myocardial infarction: Secondary | ICD-10-CM | POA: Diagnosis not present

## 2016-08-23 NOTE — Progress Notes (Signed)
Daily Session Note  Patient Details  Name: Deanna Moran MRN: [id number] Date of Birth: 1965/05/28 Referring Provider:     Cardiac Rehab from 08/03/2016 in [facility] Cardiac and Pulmonary Rehab  Referring Provider  End      Encounter Date: 08/23/2016  Check In:     Session Check In - 08/23/16 1659      Check-In   Location [facility]-Cardiac & Pulmonary Rehab   Staff Present Gerlene Burdock, RN, Vickki Hearing, BA, ACSM CEP, Exercise Physiologist;Krista Frederico Hamman, RN BSN   Supervising physician immediately available to respond to emergencies See telemetry face sheet for immediately available ER MD   Medication changes reported     No   Fall or balance concerns reported    No   Tobacco Cessation No Change   Warm-up and Cool-down Performed on first and last piece of equipment   Resistance Training Performed Yes   VAD Patient? No     Pain Assessment   Currently in Pain? No/denies         History  Smoking Status  . Current Every Day Smoker  . Packs/day: 0.50  . Years: 38.00  . Types: Cigarettes  Smokeless Tobacco  . Never Used    Comment: states she is smoking less than before her NSTEMI. Son is limiting her cigarettes and only giving her 5-10 a day depending on how stressed she is. She would like to quit but has tried multiple methods and has either bad interactions or unsuccessful.    Goals Met:  Proper associated with RPD/PD & O2 Sat Exercise tolerated well No report of cardiac concerns or symptoms  Goals Unmet:  Not Applicable  Comments:     Dr. Emily Filbert is Medical Director for [place] and [facility].

## 2016-08-24 DIAGNOSIS — I252 Old myocardial infarction: Secondary | ICD-10-CM | POA: Diagnosis not present

## 2016-08-24 DIAGNOSIS — I214 Non-ST elevation (NSTEMI) myocardial infarction: Secondary | ICD-10-CM

## 2016-08-24 NOTE — Progress Notes (Signed)
Daily Session Note  Patient Details  Name: Deanna Moran MRN: [id number] Date of Birth: 11/28/1965 Referring Provider:     Cardiac Rehab from 08/03/2016 in [facility] Cardiac and Pulmonary Rehab  Referring Provider  End      Encounter Date: 08/24/2016  Check In:     Session Check In - 08/24/16 1711      Check-In   Location [facility]-Cardiac & Pulmonary Rehab   Staff Present Susanne Bice, RN, BSN, CCRP;Kelly Hayes, BS, ACSM CEP, Exercise Physiologist; , BA, ACSM CEP, Exercise Physiologist   Supervising physician immediately available to respond to emergencies See telemetry face sheet for immediately available ER MD   Medication changes reported     No   Fall or balance concerns reported    No   Tobacco Cessation No Change   Warm-up and Cool-down Performed on first and last piece of equipment   Resistance Training Performed Yes   VAD Patient? No     Pain Assessment   Currently in Pain? No/denies         History  Smoking Status  . Current Every Day Smoker  . Packs/day: 0.50  . Years: 38.00  . Types: Cigarettes  Smokeless Tobacco  . Never Used    Comment: states she is smoking less than before her NSTEMI. Son is limiting her cigarettes and only giving her 5-10 a day depending on how stressed she is. She would like to quit but has tried multiple methods and has either bad interactions or unsuccessful.    Goals Met:  Independence with exercise equipment Exercise tolerated well No report of cardiac concerns or symptoms Strength training completed today  Goals Unmet:  Not Applicable  Comments: Pt able to follow exercise prescription today without complaint.  Will continue to monitor for progression.    Dr. Mark Miller is Medical Director for [facility] and [facility]. 

## 2016-08-28 ENCOUNTER — Encounter: Payer: Managed Care, Other (non HMO) | Admitting: *Deleted

## 2016-08-28 DIAGNOSIS — I214 Non-ST elevation (NSTEMI) myocardial infarction: Secondary | ICD-10-CM

## 2016-08-28 DIAGNOSIS — I252 Old myocardial infarction: Secondary | ICD-10-CM | POA: Diagnosis not present

## 2016-08-28 NOTE — Progress Notes (Signed)
Daily Session Note  Patient Details  Name: Deanna Moran MRN: [id number] Date of Birth: [birth year] Referring Provider:     Cardiac Rehab from 08/03/2016 in [facility] Cardiac and Pulmonary Rehab  Referring Provider  End      Encounter Date: 08/28/2016  Check In:     Session Check In - 08/28/16 1745      Check-In   Location [facility]-Cardiac & Pulmonary Rehab   Staff Present Renita Papa, RN Moises Blood, BS, ACSM CEP, Exercise Physiologist;Susanne Bice, RN, BSN, CCRP   Supervising physician immediately available to respond to emergencies See telemetry face sheet for immediately available ER MD   Medication changes reported     No   Fall or balance concerns reported    No   Tobacco Cessation Use Decreased  1/2 pack/day   Warm-up and Cool-down Performed on first and last piece of equipment   Resistance Training Performed Yes   VAD Patient? No     Pain Assessment   Currently in Pain? No/denies   Multiple Pain Sites No         History  Smoking Status  . Current Every Day Smoker  . Packs/day: 0.50  . Years: 38.00  . Types: Cigarettes  Smokeless Tobacco  . Never Used    Comment: states she is smoking less than before her NSTEMI. Son is limiting her cigarettes and only giving her 5-10 a day depending on how stressed she is. She would like to quit but has tried multiple methods and has either bad interactions or unsuccessful.    Goals Met:  Independence with exercise equipment Exercise tolerated well No report of cardiac concerns or symptoms Strength training completed today  Goals Unmet:  Not Applicable  Comments: Pt able to follow exercise prescription today without complaint.  Will continue to monitor for progression.    Dr. Emily Filbert is Medical Director for [place] and [facility].

## 2016-08-30 ENCOUNTER — Encounter: Payer: Self-pay | Admitting: Internal Medicine

## 2016-08-30 ENCOUNTER — Ambulatory Visit (INDEPENDENT_AMBULATORY_CARE_PROVIDER_SITE_OTHER): Payer: Managed Care, Other (non HMO) | Admitting: Internal Medicine

## 2016-08-30 ENCOUNTER — Other Ambulatory Visit: Payer: Self-pay | Admitting: Internal Medicine

## 2016-08-30 VITALS — BP 118/70 | HR 70 | Ht 69.0 in | Wt 230.5 lb

## 2016-08-30 DIAGNOSIS — E785 Hyperlipidemia, unspecified: Secondary | ICD-10-CM

## 2016-08-30 DIAGNOSIS — R238 Other skin changes: Secondary | ICD-10-CM | POA: Diagnosis not present

## 2016-08-30 DIAGNOSIS — I2510 Atherosclerotic heart disease of native coronary artery without angina pectoris: Secondary | ICD-10-CM | POA: Diagnosis not present

## 2016-08-30 DIAGNOSIS — I5022 Chronic systolic (congestive) heart failure: Secondary | ICD-10-CM

## 2016-08-30 DIAGNOSIS — I255 Ischemic cardiomyopathy: Secondary | ICD-10-CM

## 2016-08-30 DIAGNOSIS — I10 Essential (primary) hypertension: Secondary | ICD-10-CM

## 2016-08-30 DIAGNOSIS — R233 Spontaneous ecchymoses: Secondary | ICD-10-CM

## 2016-08-30 NOTE — Patient Instructions (Addendum)
Medication Instructions:  Your physician recommends that you continue on your current medications as directed. Please refer to the Current Medication list given to you today.   Labwork: Your physician recommends that you return for lab work in: AT [place] will need to be FASTING.  - Please have them fax results to [phone].  Testing/Procedures: none  Follow-Up: Your physician recommends that you schedule a follow-up appointment in: 3 MONTHS WITH DR END.  If you need a refill on your cardiac medications before your next appointment, please call your pharmacy.

## 2016-08-30 NOTE — Progress Notes (Signed)
Follow-up Outpatient Visit Date: 08/30/2016  Primary Care Provider: Patient, No Pcp Per No address on file  Chief Complaint: Follow-up recent hospitalization for NSTEMI  HPI:  Deanna Moran is a 51 y.o. year-old female with history of CAD with recent NSTEMI (06/2016), ischemic cardiomyopathy, hypertension, hyperlipidemia, and tobacco use, who presents for follow-up of coronary artery disease. She underwent urgent left heart catheterization, demonstrating subacute versus chronic total occlusion of the proximal LAD. Medical management was advised. I last saw her on 08/02/16, at which time she was recovering well, though she reported considerable fatigue. Over the last month, the fatigue has resolved. She denies chest pain, shortness of breath, palpitations, lightheadedness, edema, orthopnea, PND, and claudication. She has not had any significant bleeding, though she bruises very easily. She remains compliant with her medication, including ASA and clopidogrel. She is participating in cardiac rehab without difficulty and happily reports that she has lost ~11 pounds since her heart attack. She is traveling to [place] tomorrow for 2 weeks to visit her son, who is stationed there.  --------------------------------------------------------------------------------------------------  Cardiovascular History & Procedures: Cardiovascular Problems:  Coronary artery disease status post NSTEMI  Ischemic cardiomyopathy  Risk Factors:  Known coronary artery disease, hypertension, hyperlipidemia, and tobacco use  Cath/PCI:  LHC (07/11/16): Significant single vessel CAD with subacute versus chronic total occlusion of the ostial LAD with left to left and right-to-left collaterals. Mild to moderate nonobstructive CAD involving the LCx and RCA. LVEF 30-35% with anterior and apical wall motion abnormality.  CV Surgery:  None  EP Procedures and Devices:  None  Non-Invasive Evaluation(s):  TTE (07/12/16):  Technically difficult study. LVEF 40-45% with anterior hypokinesis and grade 1 diastolic dysfunction. Mild MR. Mild left atrial enlargement. Normal RV size and function.  Recent CV Pertinent Labs: Lab Results  Component Value Date   CHOL 144 07/11/2016   HDL 31 (L) 07/11/2016   LDLCALC 103 (H) 07/11/2016   TRIG 52 07/11/2016   CHOLHDL 4.6 07/11/2016   INR 1.00 07/10/2016   K 4.4 08/03/2016   BUN 10 08/03/2016   CREATININE 0.87 08/03/2016    Past medical and surgical history were reviewed and updated in EPIC.  Outpatient Encounter Prescriptions as of 08/30/2016  Medication Sig  . aspirin EC 81 MG EC tablet Take 1 tablet (81 mg total) by mouth daily.  Marland Kitchen atorvastatin (LIPITOR) 80 MG tablet Take 1 tablet (80 mg total) by mouth daily at 6 PM.  . carvedilol (COREG) 3.125 MG tablet Take 1 tablet (3.125 mg total) by mouth 2 (two) times daily with a meal.  . clopidogrel (PLAVIX) 75 MG tablet Take 1 tablet (75 mg total) by mouth daily with breakfast.  . losartan (COZAAR) 25 MG tablet Take 1 tablet (25 mg total) by mouth daily.  . naproxen sodium (ANAPROX) 220 MG tablet Take 220 mg by mouth as needed.  . nitroGLYCERIN (NITROSTAT) 0.4 MG SL tablet Place 1 tablet (0.4 mg total) under the tongue every 5 (five) minutes x 3 doses as needed for chest pain.  . prednisoLONE acetate (PRED FORTE) 1 % ophthalmic suspension Place 2 drops into both eyes 4 (four) times daily.   No facility-administered encounter medications on file as of 08/30/2016.     Allergies: Patient has no known allergies.  Social History   Social History  . Marital status: Divorced    Spouse name: N/A  . Number of children: N/A  . Years of education: N/A   Occupational History  . Not on file.  Social History Main Topics  . Smoking status: Current Every Day Smoker    Packs/day: 0.50    Years: 38.00    Types: Cigarettes  . Smokeless tobacco: Never Used     Comment: states she is smoking less than before her NSTEMI. Son  is limiting her cigarettes and only giving her 5-10 a day depending on how stressed she is. She would like to quit but has tried multiple methods and has either bad interactions or unsuccessful.  . Alcohol use No  . Drug use: Unknown  . Sexual activity: Not on file   Other Topics Concern  . Not on file   Social History Narrative  . No narrative on file    Family History  Problem Relation Age of Onset  . Heart attack Father   . Anuerysm Brother        brain    Review of Systems: A 12-system review of systems was performed and was negative except as noted in the HPI.  --------------------------------------------------------------------------------------------------  Physical Exam: BP 118/70 (BP Location: Left Arm, Patient Position: Sitting, Cuff Size: Normal)   Pulse 70   Ht 5' 9"  (1.753 m)   Wt 230 lb 8 oz (104.6 kg)   BMI 34.04 kg/m   General:  Obese woman, seated comfortably in the exam room. HEENT: No conjunctival pallor or scleral icterus.  Moist mucous membranes.  OP clear. Neck: Supple without lymphadenopathy, thyromegaly, JVD, or HJR.  No carotid bruit. Lungs: Normal work of breathing.  Clear to auscultation bilaterally without wheezes or crackles. Heart: Regular rate and rhythm without murmurs, rubs, or gallops.  Non-displaced PMI. Abd: Bowel sounds present.  Soft, NT/ND without hepatosplenomegaly Ext: No lower extremity edema.  Radial, PT, and DP pulses are 2+ bilaterally. Right radial arteriotomy and previously noted wrist ulcer are healed. Skin: Warm and dry without rash.  Lab Results  Component Value Date   WBC 9.9 07/10/2016   HGB 13.1 07/10/2016   HCT 39.5 07/10/2016   MCV 94.6 07/10/2016   PLT 222 07/10/2016    Lab Results  Component Value Date   NA 142 08/03/2016   K 4.4 08/03/2016   CL 101 08/03/2016   CO2 24 08/03/2016   BUN 10 08/03/2016   CREATININE 0.87 08/03/2016   GLUCOSE 96 08/03/2016    Lab Results  Component Value Date   CHOL 144  07/11/2016   HDL 31 (L) 07/11/2016   LDLCALC 103 (H) 07/11/2016   TRIG 52 07/11/2016   CHOLHDL 4.6 07/11/2016    --------------------------------------------------------------------------------------------------  ASSESSMENT AND PLAN: Coronary artery disease without angina Deanna Moran has recovered well from her recent NSTEMI with subtotal occlusion of the proximal LAD. We will continue our current medication regimen, including 1 year of DAPT. Given her easy bruising, I will check a CBC today to ensure that she is not developing thrombocytopenia. We will continue with secondary prevention as below. Smoking cessation was encouraged. She should continue with cardiac rehab once she returns from [place].  Chronic systolic heart failure secondary to ischemic cardiomyopathy Since our last visit, fatigue has resolved. Deanna Moran appears euvolemic and well-compensated today with NYHA class I-II symptoms. She is tolerating low-dose carvedilol and losartan well. Given that she is going out of town tomorrow, we will defer uptitration of these agents until she returns for follow-up.  Hypertension Blood pressure is upper normal today. No medication changes at this time.  Hyperlipidemia Deanna Moran is tolerating high-intensity statin therapy well. We will check a CMP and  lipid panel today to assess response.  Follow-up: Return to clinic in 3 months.   Nelva Bush, MD 08/30/2016 8:50 PM

## 2016-08-31 ENCOUNTER — Other Ambulatory Visit: Payer: Self-pay | Admitting: *Deleted

## 2016-08-31 DIAGNOSIS — E785 Hyperlipidemia, unspecified: Secondary | ICD-10-CM

## 2016-08-31 DIAGNOSIS — Z79899 Other long term (current) drug therapy: Secondary | ICD-10-CM

## 2016-08-31 DIAGNOSIS — R7401 Elevation of levels of liver transaminase levels: Secondary | ICD-10-CM

## 2016-08-31 DIAGNOSIS — R740 Nonspecific elevation of levels of transaminase and lactic acid dehydrogenase [LDH]: Secondary | ICD-10-CM

## 2016-08-31 DIAGNOSIS — I10 Essential (primary) hypertension: Secondary | ICD-10-CM

## 2016-08-31 LAB — CBC WITH DIFFERENTIAL/PLATELET
BASOS ABS: 0 10*3/uL (ref 0.0–0.2)
BASOS: 0 %
EOS (ABSOLUTE): 0.2 10*3/uL (ref 0.0–0.4)
Eos: 3 %
HEMOGLOBIN: 12.7 g/dL (ref 11.1–15.9)
Hematocrit: 38.6 % (ref 34.0–46.6)
IMMATURE GRANS (ABS): 0 10*3/uL (ref 0.0–0.1)
Immature Granulocytes: 0 %
LYMPHS: 28 %
Lymphocytes Absolute: 1.7 10*3/uL (ref 0.7–3.1)
MCH: 28.6 pg (ref 26.6–33.0)
MCHC: 32.9 g/dL (ref 31.5–35.7)
MCV: 87 fL (ref 79–97)
MONOCYTES: 8 %
Monocytes Absolute: 0.5 10*3/uL (ref 0.1–0.9)
NEUTROS ABS: 3.9 10*3/uL (ref 1.4–7.0)
NEUTROS PCT: 61 %
Platelets: 245 10*3/uL (ref 150–379)
RBC: 4.44 x10E6/uL (ref 3.77–5.28)
RDW: 14 % (ref 12.3–15.4)
WBC: 6.3 10*3/uL (ref 3.4–10.8)

## 2016-08-31 LAB — LDL CHOLESTEROL, DIRECT: LDL DIRECT: 76 mg/dL (ref 0–99)

## 2016-08-31 LAB — LIPID PANEL
CHOL/HDL RATIO: 3.8 ratio (ref 0.0–4.4)
Cholesterol, Total: 120 mg/dL (ref 100–199)
HDL: 32 mg/dL — ABNORMAL LOW (ref >39)
LDL Calculated: 74 mg/dL (ref 0–99)
TRIGLYCERIDES: 68 mg/dL (ref 0–149)
VLDL Cholesterol Cal: 14 mg/dL (ref 5–40)

## 2016-08-31 LAB — ALT: ALT: 35 IU/L — ABNORMAL HIGH (ref 0–32)

## 2016-09-06 ENCOUNTER — Encounter: Payer: Self-pay | Admitting: *Deleted

## 2016-09-06 DIAGNOSIS — I214 Non-ST elevation (NSTEMI) myocardial infarction: Secondary | ICD-10-CM

## 2016-09-06 NOTE — Progress Notes (Signed)
Cardiac Individual Treatment Plan  Patient Details  Name: Deanna Moran MRN: [id number] Date of Birth: 22-Nov-1965 Referring Provider:     Cardiac Rehab from 08/03/2016 in [facility] Cardiac and Pulmonary Rehab  Referring Provider  End      Initial Encounter Date:    Cardiac Rehab from 08/03/2016 in [facility] Cardiac and Pulmonary Rehab  Date  08/03/16  Referring Provider  End      Visit Diagnosis: NSTEMI (non-ST elevated myocardial infarction) [facility])  Patient's Home Medications on Admission:  Current Outpatient Prescriptions:  .  aspirin EC 81 MG EC tablet, Take 1 tablet (81 mg total) by mouth daily., Disp: 30 tablet, Rfl: 3 .  atorvastatin (LIPITOR) 80 MG tablet, Take 1 tablet (80 mg total) by mouth daily at 6 PM., Disp: 30 tablet, Rfl: 3 .  carvedilol (COREG) 3.125 MG tablet, Take 1 tablet (3.125 mg total) by mouth 2 (two) times daily with a meal., Disp: 60 tablet, Rfl: 2 .  clopidogrel (PLAVIX) 75 MG tablet, Take 1 tablet (75 mg total) by mouth daily with breakfast., Disp: 30 tablet, Rfl: 2 .  losartan (COZAAR) 25 MG tablet, Take 1 tablet (25 mg total) by mouth daily., Disp: 90 tablet, Rfl: 3 .  naproxen sodium (ANAPROX) 220 MG tablet, Take 220 mg by mouth as needed., Disp: , Rfl:  .  nitroGLYCERIN (NITROSTAT) 0.4 MG SL tablet, Place 1 tablet (0.4 mg total) under the tongue every 5 (five) minutes x 3 doses as needed for chest pain., Disp: 30 tablet, Rfl: 2 .  prednisoLONE acetate (PRED FORTE) 1 % ophthalmic suspension, Place 2 drops into both eyes 4 (four) times daily., Disp: , Rfl:   Past Medical History: Past Medical History:  Diagnosis Date  . Chronic back pain   . Coronary artery disease   . HLD (hyperlipidemia)   . HTN (hypertension)   . NSTEMI (non-ST elevated myocardial infarction) ([place]) 06/2016  . Obesity     Tobacco Use: History  Smoking Status  . Current Every Day Smoker  . Packs/day: 0.50  . Years: 38.00  . Types: Cigarettes  Smokeless Tobacco  . Never Used     Comment: states she is smoking less than before her NSTEMI. Son is limiting her cigarettes and only giving her 5-10 a day depending on how stressed she is. She would like to quit but has tried multiple methods and has either bad interactions or unsuccessful.    Labs: Recent Review Flowsheet Data    Labs for ITP Cardiac and Pulmonary Rehab Latest Ref Rng & Units 07/10/2016 07/11/2016 08/30/2016   Cholestrol 100 - 199 mg/dL - 144 120   LDLCALC 0 - 99 mg/dL - 103(H) 74   LDLDIRECT 0 - 99 mg/dL - - 76   HDL >39 mg/dL - 31(L) 32(L)   Trlycerides 0 - 149 mg/dL - 52 68   Hemoglobin A1c 4.8 - 5.6 % 5.7(H) - -       Exercise Target Goals:    Exercise Program Goal: Individual exercise prescription set with THRR, safety & activity barriers. Participant demonstrates ability to understand and report RPE using BORG scale, to self-measure pulse accurately, and to acknowledge the importance of the exercise prescription.  Exercise Prescription Goal: Starting with aerobic activity 30 plus minutes a day, 3 days per week for initial exercise prescription. Provide home exercise prescription and guidelines that participant acknowledges understanding prior to discharge.  Activity Barriers & Risk Stratification:     Activity Barriers & Cardiac Risk Stratification - 08/03/16  1355      Activity Barriers & Cardiac Risk Stratification   Activity Barriers Joint Problems;Back Problems  She has scoliosis in her back causing 24/7 pain in her back that she has learned to deal with. She also has pain in her right hip but is not a surgical candidate at this time.    Cardiac Risk Stratification High      6 Minute Walk:     6 Minute Walk    Row Name 08/03/16 1441         6 Minute Walk   Distance 1186 feet     Walk Time 6 minutes     # of Rest Breaks 0     MPH 2.24     METS 3.66     RPE 13     VO2 Peak 12.8     Symptoms Yes (comment)     Comments back pain 5/10     Resting HR 65 bpm     Resting BP  148/78     Max Ex. HR 108 bpm     Max Ex. BP 164/80     2 Minute Post BP 128/68        Oxygen Initial Assessment:   Oxygen Re-Evaluation:   Oxygen Discharge (Final Oxygen Re-Evaluation):   Initial Exercise Prescription:     Initial Exercise Prescription - 08/03/16 1400      Date of Initial Exercise RX and Referring Provider   Date 08/03/16   Referring Provider End     Treadmill   MPH 2.3   Grade 1   Minutes 15   METs 3.08     Recumbant Bike   Level 2   RPM 60   Watts 40   Minutes 15   METs 3.14     NuStep   Level 3   SPM 80   Minutes 15   METs 3     REL-XR   Level 3   Speed 50   Minutes 15   METs 3     Prescription Details   Frequency (times per week) 3   Duration Progress to 30 minutes of continuous aerobic without signs/symptoms of physical distress     Intensity   THRR 40-80% of Max Heartrate 106-147   Ratings of Perceived Exertion 11-13     Resistance Training   Training Prescription Yes   Weight 3   Reps 10-15      Perform Capillary Blood Glucose checks as needed.  Exercise Prescription Changes:     Exercise Prescription Changes    Row Name 08/03/16 1300 08/17/16 1100 08/21/16 1600 08/31/16 1400       Response to Exercise   Blood Pressure (Admit) 148/78 126/68  - 110/64    Blood Pressure (Exercise) 164/80 122/68  - 126/68    Blood Pressure (Exit) 128/68 138/70  - 102/64    Heart Rate (Admit) 76 bpm 71 bpm  - 48 bpm    Heart Rate (Exercise) 108 bpm 98 bpm  - 95 bpm    Heart Rate (Exit) 68 bpm 68 bpm  - 60 bpm    Rating of Perceived Exertion (Exercise) 13 12  - 12      Resistance Training   Training Prescription  - Yes Yes Yes    Weight  - 2 2 3     Reps  - 10-15 10-15 10-15      Treadmill   MPH  -  -  - 2.3    Grade  -  -  -  0    Minutes  -  -  - 15    METs  -  -  - 2.76      NuStep   Level  - 3 3 3     SPM  - 80 80 80    Minutes  - 15 15 15     METs  - 2.4 2.4 3      REL-XR   Level  - 3 3  -    Speed  - 50 50  -     Minutes  - 15 15  -    METs  - 2.5 2.5  -      Home Exercise Plan   Plans to continue exercise at  -  - Home (comment)  -    Frequency  -  - Add 2 additional days to program exercise sessions.  -    Initial Home Exercises Provided  -  - 08/21/16  -       Exercise Comments:     Exercise Comments    Row Name 08/16/16 1818 08/21/16 1655 08/31/16 1501       Exercise Comments First full day of exercise!  Patient was oriented to gym and equipment including functions, settings, policies, and procedures.  Patient's individual exercise prescription and treatment plan were reviewed.  All starting workloads were established based on the results of the 6 minute walk test done at initial orientation visit.  The plan for exercise progression was also introduced and progression will be customized based on patient's performance and goals. Reviewed home exercise with pt today.  Pt plans to 2 for exercise.  Reviewed THR, pulse, RPE, sign and symptoms, NTG use, and when to call 911 or MD.  Also discussed weather considerations and indoor options.  Pt voiced understanding. Reviewed these guidlines so soon due to patient leaving the country to travel.  Terrence will be on vacation the next couple weeks.  She has tolerated exercise very well in her first sessions.        Exercise Goals and Review:     Exercise Goals    Row Name 08/03/16 1441             Exercise Goals   Increase Physical Activity Yes       Intervention Provide advice, education, support and counseling about physical activity/exercise needs.;Develop an individualized exercise prescription for aerobic and resistive training based on initial evaluation findings, risk stratification, comorbidities and participant's personal goals.       Expected Outcomes Achievement of increased cardiorespiratory fitness and enhanced flexibility, muscular endurance and strength shown through measurements of functional capacity and personal statement of  participant.       Increase Strength and Stamina Yes       Intervention Provide advice, education, support and counseling about physical activity/exercise needs.;Develop an individualized exercise prescription for aerobic and resistive training based on initial evaluation findings, risk stratification, comorbidities and participant's personal goals.       Expected Outcomes Achievement of increased cardiorespiratory fitness and enhanced flexibility, muscular endurance and strength shown through measurements of functional capacity and personal statement of participant.          Exercise Goals Re-Evaluation :     Exercise Goals Re-Evaluation    Row Name 08/21/16 1651             Exercise Goal Re-Evaluation   Exercise Goals Review Increase Physical Activity       Comments Reviewed home exercise guidelines with  patient and recommended adding 2 extra days of exercise at home outside of class.        Expected Outcomes Patient will use these guideline to exercise safely at home.           Discharge Exercise Prescription (Final Exercise Prescription Changes):     Exercise Prescription Changes - 08/31/16 1400      Response to Exercise   Blood Pressure (Admit) 110/64   Blood Pressure (Exercise) 126/68   Blood Pressure (Exit) 102/64   Heart Rate (Admit) 48 bpm   Heart Rate (Exercise) 95 bpm   Heart Rate (Exit) 60 bpm   Rating of Perceived Exertion (Exercise) 12     Resistance Training   Training Prescription Yes   Weight 3   Reps 10-15     Treadmill   MPH 2.3   Grade 0   Minutes 15   METs 2.76     NuStep   Level 3   SPM 80   Minutes 15   METs 3      Nutrition:  Target Goals: Understanding of nutrition guidelines, daily intake of sodium <1574m, cholesterol <2018m calories 30% from fat and 7% or less from saturated fats, daily to have 5 or more servings of fruits and vegetables.  Biometrics:     Pre Biometrics - 08/03/16 1441      Pre Biometrics   Height 5' 10.5"  (1.791 m)   Weight 240 lb 9.6 oz (109.1 kg)   Waist Circumference 44 inches   Hip Circumference 47.5 inches   Waist to Hip Ratio 0.93 %   BMI (Calculated) 34.1   Single Leg Stand 26.57 seconds       Nutrition Therapy Plan and Nutrition Goals:     Nutrition Therapy & Goals - 08/03/16 1359      Intervention Plan   Intervention Prescribe, educate and counsel regarding individualized specific dietary modifications aiming towards targeted core components such as weight, hypertension, lipid management, diabetes, heart failure and other comorbidities.;Nutrition handout(s) given to patient.   Expected Outcomes Short Term Goal: Understand basic principles of dietary content, such as calories, fat, sodium, cholesterol and nutrients.;Short Term Goal: A plan has been developed with personal nutrition goals set during dietitian appointment.;Long Term Goal: Adherence to prescribed nutrition plan.      Nutrition Discharge: Rate Your Plate Scores:     Nutrition Assessments - 08/03/16 1254      MEDFICTS Scores   Pre Score 29      Nutrition Goals Re-Evaluation:   Nutrition Goals Discharge (Final Nutrition Goals Re-Evaluation):   Psychosocial: Target Goals: Acknowledge presence or absence of significant depression and/or stress, maximize coping skills, provide positive support system. Participant is able to verbalize types and ability to use techniques and skills needed for reducing stress and depression.   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 She gets very short of breath with activity and would like to be able to do more things around the  house without taking breaks. She also states she has some mild anxiety and times of feeling sorry for herself and getting emotional but her son helps her get  through them fairly quickly.      Family Dynamics   Good Support System? Yes   Comments Her 74 year old son is her main support as well as some co-workers. Her older son is overseas in [place] where she will be visiting this summer. Her ex-husband is her emergency contact.      Barriers   Psychosocial barriers to participate in program The patient should benefit from training in stress management and relaxation.     Screening Interventions   Interventions Program counselor consult;Encouraged to exercise;To provide support and resources with identified psychosocial needs;Provide feedback about the scores to participant      Quality of Life Scores:      Quality of Life - 08/03/16 1252      Quality of Life Scores   Health/Function Pre 21.14 %   Socioeconomic Pre 20.67 %   Psych/Spiritual Pre 22.67 %   Family Pre 28.5 %   GLOBAL Pre 22.33 %      PHQ-9: Recent Review Flowsheet Data    Depression screen [facility] 2/9 08/03/2016   Decreased Interest 0   Down, Depressed, Hopeless 1   PHQ - 2 Score 1   Altered sleeping 0   Tired, decreased energy 1   Change in appetite 0   Feeling bad or failure about yourself  1   Trouble concentrating 0   Moving slowly or fidgety/restless 0   Suicidal thoughts 0   PHQ-9 Score 3   Difficult doing work/chores Somewhat difficult     Interpretation of Total Score  Total Score Depression Severity:  1-4 = Minimal depression, 5-9 = Mild depression, 10-14 = Moderate depression, 15-19 = Moderately severe depression, 20-27 = Severe depression   Psychosocial Evaluation and Intervention:     Psychosocial Evaluation - 08/23/16 1707      Psychosocial Evaluation & Interventions   Interventions Encouraged to exercise with the program and follow exercise prescription;Stress management  education;Relaxation education   Comments Counselor met with [place] today for intial psychosocial evaluation.  She is a 51 year old who had a heart attack in April.  Jonia has a strong support system with her 90 year old son living at home with her; her co-workers who are very supportive and her 68 year old son who lives in [place].  Allisson plans to visit him in the near future as she hasn't seen him in 2 years.  She reports sleeping better recently and having a good appetite.  She denies a history of depression or anxiety or any current symptoms.  Bibiana states she is typically in a positive mood although she has multiple stressors in her life with finances; single parenting for the last 9 years and her health.  She copes by focusing on one day at a time and trying to keep a positive attitude.  Her goals for this program are to get stronger and healthier.  Lawan will be followed by staff throughout the course of this program.  [place] her son in [place] who is in the [employer].  Counselor commended her for her commitment to consistently exercising.    Expected Outcomes Yazmyne will benefit from consistent exercising to achieve her stated goals.  She will also benefit from the psychoeducational components of this program to learn more positive ways of coping with stress.  Jaedin is trying to quit smoking and admits this is a stress reducer for her to smoke.  So learning new ways  to deal with stress will be helpful for her.     Continue Psychosocial Services  Follow up required by staff      Psychosocial Re-Evaluation:     Psychosocial Re-Evaluation    [place] Name 08/23/16 1703             Psychosocial Re-Evaluation   Current issues with Current Stress Concerns       Comments Nikolette said she feels Cardiac REhab has helped her stress. She is talking with another participant who is a single mother so they have alot in common.        Interventions  Encouraged to attend Cardiac Rehabilitation for the exercise          Psychosocial Discharge (Final Psychosocial Re-Evaluation):     Psychosocial Re-Evaluation - 08/23/16 1703      Psychosocial Re-Evaluation   Current issues with Current Stress Concerns   Comments Kameka said she feels Cardiac REhab has helped her stress. She is talking with another participant who is a single mother so they have alot in common.    Interventions Encouraged to attend Cardiac Rehabilitation for the exercise      Vocational Rehabilitation: Provide vocational rehab assistance to qualifying candidates.   Vocational Rehab Evaluation & Intervention:     Vocational Rehab - 08/03/16 1405      Initial Vocational Rehab Evaluation & Intervention   Assessment shows need for Vocational Rehabilitation No      Education: Education Goals: Education classes will be provided on a weekly basis, covering required topics. Participant will state understanding/return demonstration of topics presented.  Learning Barriers/Preferences:     Learning Barriers/Preferences - 08/03/16 1405      Learning Barriers/Preferences   Learning Barriers None   Learning Preferences Individual Instruction      Education Topics: General Nutrition Guidelines/Fats and Fiber: -Group instruction provided by verbal, written material, models and posters to present the general guidelines for heart healthy nutrition. Gives an explanation and review of dietary fats and fiber.   Controlling Sodium/Reading Food Labels: -Group verbal and written material supporting the discussion of sodium use in heart healthy nutrition. Review and explanation with models, verbal and written materials for utilization of the food label.   Exercise Physiology & Risk Factors: - Group verbal and written instruction with models to review the exercise physiology of the cardiovascular system and associated critical values. Details cardiovascular disease risk  factors and the goals associated with each risk factor.   Aerobic Exercise & Resistance Training: - Gives group verbal and written discussion on the health impact of inactivity. On the components of aerobic and resistive training programs and the benefits of this training and how to safely progress through these programs.   Flexibility, Balance, General Exercise Guidelines: - Provides group verbal and written instruction on the benefits of flexibility and balance training programs. Provides general exercise guidelines with specific guidelines to those with heart or lung disease. Demonstration and skill practice provided.   Stress Management: - Provides group verbal and written instruction about the health risks of elevated stress, cause of high stress, and healthy ways to reduce stress.   Cardiac Rehab from 08/28/2016 in [facility] Cardiac and Pulmonary Rehab  Date  08/16/16  Educator  [facility]  Instruction Review Code  2- meets goals/outcomes      Depression: - Provides group verbal and written instruction on the correlation between heart/lung disease and depressed mood, treatment options, and the stigmas associated with seeking treatment.   Anatomy & Physiology of  the Heart: - Group verbal and written instruction and models provide basic cardiac anatomy and physiology, with the coronary electrical and arterial systems. Review of: AMI, Angina, Valve disease, Heart Failure, Cardiac Arrhythmia, Pacemakers, and the ICD.   Cardiac Procedures: - Group verbal and written instruction and models to describe the testing methods done to diagnose heart disease. Reviews the outcomes of the test results. Describes the treatment choices: Medical Management, Angioplasty, or Coronary Bypass Surgery.   Cardiac Rehab from 08/28/2016 in [facility] Cardiac and Pulmonary Rehab  Date  08/21/16  Educator  SB  Instruction Review Code  2- meets goals/outcomes      Cardiac Medications: - Group verbal and written  instruction to review commonly prescribed medications for heart disease. Reviews the medication, class of the drug, and side effects. Includes the steps to properly store meds and maintain the prescription regimen.   Cardiac Rehab from 08/28/2016 in [facility] Cardiac and Pulmonary Rehab  Date  08/28/16 Marisue Humble 1]  Educator  SB  Instruction Review Code  2- meets goals/outcomes      Go Sex-Intimacy & Heart Disease, Get SMART - Goal Setting: - Group verbal and written instruction through game format to discuss heart disease and the return to sexual intimacy. Provides group verbal and written material to discuss and apply goal setting through the application of the S.M.A.R.T. Method.   Cardiac Rehab from 08/28/2016 in [facility] Cardiac and Pulmonary Rehab  Date  08/21/16  Educator  SB  Instruction Review Code  2- meets goals/outcomes      Other Matters of the Heart: - Provides group verbal, written materials and models to describe Heart Failure, Angina, Valve Disease, and Diabetes in the realm of heart disease. Includes description of the disease process and treatment options available to the cardiac patient.   Exercise & Equipment Safety: - Individual verbal instruction and demonstration of equipment use and safety with use of the equipment.   Cardiac Rehab from 08/28/2016 in [facility] Cardiac and Pulmonary Rehab  Date  08/03/16  Educator  [place]  Instruction Review Code  2- meets goals/outcomes      Infection Prevention: - Provides verbal and written material to individual with discussion of infection control including proper hand washing and proper equipment cleaning during exercise session.   Cardiac Rehab from 08/28/2016 in [facility] Cardiac and Pulmonary Rehab  Date  08/03/16  Educator  [place]  Instruction Review Code  2- meets goals/outcomes      Falls Prevention: - Provides verbal and written material to individual with discussion of falls prevention and safety.   Cardiac Rehab from 08/28/2016 in [facility]  Cardiac and Pulmonary Rehab  Date  08/03/16  Educator  [place]  Instruction Review Code  2- meets goals/outcomes      Diabetes: - Individual verbal and written instruction to review signs/symptoms of diabetes, desired ranges of glucose level fasting, after meals and with exercise. Advice that pre and post exercise glucose checks will be done for 3 sessions at entry of program.    Knowledge Questionnaire Score:     Knowledge Questionnaire Score - 08/03/16 1254      Knowledge Questionnaire Score   Pre Score 23/28  Correct responses reviewed with patient with verbal feedback.       Core Components/Risk Factors/Patient Goals at Admission:     Personal Goals and Risk Factors at Admission - 08/23/16 1701      Core Components/Risk Factors/Patient Goals on Admission   Number of packs per day smoking 3-5 cigarettes/day. She said  her son hands them out to her but is trying to help her cut back.    Expected Outcomes Short Term: Will demonstrate readiness to quit, by selecting a quit date.;Long Term: Complete abstinence from all tobacco products for at least 12 months from quit date.   Intervention Provide education, individualized exercise plan and daily activity instruction to help decrease symptoms of SOB with activities of daily living.   Expected Outcomes Short Term: Achieves a reduction of symptoms when performing activities of daily living.      Core Components/Risk Factors/Patient Goals Review:      Goals and Risk Factor Review    Row Name 08/23/16 1700 08/23/16 1701 08/23/16 1702         Core Components/Risk Factors/Patient Goals Review   Personal Goals Review Weight Management/Obesity;Tobacco Cessation  -  -     Review Cleta Alberts reports that she is still smoking between 3-5 cigarettes/day. She reports she quit only 2 times in her life when she was pregnant. Cleta Alberts said she is keeping a flow sheet and she and her child are checking their weight in the am and night and she is  losing weight  [place] said she is eating breakfast now also.  weight today is 231lbs and she reports her clothes feel better.  Anarosa said her household is eating better since she is trying to eat better. Her blood pressure has been good.      Expected Outcomes  -  - Heart healthy lifestyle        Core Components/Risk Factors/Patient Goals at Discharge (Final Review):      Goals and Risk Factor Review - 08/23/16 1702      Core Components/Risk Factors/Patient Goals Review   Review Bellina said her household is eating better since she is trying to eat better. Her blood pressure has been good.    Expected Outcomes Heart healthy lifestyle      ITP Comments:     ITP Comments    Row Name 08/09/16 0932 08/23/16 1659 09/06/16 0613       ITP Comments 30 day review. Continue with ITP unless directed changes per Medical Director review New to program Cleta Alberts reports that she is still smoking between 3-5 cigarettes/day. She reports she quit only 2 times in her life when she was pregnant. Cleta Alberts said she is keeping a flow sheet and she and her child are checking their weight in the am and night and she is losing weight.  30 day review. Continue with ITP unless directed changes per Medical Director review        Comments:

## 2016-09-12 ENCOUNTER — Encounter: Payer: Self-pay | Admitting: *Deleted

## 2016-09-12 DIAGNOSIS — I214 Non-ST elevation (NSTEMI) myocardial infarction: Secondary | ICD-10-CM

## 2016-09-18 ENCOUNTER — Encounter: Payer: Managed Care, Other (non HMO) | Attending: Internal Medicine | Admitting: *Deleted

## 2016-09-18 DIAGNOSIS — Z79899 Other long term (current) drug therapy: Secondary | ICD-10-CM | POA: Diagnosis not present

## 2016-09-18 DIAGNOSIS — I252 Old myocardial infarction: Secondary | ICD-10-CM | POA: Insufficient documentation

## 2016-09-18 DIAGNOSIS — I10 Essential (primary) hypertension: Secondary | ICD-10-CM | POA: Diagnosis not present

## 2016-09-18 DIAGNOSIS — F17210 Nicotine dependence, cigarettes, uncomplicated: Secondary | ICD-10-CM | POA: Diagnosis not present

## 2016-09-18 DIAGNOSIS — E785 Hyperlipidemia, unspecified: Secondary | ICD-10-CM | POA: Insufficient documentation

## 2016-09-18 DIAGNOSIS — Z7902 Long term (current) use of antithrombotics/antiplatelets: Secondary | ICD-10-CM | POA: Insufficient documentation

## 2016-09-18 DIAGNOSIS — I214 Non-ST elevation (NSTEMI) myocardial infarction: Secondary | ICD-10-CM

## 2016-09-18 DIAGNOSIS — Z7982 Long term (current) use of aspirin: Secondary | ICD-10-CM | POA: Insufficient documentation

## 2016-09-18 DIAGNOSIS — E669 Obesity, unspecified: Secondary | ICD-10-CM | POA: Diagnosis not present

## 2016-09-18 DIAGNOSIS — I2510 Atherosclerotic heart disease of native coronary artery without angina pectoris: Secondary | ICD-10-CM | POA: Insufficient documentation

## 2016-09-18 NOTE — Progress Notes (Signed)
Daily Session Note  Patient Details  Name: Deanna Moran MRN: [id number] Date of Birth: 1965/11/14 Referring Provider:     Cardiac Rehab from 08/03/2016 in [facility] Cardiac and Pulmonary Rehab  Referring Provider  End      Encounter Date: 09/18/2016  Check In:     Session Check In - 09/18/16 1629      Check-In   Location [facility]-Cardiac & Pulmonary Rehab   Staff Present Renita Papa, RN Moises Blood, BS, ACSM CEP, Exercise Physiologist;Shandy Checo Oletta Darter, [place], ACSM CEP, Exercise Physiologist   Supervising physician immediately available to respond to emergencies See telemetry face sheet for immediately available ER MD   Medication changes reported     No   Fall or balance concerns reported    No   Tobacco Cessation Use Decreased  7.5 cigarettes/day   Warm-up and Cool-down Performed on first and last piece of equipment   Resistance Training Performed Yes   VAD Patient? No     Pain Assessment   Currently in Pain? No/denies   Multiple Pain Sites No         History  Smoking Status  . Current Every Day Smoker  . Packs/day: 0.50  . Years: 38.00  . Types: Cigarettes  Smokeless Tobacco  . Never Used    Comment: states she is smoking less than before her NSTEMI. Son is limiting her cigarettes and only giving her 5-10 a day depending on how stressed she is. She would like to quit but has tried multiple methods and has either bad interactions or unsuccessful.    Goals Met:  Independence with exercise equipment Exercise tolerated well No report of cardiac concerns or symptoms Strength training completed today  Goals Unmet:  Not Applicable  Comments: Pt able to follow exercise prescription today without complaint.  Will continue to monitor for progression.    Dr. Emily Filbert is Medical Director for [place] and [facility].

## 2016-09-19 ENCOUNTER — Other Ambulatory Visit: Payer: Self-pay

## 2016-09-19 MED ORDER — CLOPIDOGREL BISULFATE 75 MG PO TABS: 75.0000 mg | ORAL_TABLET | Freq: Every day | ORAL | 2 refills | Status: DC

## 2016-09-19 MED ORDER — LOSARTAN POTASSIUM 25 MG PO TABS: 25.0000 mg | ORAL_TABLET | Freq: Every day | ORAL | 3 refills | Status: DC

## 2016-09-19 MED ORDER — NITROGLYCERIN 0.4 MG SL SUBL: 0.4000 mg | SUBLINGUAL_TABLET | SUBLINGUAL | 2 refills | Status: DC | PRN

## 2016-09-19 MED ORDER — CARVEDILOL 3.125 MG PO TABS: 3.1250 mg | ORAL_TABLET | Freq: Two times a day (BID) | ORAL | 2 refills | Status: DC

## 2016-09-19 MED ORDER — ATORVASTATIN CALCIUM 80 MG PO TABS: 80.0000 mg | ORAL_TABLET | Freq: Every day | ORAL | 3 refills | Status: DC

## 2016-09-19 MED ORDER — ASPIRIN 81 MG PO TBEC: 81.0000 mg | DELAYED_RELEASE_TABLET | Freq: Every day | ORAL | 3 refills | Status: DC

## 2016-09-19 NOTE — Telephone Encounter (Signed)
Requested Prescriptions   Signed Prescriptions Disp Refills  . aspirin 81 MG EC tablet 30 tablet 3    Sig: Take 1 tablet (81 mg total) by mouth daily.    Authorizing Provider: END, CHRISTOPHER    Ordering User: Janan Ridge carvedilol (COREG) 3.125 MG tablet 60 tablet 2    Sig: Take 1 tablet (3.125 mg total) by mouth 2 (two) times daily with a meal.    Authorizing Provider: END, CHRISTOPHER    Ordering User: Janan Ridge nitroGLYCERIN (NITROSTAT) 0.4 MG SL tablet 30 tablet 2    Sig: Place 1 tablet (0.4 mg total) under the tongue every 5 (five) minutes x 3 doses as needed for chest pain.    Authorizing Provider: END, CHRISTOPHER    Ordering User: Janan Ridge losartan (COZAAR) 25 MG tablet 90 tablet 3    Sig: Take 1 tablet (25 mg total) by mouth daily.    Authorizing Provider: END, CHRISTOPHER    Ordering User: Janan Ridge clopidogrel (PLAVIX) 75 MG tablet 30 tablet 2    Sig: Take 1 tablet (75 mg total) by mouth daily with breakfast.    Authorizing Provider: END, CHRISTOPHER    Ordering User: Janan Ridge  . atorvastatin (LIPITOR) 80 MG tablet 30 tablet 3    Sig: Take 1 tablet (80 mg total) by mouth daily at 6 PM.    Authorizing Provider: END, CHRISTOPHER    Ordering User: Janan Ridge

## 2016-09-21 DIAGNOSIS — I214 Non-ST elevation (NSTEMI) myocardial infarction: Secondary | ICD-10-CM

## 2016-09-21 DIAGNOSIS — I252 Old myocardial infarction: Secondary | ICD-10-CM | POA: Diagnosis not present

## 2016-09-21 NOTE — Progress Notes (Signed)
Daily Session Note  Patient Details  Name: Deanna Moran MRN: [id number] Date of Birth: 02-07-1966 Referring Provider:     Cardiac Rehab from 08/03/2016 in [facility] Cardiac and Pulmonary Rehab  Referring Provider  End      Encounter Date: 09/21/2016  Check In:     Session Check In - 09/21/16 1718      Check-In   Location [facility]Cardiac & Pulmonary Rehab   Staff Present Gerlene Burdock, RN, Moises Blood, BS, ACSM CEP, Exercise Physiologist;Akanksha Bellmore Flavia Shipper   Supervising physician immediately available to respond to emergencies See telemetry face sheet for immediately available ER MD   Medication changes reported     No   Fall or balance concerns reported    No   Tobacco Cessation No Change  patient still uses less than a half pack a day   Warm-up and Cool-down Performed on first and last piece of equipment   Resistance Training Performed Yes   VAD Patient? No     Pain Assessment   Currently in Pain? No/denies   Multiple Pain Sites No         History  Smoking Status  . Current Every Day Smoker  . Packs/day: 0.50  . Years: 38.00  . Types: Cigarettes  Smokeless Tobacco  . Never Used    Comment: states she is smoking less than before her NSTEMI. Son is limiting her cigarettes and only giving her 5-10 a day depending on how stressed she is. She would like to quit but has tried multiple methods and has either bad interactions or unsuccessful.    Goals Met:  Independence with exercise equipment Exercise tolerated well No report of cardiac concerns or symptoms Strength training completed today  Goals Unmet:  Not Applicable  Comments: Pt able to follow exercise prescription today without complaint.  Will continue to monitor for progression.   Dr. Emily Filbert is Medical Director for [place] and [facility].

## 2016-09-25 ENCOUNTER — Encounter: Payer: Managed Care, Other (non HMO) | Admitting: *Deleted

## 2016-09-25 DIAGNOSIS — I214 Non-ST elevation (NSTEMI) myocardial infarction: Secondary | ICD-10-CM

## 2016-09-25 DIAGNOSIS — I252 Old myocardial infarction: Secondary | ICD-10-CM | POA: Diagnosis not present

## 2016-09-25 NOTE — Progress Notes (Signed)
Daily Session Note  Patient Details  Name: Deanna Moran MRN: [id number] Date of Birth: 05-20-1965 Referring Provider:     Cardiac Rehab from 08/03/2016 in [facility] Cardiac and Pulmonary Rehab  Referring Provider  End      Encounter Date: 09/25/2016  Check In:     Session Check In - 09/25/16 1705      Check-In   Location [facility]Card[facility] Re[facility]   Staff Present Nyoka Cowden, RN, BSN, MA;Alyas Creary Sherryll Burger, RN Moises Blood, BS, ACSM CEP, Exercise Physiologist;Joseph Flavia Shipper   Supervising physician immediately available to respond to emergencies See telemetry face sheet for immediately available ER MD   Medication changes reported     No   Fall or balance concerns reported    No   Tobacco Cessation No Change  1/2 pack qd   Warm-up and Cool-down Performed on first and last piece of equipment   Resistance Training Performed Yes   VAD Patient? No     Pain Assessment   Currently in Pain? No/denies         History  Smoking Status  . Current Every Day Smoker  . Packs/day: 0.50  . Years: 38.00  . Types: Cigarettes  Smokeless Tobacco  . Never Used    Comment: states she is smoking less than before her NSTEMI. Son is limiting her cigarettes and only giving her 5-10 a day depending on how stressed she is. She would like to quit but has tried multiple methods and has either bad interactions or unsuccessful.    Goals Met:  Proper associated with RPD/PD & O2 Sat Independence with exercise equipment Exercise tolerated well No report of cardiac concerns or symptoms Strength training completed today  Goals Unmet:  Not Applicable  Comments: Pt able to follow exercise prescription today without complaint.  Will continue to monitor for progression.    Dr. Emily Filbert is Medical Director for [place] and [facility].

## 2016-09-27 ENCOUNTER — Encounter: Payer: Managed Care, Other (non HMO) | Admitting: *Deleted

## 2016-09-27 DIAGNOSIS — I214 Non-ST elevation (NSTEMI) myocardial infarction: Secondary | ICD-10-CM

## 2016-09-27 DIAGNOSIS — I252 Old myocardial infarction: Secondary | ICD-10-CM | POA: Diagnosis not present

## 2016-09-27 NOTE — Progress Notes (Signed)
Daily Session Note  Patient Details  Name: Hartlyn Reigel MRN: [id number] Date of Birth: 1966/01/26 Referring Provider:     Cardiac Rehab from 08/03/2016 in [facility] Cardiac and Pulmonary Rehab  Referring Provider  End      Encounter Date: 09/27/2016  Check In:     Session Check In - 09/27/16 1704      Check-In   Location [facility]   Staff Present Nyoka Cowden, RN, BSN, MA;Meredith Sherryll Burger, RN Vickki Hearing, BA, ACSM CEP, Exercise Physiologist   Supervising physician immediately available to respond to emergencies See telemetry face sheet for immediately available ER MD   Medication changes reported     No   Fall or balance concerns reported    No   Tobacco Cessation No Change   Warm-up and Cool-down Performed on first and last piece of equipment   Resistance Training Performed Yes   VAD Patient? No     Pain Assessment   Currently in Pain? No/denies         History  Smoking Status  . Current Every Day Smoker  . Packs/day: 0.50  . Years: 38.00  . Types: Cigarettes  Smokeless Tobacco  . Never Used    Comment: states she is smoking less than before her NSTEMI. Son is limiting her cigarettes and only giving her 5-10 a day depending on how stressed she is. She would like to quit but has tried multiple methods and has either bad interactions or unsuccessful.    Goals Met:  Proper associated with RPD/PD & O2 Sat Independence with exercise equipment Exercise tolerated well No report of cardiac concerns or symptoms Strength training completed today  Goals Unmet:  Not Applicable  Comments: Pt able to follow exercise prescription today without complaint.  Will continue to monitor for progression.    Dr. Emily Filbert is Medical Director for [place] and [facility].

## 2016-09-28 DIAGNOSIS — I214 Non-ST elevation (NSTEMI) myocardial infarction: Secondary | ICD-10-CM

## 2016-09-28 DIAGNOSIS — I252 Old myocardial infarction: Secondary | ICD-10-CM | POA: Diagnosis not present

## 2016-09-28 NOTE — Progress Notes (Signed)
Daily Session Note  Patient Details  Name: Deanna Moran MRN: [id number] Date of Birth: 07/15/1965 Referring Provider:     Cardiac Rehab from 08/03/2016 in [facility] Card[facility] Rehab  Referring Provider  End      Encounter Date: 09/28/2016  Check In:     Session Check In - 09/28/16 1625      Check-In   Location [facility] Pul[facility] Re[facility]   Staff Present Nyoka Cowden, RN, BSN, [place], BS, ACSM CEP, Exercise Physiologist;Merrisa Skorupski Flavia Shipper   Supervising physician immediately available to respond to emergencies See telemetry face sheet for immediately available ER MD   Medication changes reported     No   Fall or balance concerns reported    No   Tobacco Cessation No Change   Warm-up and Cool-down Performed on first and last piece of equipment   Resistance Training Performed Yes   VAD Patient? No     Pain Assessment   Currently in Pain? No/denies   Multiple Pain Sites No         History  Smoking Status  . Current Every Day Smoker  . Packs/day: 0.50  . Years: 38.00  . Types: Cigarettes  Smokeless Tobacco  . Never Used    Comment: states she is smoking less than before her NSTEMI. Son is limiting her cigarettes and only giving her 5-10 a day depending on how stressed she is. She would like to quit but has tried multiple methods and has either bad interactions or unsuccessful.    Goals Met:  Independence with exercise equipment Exercise tolerated well No report of cardiac concerns or symptoms Strength training completed today  Goals Unmet:  Not Applicable  Comments: Pt able to follow exercise prescription today without complaint.  Will continue to monitor for progression.   Dr. Emily Filbert is Medical Director for [place] and [facility].

## 2016-10-02 ENCOUNTER — Encounter: Payer: Managed Care, Other (non HMO) | Admitting: *Deleted

## 2016-10-02 DIAGNOSIS — I252 Old myocardial infarction: Secondary | ICD-10-CM | POA: Diagnosis not present

## 2016-10-02 DIAGNOSIS — I214 Non-ST elevation (NSTEMI) myocardial infarction: Secondary | ICD-10-CM

## 2016-10-02 NOTE — Progress Notes (Signed)
Daily Session Note  Patient Details  Name: Shavell Nored MRN: [id number] Date of Birth: 15-Apr-1965 Referring Provider:     Cardiac Rehab from 08/03/2016 in [facility] Rehab  Referring Provider  End      Encounter Date: 10/02/2016  Check In:     Session Check In - 10/02/16 1734      Check-In   Location [facility] Pul[facility] Re[facility]   Staff Present Nada Maclachlan, BA, ACSM CEP, Exercise Physiologist;Eswin Worrell Amedeo Plenty, BS, ACSM CEP, Exercise Physiologist;Meredith Sherryll Burger, RN BSN   Supervising physician immediately available to respond to emergencies See telemetry face sheet for immediately available ER MD   Medication changes reported     No   Fall or balance concerns reported    No   Warm-up and Cool-down Performed on first and last piece of equipment   Resistance Training Performed Yes   VAD Patient? No     Pain Assessment   Currently in Pain? No/denies   Multiple Pain Sites No         History  Smoking Status  . Current Every Day Smoker  . Packs/day: 0.50  . Years: 38.00  . Types: Cigarettes  Smokeless Tobacco  . Never Used    Comment: states she is smoking less than before her NSTEMI. Son is limiting her cigarettes and only giving her 5-10 a day depending on how stressed she is. She would like to quit but has tried multiple methods and has either bad interactions or unsuccessful.    Goals Met:  Independence with exercise equipment Exercise tolerated well No report of cardiac concerns or symptoms Strength training completed today  Goals Unmet:  Not Applicable  Comments: Pt able to follow exercise prescription today without complaint.  Will continue to monitor for progression.    Dr. Emily Filbert is Medical Director for [place] and [facility].

## 2016-10-04 ENCOUNTER — Encounter: Payer: Self-pay | Admitting: *Deleted

## 2016-10-04 ENCOUNTER — Encounter: Payer: Managed Care, Other (non HMO) | Admitting: *Deleted

## 2016-10-04 DIAGNOSIS — I214 Non-ST elevation (NSTEMI) myocardial infarction: Secondary | ICD-10-CM

## 2016-10-04 DIAGNOSIS — I252 Old myocardial infarction: Secondary | ICD-10-CM | POA: Diagnosis not present

## 2016-10-04 NOTE — Progress Notes (Signed)
Daily Session Note  Patient Details  Name: Deanna Moran MRN: [id number] Date of Birth: Jan 30, 1966 Referring Provider:     Cardiac Rehab from 08/03/2016 in [facility] Cardiac and Pulmonary Rehab  Referring Provider  End      Encounter Date: 10/04/2016  Check In:     Session Check In - 10/04/16 1616      Check-In   Location [facility]-Cardiac & Pulmonary Rehab   Staff Present Nyoka Cowden, RN, BSN, MA;Regan Llorente Sherryll Burger, RN Vickki Hearing, BA, ACSM CEP, Exercise Physiologist   Supervising physician immediately available to respond to emergencies See telemetry face sheet for immediately available ER MD   Medication changes reported     No   Fall or balance concerns reported    No   Warm-up and Cool-down Performed on first and last piece of equipment   Resistance Training Performed Yes   VAD Patient? No     Pain Assessment   Currently in Pain? No/denies         History  Smoking Status  . Current Every Day Smoker  . Packs/day: 0.50  . Years: 38.00  . Types: Cigarettes  Smokeless Tobacco  . Never Used    Comment: states she is smoking less than before her NSTEMI. Son is limiting her cigarettes and only giving her 5-10 a day depending on how stressed she is. She would like to quit but has tried multiple methods and has either bad interactions or unsuccessful.    Goals Met:  Proper associated with RPD/PD & O2 Sat Independence with exercise equipment Exercise tolerated well No report of cardiac concerns or symptoms Strength training completed today  Goals Unmet:  Not Applicable  Comments: Pt able to follow exercise prescription today without complaint.  Will continue to monitor for progression.    Dr. Emily Filbert is Medical Director for [place] and [facility].

## 2016-10-04 NOTE — Progress Notes (Signed)
Cardiac Individual Treatment Plan  Patient Details  Name: Deanna Moran MRN: [id number] Date of Birth: 08-Oct-1965 Referring Provider:     Cardiac Rehab from 08/03/2016 in [facility] Cardiac and Pulmonary Rehab  Referring Provider  End      Initial Encounter Date:    Cardiac Rehab from 08/03/2016 in [facility] Cardiac and Pulmonary Rehab  Date  08/03/16  Referring Provider  End      Visit Diagnosis: NSTEMI (non-ST elevated myocardial infarction) [facility])  Patient's Home Medications on Admission:  Current Outpatient Prescriptions:  .  aspirin 81 MG EC tablet, Take 1 tablet (81 mg total) by mouth daily., Disp: 30 tablet, Rfl: 3 .  atorvastatin (LIPITOR) 80 MG tablet, Take 1 tablet (80 mg total) by mouth daily at 6 PM., Disp: 30 tablet, Rfl: 3 .  carvedilol (COREG) 3.125 MG tablet, Take 1 tablet (3.125 mg total) by mouth 2 (two) times daily with a meal., Disp: 60 tablet, Rfl: 2 .  clopidogrel (PLAVIX) 75 MG tablet, Take 1 tablet (75 mg total) by mouth daily with breakfast., Disp: 30 tablet, Rfl: 2 .  losartan (COZAAR) 25 MG tablet, Take 1 tablet (25 mg total) by mouth daily., Disp: 90 tablet, Rfl: 3 .  naproxen sodium (ANAPROX) 220 MG tablet, Take 220 mg by mouth as needed., Disp: , Rfl:  .  nitroGLYCERIN (NITROSTAT) 0.4 MG SL tablet, Place 1 tablet (0.4 mg total) under the tongue every 5 (five) minutes x 3 doses as needed for chest pain., Disp: 30 tablet, Rfl: 2 .  prednisoLONE acetate (PRED FORTE) 1 % ophthalmic suspension, Place 2 drops into both eyes 4 (four) times daily., Disp: , Rfl:   Past Medical History: Past Medical History:  Diagnosis Date  . Chronic back pain   . Coronary artery disease   . HLD (hyperlipidemia)   . HTN (hypertension)   . NSTEMI (non-ST elevated myocardial infarction) ([place]) 06/2016  . Obesity     Tobacco Use: History  Smoking Status  . Current Every Day Smoker  . Packs/day: 0.50  . Years: 38.00  . Types: Cigarettes  Smokeless Tobacco  . Never Used     Comment: states she is smoking less than before her NSTEMI. Son is limiting her cigarettes and only giving her 5-10 a day depending on how stressed she is. She would like to quit but has tried multiple methods and has either bad interactions or unsuccessful.    Labs: Recent Review Flowsheet Data    Labs for ITP Cardiac and Pulmonary Rehab Latest Ref Rng & Units 07/10/2016 07/11/2016 08/30/2016   Cholestrol 100 - 199 mg/dL - 144 120   LDLCALC 0 - 99 mg/dL - 103(H) 74   LDLDIRECT 0 - 99 mg/dL - - 76   HDL >39 mg/dL - 31(L) 32(L)   Trlycerides 0 - 149 mg/dL - 52 68   Hemoglobin A1c 4.8 - 5.6 % 5.7(H) - -       Exercise Target Goals:    Exercise Program Goal: Individual exercise prescription set with THRR, safety & activity barriers. Participant demonstrates ability to understand and report RPE using BORG scale, to self-measure pulse accurately, and to acknowledge the importance of the exercise prescription.  Exercise Prescription Goal: Starting with aerobic activity 30 plus minutes a day, 3 days per week for initial exercise prescription. Provide home exercise prescription and guidelines that participant acknowledges understanding prior to discharge.  Activity Barriers & Risk Stratification:     Activity Barriers & Cardiac Risk Stratification - 08/03/16 1355  Activity Barriers & Cardiac Risk Stratification   Activity Barriers Joint Problems;Back Problems  She has scoliosis in her back causing 24/7 pain in her back that she has learned to deal with. She also has pain in her right hip but is not a surgical candidate at this time.    Cardiac Risk Stratification High      6 Minute Walk:     6 Minute Walk    Row Name 08/03/16 1441         6 Minute Walk   Distance 1186 feet     Walk Time 6 minutes     # of Rest Breaks 0     MPH 2.24     METS 3.66     RPE 13     VO2 Peak 12.8     Symptoms Yes (comment)     Comments back pain 5/10     Resting HR 65 bpm     Resting BP  148/78     Max Ex. HR 108 bpm     Max Ex. BP 164/80     2 Minute Post BP 128/68        Oxygen Initial Assessment:   Oxygen Re-Evaluation:   Oxygen Discharge (Final Oxygen Re-Evaluation):   Initial Exercise Prescription:     Initial Exercise Prescription - 08/03/16 1400      Date of Initial Exercise RX and Referring Provider   Date 08/03/16   Referring Provider End     Treadmill   MPH 2.3   Grade 1   Minutes 15   METs 3.08     Recumbant Bike   Level 2   RPM 60   Watts 40   Minutes 15   METs 3.14     NuStep   Level 3   SPM 80   Minutes 15   METs 3     REL-XR   Level 3   Speed 50   Minutes 15   METs 3     Prescription Details   Frequency (times per week) 3   Duration Progress to 30 minutes of continuous aerobic without signs/symptoms of physical distress     Intensity   THRR 40-80% of Max Heartrate 106-147   Ratings of Perceived Exertion 11-13     Resistance Training   Training Prescription Yes   Weight 3   Reps 10-15      Perform Capillary Blood Glucose checks as needed.  Exercise Prescription Changes:     Exercise Prescription Changes    Row Name 08/03/16 1300 08/17/16 1100 08/21/16 1600 08/31/16 1400 09/29/16 1200     Response to Exercise   Blood Pressure (Admit) 148/78 126/68  - 110/64 112/60   Blood Pressure (Exercise) 164/80 122/68  - 126/68 150/60   Blood Pressure (Exit) 128/68 138/70  - 102/64 120/62   Heart Rate (Admit) 76 bpm 71 bpm  - 48 bpm 83 bpm   Heart Rate (Exercise) 108 bpm 98 bpm  - 95 bpm 106 bpm   Heart Rate (Exit) 68 bpm 68 bpm  - 60 bpm 80 bpm   Rating of Perceived Exertion (Exercise) 13 12  - 12 11.5   Symptoms  -  -  -  - one   Duration  -  -  -  - Continue with 45 min of aerobic exercise without signs/symptoms of physical distress.   Intensity  -  -  -  - THRR unchanged     Progression   Progression  -  -  -  -  Continue to progress workloads to maintain intensity without signs/symptoms of physical distress.    Average METs  -  -  -  - 3.4     Resistance Training   Training Prescription  - Yes Yes Yes Yes   Weight  - 2 2 3 3  lb   Reps  - 10-15 10-15 10-15 10-15     Interval Training   Interval Training  -  -  -  - No     Treadmill   MPH  -  -  - 2.3  -   Grade  -  -  - 0  -   Minutes  -  -  - 15  -   METs  -  -  - 2.76  -     NuStep   Level  - 3 3 3 3    SPM  - 80 80 80 80   Minutes  - 15 15 15 15    METs  - 2.4 2.4 3 3.4     REL-XR   Level  - 3 3  - 3   Speed  - 50 50  -  -   Minutes  - 15 15  - 15   METs  - 2.5 2.5  -  -     Home Exercise Plan   Plans to continue exercise at  -  - Home (comment)  -  -   Frequency  -  - Add 2 additional days to program exercise sessions.  -  -   Initial Home Exercises Provided  -  - 08/21/16  -  -      Exercise Comments:     Exercise Comments    Row Name 08/16/16 1818 08/21/16 1655 08/31/16 1501       Exercise Comments First full day of exercise!  Patient was oriented to gym and equipment including functions, settings, policies, and procedures.  Patient's individual exercise prescription and treatment plan were reviewed.  All starting workloads were established based on the results of the 6 minute walk test done at initial orientation visit.  The plan for exercise progression was also introduced and progression will be customized based on patient's performance and goals. Reviewed home exercise with pt today.  Pt plans to 2 for exercise.  Reviewed THR, pulse, RPE, sign and symptoms, NTG use, and when to call 911 or MD.  Also discussed weather considerations and indoor options.  Pt voiced understanding. Reviewed these guidlines so soon due to patient leaving the country to travel.  Deanna Moran will be on vacation the next couple weeks.  She has tolerated exercise very well in her first sessions.        Exercise Goals and Review:     Exercise Goals    Row Name 08/03/16 1441             Exercise Goals   Increase Physical Activity Yes        Intervention Provide advice, education, support and counseling about physical activity/exercise needs.;Develop an individualized exercise prescription for aerobic and resistive training based on initial evaluation findings, risk stratification, comorbidities and participant's personal goals.       Expected Outcomes Achievement of increased cardiorespiratory fitness and enhanced flexibility, muscular endurance and strength shown through measurements of functional capacity and personal statement of participant.       Increase Strength and Stamina Yes       Intervention Provide advice, education, support and counseling about physical activity/exercise needs.;Develop an  individualized exercise prescription for aerobic and resistive training based on initial evaluation findings, risk stratification, comorbidities and participant's personal goals.       Expected Outcomes Achievement of increased cardiorespiratory fitness and enhanced flexibility, muscular endurance and strength shown through measurements of functional capacity and personal statement of participant.          Exercise Goals Re-Evaluation :     Exercise Goals Re-Evaluation    Row Name 08/21/16 1651 09/12/16 1503 09/29/16 1255         Exercise Goal Re-Evaluation   Exercise Goals Review Increase Physical Activity  - Increase Physical Activity;Increase Strenth and Stamina     Comments Reviewed home exercise guidelines with patient and recommended adding 2 extra days of exercise at home outside of class.  Out of town for vacation since last review. Deanna Moran is progressing well with exercise and has good family support.  SHe plans to go to [employer].     Expected Outcomes Patient will use these guideline to exercise safely at home.   - Short - Deanna Moran will continue to improve overall fitness.  Long - Deanna Moran will complete the HT program.        Discharge Exercise Prescription (Final Exercise Prescription Changes):     Exercise  Prescription Changes - 09/29/16 1200      Response to Exercise   Blood Pressure (Admit) 112/60   Blood Pressure (Exercise) 150/60   Blood Pressure (Exit) 120/62   Heart Rate (Admit) 83 bpm   Heart Rate (Exercise) 106 bpm   Heart Rate (Exit) 80 bpm   Rating of Perceived Exertion (Exercise) 11.5   Symptoms one   Duration Continue with 45 min of aerobic exercise without signs/symptoms of physical distress.   Intensity THRR unchanged     Progression   Progression Continue to progress workloads to maintain intensity without signs/symptoms of physical distress.   Average METs 3.4     Resistance Training   Training Prescription Yes   Weight 3 lb   Reps 10-15     Interval Training   Interval Training No     NuStep   Level 3   SPM 80   Minutes 15   METs 3.4     REL-XR   Level 3   Minutes 15      Nutrition:  Target Goals: Understanding of nutrition guidelines, daily intake of sodium <1550m, cholesterol <2035m calories 30% from fat and 7% or less from saturated fats, daily to have 5 or more servings of fruits and vegetables.  Biometrics:     Pre Biometrics - 08/03/16 1441      Pre Biometrics   Height 5' 10.5" (1.791 m)   Weight 240 lb 9.6 oz (109.1 kg)   Waist Circumference 44 inches   Hip Circumference 47.5 inches   Waist to Hip Ratio 0.93 %   BMI (Calculated) 34.1   Single Leg Stand 26.57 seconds       Nutrition Therapy Plan and Nutrition Goals:     Nutrition Therapy & Goals - 08/03/16 1359      Intervention Plan   Intervention Prescribe, educate and counsel regarding individualized specific dietary modifications aiming towards targeted core components such as weight, hypertension, lipid management, diabetes, heart failure and other comorbidities.;Nutrition handout(s) given to patient.   Expected Outcomes Short Term Goal: Understand basic principles of dietary content, such as calories, fat, sodium, cholesterol and nutrients.;Short Term Goal: A plan has been  developed with personal nutrition goals set during dietitian appointment.;Long  Term Goal: Adherence to prescribed nutrition plan.      Nutrition Discharge: Rate Your Plate Scores:     Nutrition Assessments - 08/03/16 1254      MEDFICTS Scores   Pre Score 29      Nutrition Goals Re-Evaluation:   Nutrition Goals Discharge (Final Nutrition Goals Re-Evaluation):   Psychosocial: Target Goals: Acknowledge presence or absence of significant depression and/or stress, maximize coping skills, provide positive support system. Participant is able to verbalize types and ability to use techniques and skills needed for reducing stress and depression.   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 She gets very short of breath with activity and would like to be able to do more things around the house without taking breaks. She also states she has some mild anxiety and times of feeling sorry for herself and getting emotional but her son helps her get through them fairly quickly.      Family Dynamics   Good Support System? Yes   Comments Her 39 year old son is her main support as well as some co-workers. Her older son is overseas in [place] where she will be visiting this summer. Her ex-husband is her emergency contact.      Barriers   Psychosocial barriers to participate in program The patient should benefit from training in stress management and relaxation.     Screening Interventions   Interventions Program counselor consult;Encouraged to exercise;To provide support and resources with identified psychosocial needs;Provide feedback about the scores to  participant      Quality of Life Scores:      Quality of Life - 08/03/16 1252      Quality of Life Scores   Health/Function Pre 21.14 %   Socioeconomic Pre 20.67 %   Psych/Spiritual Pre 22.67 %   Family Pre 28.5 %   GLOBAL Pre 22.33 %      PHQ-9: Recent Review Flowsheet Data    Depression screen [facility] 2/9 08/03/2016   Decreased Interest 0   Down, Depressed, Hopeless 1   PHQ - 2 Score 1   Altered sleeping 0   Tired, decreased energy 1   Change in appetite 0   Feeling bad or failure about yourself  1   Trouble concentrating 0   Moving slowly or fidgety/restless 0   Suicidal thoughts 0   PHQ-9 Score 3   Difficult doing work/chores Somewhat difficult     Interpretation of Total Score  Total Score Depression Severity:  1-4 = Minimal depression, 5-9 = Mild depression, 10-14 = Moderate depression, 15-19 = Moderately severe depression, 20-27 = Severe depression   Psychosocial Evaluation and Intervention:     Psychosocial Evaluation - 08/23/16 1707      Psychosocial Evaluation & Interventions   Interventions Encouraged to exercise with the program and follow exercise prescription;Stress management education;Relaxation education   Comments Counselor met with Deanna Moran today for intial psychosocial evaluation.  She is a 51 year old who had a heart attack in April.  Deanna Moran has a strong support system with her 31 year old son living at home with her; her co-workers who are very supportive and  her 52 year old son who lives in [place].  Jaselynn plans to visit him in the near future as she hasn't seen him in 2 years.  She reports sleeping better recently and having a good appetite.  She denies a history of depression or anxiety or any current symptoms.  Deanna Moran states she is typically in a positive mood although she has multiple stressors in her life with finances; single parenting for the last 9 years and her health.  She copes by focusing on one day at a time and trying to keep a positive  attitude.  Her goals for this program are to get stronger and healthier.  Deanna Moran will be followed by staff throughout the course of this program.  [place] son in [place] who is in the [employer].  Counselor commended her for her commitment to consistently exercising.    Expected Outcomes Deanna Moran will benefit from consistent exercising to achieve her stated goals.  She will also benefit from the psychoeducational components of this program to learn more positive ways of coping with stress.  Deanna Moran is trying to quit smoking and admits this is a stress reducer for her to smoke.  So learning new ways to deal with stress will be helpful for her.     Continue Psychosocial Services  Follow up required by staff      Psychosocial Re-Evaluation:     Psychosocial Re-Evaluation    [place] Name 08/23/16 1703             Psychosocial Re-Evaluation   Current issues with Current Stress Concerns       Comments Deanna Moran said she feels Cardiac REhab has helped her stress. She is talking with another participant who is a single mother so they have alot in common.        Interventions Encouraged to attend Cardiac Rehabilitation for the exercise          Psychosocial Discharge (Final Psychosocial Re-Evaluation):     Psychosocial Re-Evaluation - 08/23/16 1703      Psychosocial Re-Evaluation   Current issues with Current Stress Concerns   Comments Deanna Moran said she feels Cardiac REhab has helped her stress. She is talking with another participant who is a single mother so they have alot in common.    Interventions Encouraged to attend Cardiac Rehabilitation for the exercise      Vocational Rehabilitation: Provide vocational rehab assistance to qualifying candidates.   Vocational Rehab Evaluation & Intervention:     Vocational Rehab - 08/03/16 1405      Initial Vocational Rehab Evaluation & Intervention   Assessment shows need for Vocational  Rehabilitation No      Education: Education Goals: Education classes will be provided on a weekly basis, covering required topics. Participant will state understanding/return demonstration of topics presented.  Learning Barriers/Preferences:     Learning Barriers/Preferences - 08/03/16 1405      Learning Barriers/Preferences   Learning Barriers None   Learning Preferences Individual Instruction      Education Topics: General Nutrition Guidelines/Fats and Fiber: -Group instruction provided by verbal, written material, models and posters to present the general guidelines for heart healthy nutrition. Gives an explanation and review of dietary fats and fiber.   Controlling Sodium/Reading Food Labels: -Group verbal and written material supporting the discussion of sodium use in heart healthy nutrition. Review and explanation with models, verbal and written materials for utilization of the food label.   Cardiac Rehab  from 10/02/2016 in [facility] Cardiac and Pulmonary Rehab  Date  09/18/16  Educator  PI  Instruction Review Code  2- meets goals/outcomes      Exercise Physiology & Risk Factors: - Group verbal and written instruction with models to review the exercise physiology of the cardiovascular system and associated critical values. Details cardiovascular disease risk factors and the goals associated with each risk factor.   Cardiac Rehab from 10/02/2016 in [facility] Cardiac and Pulmonary Rehab  Date  09/25/16  Educator  [facility]  Instruction Review Code  2- meets goals/outcomes      Aerobic Exercise & Resistance Training: - Gives group verbal and written discussion on the health impact of inactivity. On the components of aerobic and resistive training programs and the benefits of this training and how to safely progress through these programs.   Cardiac Rehab from 10/02/2016 in [facility] Cardiac and Pulmonary Rehab  Date  09/27/16  Educator  AS  Instruction Review Code  2- meets goals/outcomes       Flexibility, Balance, General Exercise Guidelines: - Provides group verbal and written instruction on the benefits of flexibility and balance training programs. Provides general exercise guidelines with specific guidelines to those with heart or lung disease. Demonstration and skill practice provided.   Cardiac Rehab from 10/02/2016 in [facility] Cardiac and Pulmonary Rehab  Date  10/02/16  Educator  AS  Instruction Review Code  2- meets goals/outcomes      Stress Management: - Provides group verbal and written instruction about the health risks of elevated stress, cause of high stress, and healthy ways to reduce stress.   Cardiac Rehab from 10/02/2016 in [facility] Cardiac and Pulmonary Rehab  Date  08/16/16  Educator  [facility]  Instruction Review Code  2- meets goals/outcomes      Depression: - Provides group verbal and written instruction on the correlation between heart/lung disease and depressed mood, treatment options, and the stigmas associated with seeking treatment.   Anatomy & Physiology of the Heart: - Group verbal and written instruction and models provide basic cardiac anatomy and physiology, with the coronary electrical and arterial systems. Review of: AMI, Angina, Valve disease, Heart Failure, Cardiac Arrhythmia, Pacemakers, and the ICD.   Cardiac Procedures: - Group verbal and written instruction and models to describe the testing methods done to diagnose heart disease. Reviews the outcomes of the test results. Describes the treatment choices: Medical Management, Angioplasty, or Coronary Bypass Surgery.   Cardiac Rehab from 10/02/2016 in [facility] Cardiac and Pulmonary Rehab  Date  08/21/16  Educator  SB  Instruction Review Code  2- meets goals/outcomes      Cardiac Medications: - Group verbal and written instruction to review commonly prescribed medications for heart disease. Reviews the medication, class of the drug, and side effects. Includes the steps to properly store meds and  maintain the prescription regimen.   Cardiac Rehab from 10/02/2016 in [facility] Cardiac and Pulmonary Rehab  Date  08/28/16 Marisue Humble 1]  Educator  SB  Instruction Review Code  2- meets goals/outcomes      Go Sex-Intimacy & Heart Disease, Get SMART - Goal Setting: - Group verbal and written instruction through game format to discuss heart disease and the return to sexual intimacy. Provides group verbal and written material to discuss and apply goal setting through the application of the S.M.A.R.T. Method.   Cardiac Rehab from 10/02/2016 in [facility] Cardiac and Pulmonary Rehab  Date  08/21/16  Educator  SB  Instruction Review Code  2- meets goals/outcomes  Other Matters of the Heart: - Provides group verbal, written materials and models to describe Heart Failure, Angina, Valve Disease, and Diabetes in the realm of heart disease. Includes description of the disease process and treatment options available to the cardiac patient.   Exercise & Equipment Safety: - Individual verbal instruction and demonstration of equipment use and safety with use of the equipment.   Cardiac Rehab from 10/02/2016 in [facility] Cardiac and Pulmonary Rehab  Date  08/03/16  Educator  KS  Instruction Review Code  2- meets goals/outcomes      Infection Prevention: - Provides verbal and written material to individual with discussion of infection control including proper hand washing and proper equipment cleaning during exercise session.   Cardiac Rehab from 10/02/2016 in [facility] Cardiac and Pulmonary Rehab  Date  08/03/16  Educator  KS  Instruction Review Code  2- meets goals/outcomes      Falls Prevention: - Provides verbal and written material to individual with discussion of falls prevention and safety.   Cardiac Rehab from 10/02/2016 in [facility] Cardiac and Pulmonary Rehab  Date  08/03/16  Educator  KS  Instruction Review Code  2- meets goals/outcomes      Diabetes: - Individual verbal and written instruction to review  signs/symptoms of diabetes, desired ranges of glucose level fasting, after meals and with exercise. Advice that pre and post exercise glucose checks will be done for 3 sessions at entry of program.    Knowledge Questionnaire Score:     Knowledge Questionnaire Score - 08/03/16 1254      Knowledge Questionnaire Score   Pre Score 23/28  Correct responses reviewed with patient with verbal feedback.       Core Components/Risk Factors/Patient Goals at Admission:     Personal Goals and Risk Factors at Admission - 08/23/16 1701      Core Components/Risk Factors/Patient Goals on Admission   Number of packs per day smoking 3-5 cigarettes/day. She said her son hands them out to her but is trying to help her cut back.    Expected Outcomes Short Term: Will demonstrate readiness to quit, by selecting a quit date.;Long Term: Complete abstinence from all tobacco products for at least 12 months from quit date.   Intervention Provide education, individualized exercise plan and daily activity instruction to help decrease symptoms of SOB with activities of daily living.   Expected Outcomes Short Term: Achieves a reduction of symptoms when performing activities of daily living.      Core Components/Risk Factors/Patient Goals Review:      Goals and Risk Factor Review    Row Name 08/23/16 1700 08/23/16 1701 08/23/16 1702         Core Components/Risk Factors/Patient Goals Review   Personal Goals Review Weight Management/Obesity;Tobacco Cessation  -  -     Review Deanna Moran reports that she is still smoking between 3-5 cigarettes/day. She reports she quit only 2 times in her life when she was pregnant. Deanna Moran said she is keeping a flow sheet and she and her child are checking their weight in the am and night and she is losing weight  [place] said she is eating breakfast now also.  weight today is 231lbs and she reports her clothes feel better.  Deanna Moran said her household is eating better since she is  trying to eat better. Her blood pressure has been good.      Expected Outcomes  -  - Heart healthy lifestyle        Core Components/Risk  Factors/Patient Goals at Discharge (Final Review):      Goals and Risk Factor Review - 08/23/16 1702      Core Components/Risk Factors/Patient Goals Review   Review Deanna Moran said her household is eating better since she is trying to eat better. Her blood pressure has been good.    Expected Outcomes Heart healthy lifestyle      ITP Comments:     ITP Comments    Row Name 08/09/16 0932 08/23/16 1659 09/06/16 0613 09/12/16 1502 10/04/16 0701   ITP Comments 30 day review. Continue with ITP unless directed changes per Medical Director review New to program Deanna Moran reports that she is still smoking between 3-5 cigarettes/day. She reports she quit only 2 times in her life when she was pregnant. Deanna Moran said she is keeping a flow sheet and she and her child are checking their weight in the am and night and she is losing weight.  30 day review. Continue with ITP unless directed changes per Medical Director review Out of town for vacation since last review. 30 day review. Continue with ITP unless directed changes per Medical Director review         Comments:

## 2016-10-05 DIAGNOSIS — I214 Non-ST elevation (NSTEMI) myocardial infarction: Secondary | ICD-10-CM

## 2016-10-05 DIAGNOSIS — I252 Old myocardial infarction: Secondary | ICD-10-CM | POA: Diagnosis not present

## 2016-10-05 NOTE — Progress Notes (Signed)
Daily Session Note  Patient Details  Name: Stefani Baik MRN: [id number] Date of Birth: 1965/12/11 Referring Provider:     Cardiac Rehab from 08/03/2016 in [facility] Cardiac and Pulmonary Rehab  Referring Provider  End      Encounter Date: 10/05/2016  Check In:     Session Check In - 10/05/16 1637      Check-In   Location [facility]-Cardiac & Pulmonary Rehab   Staff Present Earlean Shawl, BS, ACSM CEP, Exercise Physiologist;Katheren Jimmerson Alcus Dad, RN BSN   Supervising physician immediately available to respond to emergencies See telemetry face sheet for immediately available ER MD   Medication changes reported     No   Fall or balance concerns reported    No   Tobacco Cessation No Change   Warm-up and Cool-down Performed on first and last piece of equipment   Resistance Training Performed Yes   VAD Patient? No     Pain Assessment   Currently in Pain? No/denies   Multiple Pain Sites No         History  Smoking Status  . Current Every Day Smoker  . Packs/day: 0.50  . Years: 38.00  . Types: Cigarettes  Smokeless Tobacco  . Never Used    Comment: states she is smoking less than before her NSTEMI. Son is limiting her cigarettes and only giving her 5-10 a day depending on how stressed she is. She would like to quit but has tried multiple methods and has either bad interactions or unsuccessful.    Goals Met:  Independence with exercise equipment Exercise tolerated well No report of cardiac concerns or symptoms Strength training completed today  Goals Unmet:  Not Applicable  Comments: Pt able to follow exercise prescription today without complaint.  Will continue to monitor for progression.   Dr. Emily Filbert is Medical Director for [place] and [facility].

## 2016-10-09 DIAGNOSIS — I214 Non-ST elevation (NSTEMI) myocardial infarction: Secondary | ICD-10-CM

## 2016-10-09 DIAGNOSIS — I252 Old myocardial infarction: Secondary | ICD-10-CM | POA: Diagnosis not present

## 2016-10-09 NOTE — Progress Notes (Signed)
Daily Session Note  Patient Details  Name: Deanna Moran MRN: [id number] Date of Birth: Aug 09, 1965 Referring Provider:     Cardiac Rehab from 08/03/2016 in [facility] Cardiac and Pulmonary Rehab  Referring Provider  End      Encounter Date: 10/09/2016  Check In:     Session Check In - 10/09/16 1721      Check-In   Location [facility]-Cardiac & Pulmonary Rehab   Staff Present Nada Maclachlan, BA, ACSM CEP, Exercise Physiologist;Kelly Amedeo Plenty, BS, ACSM CEP, Exercise Physiologist;Meredith Sherryll Burger, RN BSN   Supervising physician immediately available to respond to emergencies See telemetry face sheet for immediately available ER MD   Medication changes reported     No   Fall or balance concerns reported    No   Tobacco Cessation No Change   Warm-up and Cool-down Performed on first and last piece of equipment   Resistance Training Performed Yes   VAD Patient? No     Pain Assessment   Currently in Pain? No/denies         History  Smoking Status  . Current Every Day Smoker  . Packs/day: 0.50  . Years: 38.00  . Types: Cigarettes  Smokeless Tobacco  . Never Used    Comment: states she is smoking less than before her NSTEMI. Son is limiting her cigarettes and only giving her 5-10 a day depending on how stressed she is. She would like to quit but has tried multiple methods and has either bad interactions or unsuccessful.    Goals Met:  Independence with exercise equipment Exercise tolerated well No report of cardiac concerns or symptoms Strength training completed today  Goals Unmet:  Not Applicable  Comments: Pt able to follow exercise prescription today without complaint.  Will continue to monitor for progression.    Dr. Emily Filbert is Medical Director for [place] and [facility].

## 2016-10-11 ENCOUNTER — Encounter: Payer: Managed Care, Other (non HMO) | Admitting: *Deleted

## 2016-10-11 DIAGNOSIS — I214 Non-ST elevation (NSTEMI) myocardial infarction: Secondary | ICD-10-CM

## 2016-10-11 DIAGNOSIS — I252 Old myocardial infarction: Secondary | ICD-10-CM | POA: Diagnosis not present

## 2016-10-11 NOTE — Progress Notes (Signed)
Daily Session Note  Patient Details  Name: Deanna Moran MRN: [id number] Date of Birth: 07/18/1965 Referring Provider:     Cardiac Rehab from 08/03/2016 in [facility] Cardiac and Pulmonary Rehab  Referring Provider  End      Encounter Date: 10/11/2016  Check In:     Session Check In - 10/11/16 1626      Check-In   Location [facility]-Cardiac & Pulmonary Rehab   Staff Present Renita Papa, RN Vickki Hearing, BA, ACSM CEP, Exercise Physiologist;Carroll Enterkin, RN, BSN   Supervising physician immediately available to respond to emergencies See telemetry face sheet for immediately available ER MD   Medication changes reported     No   Fall or balance concerns reported    No   Warm-up and Cool-down Performed on first and last piece of equipment   Resistance Training Performed Yes   VAD Patient? No     Pain Assessment   Currently in Pain? No/denies           Exercise Prescription Changes - 10/11/16 1400      Response to Exercise   Blood Pressure (Admit) 112/62   Blood Pressure (Exercise) 142/90   Blood Pressure (Exit) 114/66   Heart Rate (Admit) 68 bpm   Heart Rate (Exercise) 116 bpm   Heart Rate (Exit) 66 bpm   Rating of Perceived Exertion (Exercise) 12   Symptoms none   Duration Continue with 45 min of aerobic exercise without signs/symptoms of physical distress.   Intensity THRR unchanged     Progression   Progression Continue to progress workloads to maintain intensity without signs/symptoms of physical distress.   Average METs 3.8     Resistance Training   Training Prescription Yes   Weight 3   Reps 10-15     Interval Training   Interval Training No     Treadmill   MPH 3   Grade 0.5   Minutes 15   METs 3.5     NuStep   Level 3   SPM 80   Minutes 15   METs 4.1      History  Smoking Status  . Current Every Day Smoker  . Packs/day: 0.50  . Years: 38.00  . Types: Cigarettes  Smokeless Tobacco  . Never Used    Comment: states she is smoking  less than before her NSTEMI. Son is limiting her cigarettes and only giving her 5-10 a day depending on how stressed she is. She would like to quit but has tried multiple methods and has either bad interactions or unsuccessful.    Goals Met:  Proper associated with RPD/PD & O2 Sat Independence with exercise equipment Exercise tolerated well No report of cardiac concerns or symptoms Strength training completed today  Goals Unmet:  Not Applicable  Comments: Pt able to follow exercise prescription today without complaint.  Will continue to monitor for progression.    Dr. Emily Filbert is Medical Director for [place] and [facility].

## 2016-10-16 DIAGNOSIS — I252 Old myocardial infarction: Secondary | ICD-10-CM | POA: Diagnosis not present

## 2016-10-16 DIAGNOSIS — I214 Non-ST elevation (NSTEMI) myocardial infarction: Secondary | ICD-10-CM

## 2016-10-16 NOTE — Progress Notes (Signed)
Daily Session Note  Patient Details  Name: Deanna Moran MRN: [id number] Date of Birth: 04/22/1965 Referring Provider:     Cardiac Rehab from 08/03/2016 in [facility] Cardiac and Pulmonary Rehab  Referring Provider  End      Encounter Date: 10/16/2016  Check In:     Session Check In - 10/16/16 1719      Check-In   Location [facility]-Cardiac & Pulmonary Rehab   Staff Present Nada Maclachlan, BA, ACSM CEP, Exercise Physiologist;Kelly Amedeo Plenty, BS, ACSM CEP, Exercise Physiologist;Meredith Sherryll Burger, RN BSN   Supervising physician immediately available to respond to emergencies See telemetry face sheet for immediately available ER MD   Medication changes reported     No   Fall or balance concerns reported    No   Tobacco Cessation No Change   Warm-up and Cool-down Performed on first and last piece of equipment   Resistance Training Performed Yes   VAD Patient? No     Pain Assessment   Currently in Pain? No/denies         History  Smoking Status  . Current Every Day Smoker  . Packs/day: 0.50  . Years: 38.00  . Types: Cigarettes  Smokeless Tobacco  . Never Used    Comment: states she is smoking less than before her NSTEMI. Son is limiting her cigarettes and only giving her 5-10 a day depending on how stressed she is. She would like to quit but has tried multiple methods and has either bad interactions or unsuccessful.    Goals Met:  Independence with exercise equipment Exercise tolerated well No report of cardiac concerns or symptoms Strength training completed today  Goals Unmet:  Not Applicable  Comments: Pt able to follow exercise prescription today without complaint.  Will continue to monitor for progression.    Dr. Emily Filbert is Medical Director for [place] and [facility].

## 2016-10-18 ENCOUNTER — Encounter: Payer: Managed Care, Other (non HMO) | Attending: Internal Medicine | Admitting: *Deleted

## 2016-10-18 VITALS — Ht 70.5 in | Wt 225.2 lb

## 2016-10-18 DIAGNOSIS — Z79899 Other long term (current) drug therapy: Secondary | ICD-10-CM | POA: Diagnosis not present

## 2016-10-18 DIAGNOSIS — I252 Old myocardial infarction: Secondary | ICD-10-CM | POA: Insufficient documentation

## 2016-10-18 DIAGNOSIS — F17210 Nicotine dependence, cigarettes, uncomplicated: Secondary | ICD-10-CM | POA: Insufficient documentation

## 2016-10-18 DIAGNOSIS — Z7902 Long term (current) use of antithrombotics/antiplatelets: Secondary | ICD-10-CM | POA: Insufficient documentation

## 2016-10-18 DIAGNOSIS — I10 Essential (primary) hypertension: Secondary | ICD-10-CM | POA: Diagnosis not present

## 2016-10-18 DIAGNOSIS — E785 Hyperlipidemia, unspecified: Secondary | ICD-10-CM | POA: Diagnosis not present

## 2016-10-18 DIAGNOSIS — I214 Non-ST elevation (NSTEMI) myocardial infarction: Secondary | ICD-10-CM

## 2016-10-18 DIAGNOSIS — E669 Obesity, unspecified: Secondary | ICD-10-CM | POA: Diagnosis not present

## 2016-10-18 DIAGNOSIS — I2510 Atherosclerotic heart disease of native coronary artery without angina pectoris: Secondary | ICD-10-CM | POA: Insufficient documentation

## 2016-10-18 DIAGNOSIS — Z7982 Long term (current) use of aspirin: Secondary | ICD-10-CM | POA: Insufficient documentation

## 2016-10-18 NOTE — Progress Notes (Signed)
Daily Session Note  Patient Details  Name: Deanna Moran MRN: [id number] Date of Birth: 1966-03-08 Referring Provider:     Cardiac Rehab from 08/03/2016 in [facility] Cardiac and Pulmonary Rehab  Referring Provider  End      Encounter Date: 10/18/2016  Check In:     Session Check In - 10/18/16 1650      Check-In   Location [facility]-Cardiac & Pulmonary Rehab   Staff Present Gerlene Burdock, RN, Vickki Hearing, BA, ACSM CEP, Exercise Physiologist;Meredith Sherryll Burger, RN BSN   Supervising physician immediately available to respond to emergencies See telemetry face sheet for immediately available ER MD   Medication changes reported     No   Fall or balance concerns reported    No   Tobacco Cessation No Change   Warm-up and Cool-down Performed on first and last piece of equipment   Resistance Training Performed Yes   VAD Patient? No     Pain Assessment   Currently in Pain? No/denies         History  Smoking Status  . Current Every Day Smoker  . Packs/day: 0.50  . Years: 38.00  . Types: Cigarettes  Smokeless Tobacco  . Never Used    Comment: states she is smoking less than before her NSTEMI. Son is limiting her cigarettes and only giving her 5-10 a day depending on how stressed she is. She would like to quit but has tried multiple methods and has either bad interactions or unsuccessful.    Goals Met:  Proper associated with RPD/PD & O2 Sat Exercise tolerated well Personal goals reviewed No report of cardiac concerns or symptoms Strength training completed today  Goals Unmet:  Not Applicable  Comments:     Dr. Emily Filbert is Medical Director for [place] and [facility].

## 2016-10-19 DIAGNOSIS — I252 Old myocardial infarction: Secondary | ICD-10-CM | POA: Diagnosis not present

## 2016-10-19 DIAGNOSIS — I214 Non-ST elevation (NSTEMI) myocardial infarction: Secondary | ICD-10-CM

## 2016-10-19 NOTE — Progress Notes (Signed)
Daily Session Note  Patient Details  Name: Deanna Moran MRN: [id number] Date of Birth: November 14, 1965 Referring Provider:     Cardiac Rehab from 08/03/2016 in [facility] Cardiac and Pulmonary Rehab  Referring Provider  End      Encounter Date: 10/19/2016  Check In:     Session Check In - 10/19/16 1646      Check-In   Location [facility]-Cardiac & Pulmonary Rehab   Staff Present Gerlene Burdock, RN, Vickki Hearing, BA, ACSM CEP, Exercise Physiologist;Kelly Amedeo Plenty, BS, ACSM CEP, Exercise Physiologist;Nejla Reasor Flavia Shipper   Supervising physician immediately available to respond to emergencies See telemetry face sheet for immediately available ER MD   Medication changes reported     No   Fall or balance concerns reported    No   Tobacco Cessation No Change   Warm-up and Cool-down Performed on first and last piece of equipment   Resistance Training Performed Yes   VAD Patient? No     Pain Assessment   Currently in Pain? No/denies   Multiple Pain Sites No         History  Smoking Status  . Current Every Day Smoker  . Packs/day: 0.50  . Years: 38.00  . Types: Cigarettes  Smokeless Tobacco  . Never Used    Comment: states she is smoking less than before her NSTEMI. Son is limiting her cigarettes and only giving her 5-10 a day depending on how stressed she is. She would like to quit but has tried multiple methods and has either bad interactions or unsuccessful.    Goals Met:  Independence with exercise equipment Exercise tolerated well No report of cardiac concerns or symptoms Strength training completed today  Goals Unmet:  Not Applicable  Comments: Pt able to follow exercise prescription today without complaint.  Will continue to monitor for progression.   Dr. Emily Filbert is Medical Director for [place] and [facility].

## 2016-10-23 ENCOUNTER — Encounter: Payer: Managed Care, Other (non HMO) | Admitting: *Deleted

## 2016-10-23 DIAGNOSIS — I214 Non-ST elevation (NSTEMI) myocardial infarction: Secondary | ICD-10-CM

## 2016-10-23 DIAGNOSIS — I252 Old myocardial infarction: Secondary | ICD-10-CM | POA: Diagnosis not present

## 2016-10-23 NOTE — Progress Notes (Signed)
Daily Session Note  Patient Details  Name: Deanna Moran MRN: [id number] Date of Birth: [birth year] Referring Provider:     Cardiac Rehab from 08/03/2016 in [facility] Cardiac and Pulmonary Rehab  Referring Provider  End      Encounter Date: 10/23/2016  Check In:     Session Check In - 10/23/16 1726      Check-In   Location [facility]-Cardiac & Pulmonary Rehab   Staff Present Nyoka Cowden, RN, BSN, [place]nita [place], BS, ACSM CEP, Exercise Physiologist;Other  Carson Myrtle, RRT   Supervising physician immediately available to respond to emergencies See telemetry face sheet for immediately available ER MD   Medication changes reported     No   Fall or balance concerns reported    No   Tobacco Cessation No Change   Warm-up and Cool-down Performed on first and last piece of equipment   Resistance Training Performed Yes   VAD Patient? No     Pain Assessment   Currently in Pain? No/denies         History  Smoking Status  . Current Every Day Smoker  . Packs/day: 0.50  . Years: 38.00  . Types: Cigarettes  Smokeless Tobacco  . Never Used    Comment: states she is smoking less than before her NSTEMI. Son is limiting her cigarettes and only giving her 5-10 a day depending on how stressed she is. She would like to quit but has tried multiple methods and has either bad interactions or unsuccessful.    Goals Met:  Proper associated with RPD/PD & O2 Sat Exercise tolerated well No report of cardiac concerns or symptoms Strength training completed today  Goals Unmet:  Not Applicable  Comments:     Dr. Emily Filbert is Medical Director for [place] and [facility].

## 2016-10-25 ENCOUNTER — Encounter: Payer: Managed Care, Other (non HMO) | Admitting: *Deleted

## 2016-10-25 DIAGNOSIS — I214 Non-ST elevation (NSTEMI) myocardial infarction: Secondary | ICD-10-CM

## 2016-10-25 DIAGNOSIS — I252 Old myocardial infarction: Secondary | ICD-10-CM | POA: Diagnosis not present

## 2016-10-25 NOTE — Progress Notes (Signed)
Daily Session Note  Patient Details  Name: Deanna Moran MRN: [id number] Date of Birth: [birth year] Referring Provider:     Cardiac Rehab from 08/03/2016 in [facility] Cardiac and Pulmonary Rehab  Referring Provider  End      Encounter Date: 10/25/2016  Check In:     Session Check In - 10/25/16 1740      Check-In   Location [facility]-Cardiac & Pulmonary Rehab   Staff Present Nyoka Cowden, RN, BSN, MA;Susanne Bice, RN, BSN, CCRP;Meredith Sherryll Burger, RN BSN   Supervising physician immediately available to respond to emergencies See telemetry face sheet for immediately available ER MD   Medication changes reported     No   Fall or balance concerns reported    No   Warm-up and Cool-down Performed on first and last piece of equipment   Resistance Training Performed Yes   VAD Patient? No     Pain Assessment   Currently in Pain? No/denies         History  Smoking Status  . Current Every Day Smoker  . Packs/day: 0.50  . Years: 38.00  . Types: Cigarettes  Smokeless Tobacco  . Never Used    Comment: states she is smoking less than before her NSTEMI. Son is limiting her cigarettes and only giving her 5-10 a day depending on how stressed she is. She would like to quit but has tried multiple methods and has either bad interactions or unsuccessful.    Goals Met:  Proper associated with RPD/PD & O2 Sat Independence with exercise equipment Exercise tolerated well No report of cardiac concerns or symptoms Strength training completed today  Goals Unmet:  Not Applicable  Comments: Pt able to follow exercise prescription today without complaint.  Will continue to monitor for progression.    Dr. Emily Filbert is Medical Director for [place] and [facility].

## 2016-10-26 ENCOUNTER — Encounter: Payer: Managed Care, Other (non HMO) | Admitting: *Deleted

## 2016-10-26 DIAGNOSIS — I214 Non-ST elevation (NSTEMI) myocardial infarction: Secondary | ICD-10-CM

## 2016-10-26 DIAGNOSIS — I252 Old myocardial infarction: Secondary | ICD-10-CM | POA: Diagnosis not present

## 2016-10-26 NOTE — Patient Instructions (Signed)
Discharge Instructions  Patient Details  Name: Deanna Moran MRN: [id number] Date of Birth: 07/17/1965 Referring Provider:  Nelva Bush, MD   Number of Visits: 36/36  Reason for Discharge:  Patient reached a stable level of exercise. Patient independent in their exercise.  Smoking History:  History  Smoking Status  . Current Every Day Smoker  . Packs/day: 0.50  . Years: 38.00  . Types: Cigarettes  Smokeless Tobacco  . Never Used    Comment: states she is smoking less than before her NSTEMI. Son is limiting her cigarettes and only giving her 5-10 a day depending on how stressed she is. She would like to quit but has tried multiple methods and has either bad interactions or unsuccessful.    Diagnosis:  NSTEMI (non-ST elevated myocardial infarction) [facility])  Initial Exercise Prescription:     Initial Exercise Prescription - 08/03/16 1400      Date of Initial Exercise RX and Referring Provider   Date 08/03/16   Referring Provider End     Treadmill   MPH 2.3   Grade 1   Minutes 15   METs 3.08     Recumbant Bike   Level 2   RPM 60   Watts 40   Minutes 15   METs 3.14     NuStep   Level 3   SPM 80   Minutes 15   METs 3     REL-XR   Level 3   Speed 50   Minutes 15   METs 3     Prescription Details   Frequency (times per week) 3   Duration Progress to 30 minutes of continuous aerobic without signs/symptoms of physical distress     Intensity   THRR 40-80% of Max Heartrate 106-147   Ratings of Perceived Exertion 11-13     Resistance Training   Training Prescription Yes   Weight 3   Reps 10-15      Discharge Exercise Prescription (Final Exercise Prescription Changes):     Exercise Prescription Changes - 10/26/16 1000      Response to Exercise   Blood Pressure (Admit) 128/64   Blood Pressure (Exercise) 154/56   Blood Pressure (Exit) 122/62   Heart Rate (Admit) 77 bpm   Heart Rate (Exercise) 121 bpm   Heart Rate (Exit) 66 bpm   Rating of  Perceived Exertion (Exercise) 14   Symptoms none   Duration Continue with 45 min of aerobic exercise without signs/symptoms of physical distress.   Intensity THRR unchanged     Progression   Progression Continue to progress workloads to maintain intensity without signs/symptoms of physical distress.   Average METs 3.5     Resistance Training   Training Prescription Yes   Weight 3 lbs   Reps 10-15     Interval Training   Interval Training Yes   Equipment NuStep;REL-XR   Comments 2 min off 30sec on     Treadmill   MPH 3   Grade 0.5   Minutes 15   METs 3.5     NuStep   Level 5   Minutes 15   METs 3.2     REL-XR   Level 3   Minutes 15   METs 4     Home Exercise Plan   Plans to continue exercise at Home (comment)   Frequency Add 2 additional days to program exercise sessions.   Initial Home Exercises Provided 08/21/16      Functional Capacity:     6 Minute  Walk    Row Name 08/03/16 1441 10/19/16 1027       6 Minute Walk   Phase  - Discharge    Distance 1186 feet 1565 feet    Distance % Change  - 32 %  379 ft    Walk Time 6 minutes 6 minutes    # of Rest Breaks 0 0    MPH 2.24 2.96    METS 3.66 3.66    RPE 13 12    VO2 Peak 12.8 14.93    Symptoms Yes (comment) No    Comments back pain 5/10  -    Resting HR 65 bpm 75 bpm    Resting BP 148/78 128/64    Max Ex. HR 108 bpm 102 bpm    Max Ex. BP 164/80 148/70    2 Minute Post BP 128/68  -       Quality of Life:     Quality of Life - 10/18/16 1808      Quality of Life Scores   Health/Function Post 20.85 %   Socioeconomic Post 20.14 %   Psych/Spiritual Post 20.86 %   Family Post 21 %   GLOBAL Post 20.71 %      Personal Goals: Goals established at orientation with interventions provided to work toward goal.     Personal Goals and Risk Factors at Admission - 08/23/16 1701      Core Components/Risk Factors/Patient Goals on Admission   Number of packs per day smoking 3-5 cigarettes/day. She  said her son hands them out to her but is trying to help her cut back.    Expected Outcomes Short Term: Will demonstrate readiness to quit, by selecting a quit date.;Long Term: Complete abstinence from all tobacco products for at least 12 months from quit date.   Intervention Provide education, individualized exercise plan and daily activity instruction to help decrease symptoms of SOB with activities of daily living.   Expected Outcomes Short Term: Achieves a reduction of symptoms when performing activities of daily living.       Personal Goals Discharge:     Goals and Risk Factor Review - 10/18/16 1652      Core Components/Risk Factors/Patient Goals Review   Personal Goals Review Tobacco Cessation;Weight Management/Obesity   Review Meline said she is still smoking 1/2pack cigarettes/day but her son is still limiting her. [facility] said she has too much stress at work to cut back any more. She said her possibly quit date is May of 2019.225lbs today -Dorri said she is proud of herself since she has lost 15lbs and lost 2 inches in her waist and walked a lot more during her 78minute walk. [place] recently got to visit her son who is in the [employer] and she said that was a great visit.    Expected Outcomes Hopefully to decrease her cigarette smoking. Cont to improve on her 6 minute walk like she did today.       Nutrition & Weight - Outcomes:     Pre Biometrics - 08/03/16 1441      Pre Biometrics   Height 5' 10.5" (1.791 m)   Weight 240 lb 9.6 oz (109.1 kg)   Waist Circumference 44 inches   Hip Circumference 47.5 inches   Waist to Hip Ratio 0.93 %   BMI (Calculated) 34.1   Single Leg Stand 26.57 seconds         Post Biometrics - 10/19/16 [place]  Height 5' 10.5" (1.791 m)   Weight 225 lb 3.2 oz (102.2 kg)   Waist Circumference 42 inches   Hip Circumference 46 inches   Waist to Hip Ratio 0.91 %   BMI (Calculated) 31.9   Single Leg Stand 14.56 seconds       Nutrition:     Nutrition Therapy & Goals - 10/18/16 1650      Nutrition Therapy   RD appointment defered Yes      Nutrition Discharge:     Nutrition Assessments - 08/03/16 1254      MEDFICTS Scores   Pre Score 29      Education Questionnaire Score:     Knowledge Questionnaire Score - 10/18/16 1808      Knowledge Questionnaire Score   Post Score 28/28      Goals reviewed with patient; copy given to patient.

## 2016-10-26 NOTE — Progress Notes (Signed)
Daily Session Note  Patient Details  Name: Deanna Moran MRN: [id number] Date of Birth: 1965-04-20 Referring Provider:     Cardiac Rehab from 08/03/2016 in [facility] Cardiac and Pulmonary Rehab  Referring Provider  End      Encounter Date: 10/26/2016  Check In:     Session Check In - 10/26/16 1732      Check-In   Location [facility]-Cardiac & Pulmonary Rehab   Staff Present Heath Lark, RN, BSN, CCRP;Carroll Enterkin, RN, BSN;Joseph [employer] physician immediately available to respond to emergencies See telemetry face sheet for immediately available ER MD   Medication changes reported     No   Fall or balance concerns reported    No   Warm-up and Cool-down Performed on first and last piece of equipment   Resistance Training Performed Yes   VAD Patient? No     Pain Assessment   Currently in Pain? No/denies           Exercise Prescription Changes - 10/26/16 1000      Response to Exercise   Blood Pressure (Admit) 128/64   Blood Pressure (Exercise) 154/56   Blood Pressure (Exit) 122/62   Heart Rate (Admit) 77 bpm   Heart Rate (Exercise) 121 bpm   Heart Rate (Exit) 66 bpm   Rating of Perceived Exertion (Exercise) 14   Symptoms none   Duration Continue with 45 min of aerobic exercise without signs/symptoms of physical distress.   Intensity THRR unchanged     Progression   Progression Continue to progress workloads to maintain intensity without signs/symptoms of physical distress.   Average METs 3.5     Resistance Training   Training Prescription Yes   Weight 3 lbs   Reps 10-15     Interval Training   Interval Training Yes   Equipment NuStep;REL-XR   Comments 2 min off 30sec on     Treadmill   MPH 3   Grade 0.5   Minutes 15   METs 3.5     NuStep   Level 5   Minutes 15   METs 3.2     REL-XR   Level 3   Minutes 15   METs 4     Home Exercise Plan   Plans to continue exercise at Home (comment)   Frequency Add 2 additional days to  program exercise sessions.   Initial Home Exercises Provided 08/21/16      History  Smoking Status  . Current Every Day Smoker  . Packs/day: 0.50  . Years: 38.00  . Types: Cigarettes  Smokeless Tobacco  . Never Used    Comment: states she is smoking less than before her NSTEMI. Son is limiting her cigarettes and only giving her 5-10 a day depending on how stressed she is. She would like to quit but has tried multiple methods and has either bad interactions or unsuccessful.    Goals Met:  Exercise tolerated well No report of cardiac concerns or symptoms Strength training completed today  Goals Unmet:  Not Applicable  Comments: Doing well with exercise prescription progression.    Dr. Emily Filbert is Medical Director for [place] and [facility].

## 2016-10-30 DIAGNOSIS — I214 Non-ST elevation (NSTEMI) myocardial infarction: Secondary | ICD-10-CM

## 2016-10-30 DIAGNOSIS — I252 Old myocardial infarction: Secondary | ICD-10-CM | POA: Diagnosis not present

## 2016-10-30 NOTE — Progress Notes (Signed)
Daily Session Note  Patient Details  Name: Deanna Moran MRN: [id number] Date of Birth: 12/23/1965 Referring Provider:     Cardiac Rehab from 08/03/2016 in [facility] Cardiac and Pulmonary Rehab  Referring Provider  End      Encounter Date: 10/30/2016  Check In:     Session Check In - 10/30/16 1751      Check-In   Location [facility]-Cardiac & Pulmonary Rehab   Staff Present Nada Maclachlan, BA, ACSM CEP, Exercise Physiologist;Kelly Amedeo Plenty, BS, ACSM CEP, Exercise Physiologist;Meredith Sherryll Burger, RN BSN   Supervising physician immediately available to respond to emergencies See telemetry face sheet for immediately available ER MD   Medication changes reported     No   Fall or balance concerns reported    No   Warm-up and Cool-down Performed on first and last piece of equipment   Resistance Training Performed Yes   VAD Patient? No     Pain Assessment   Currently in Pain? No/denies         History  Smoking Status  . Current Every Day Smoker  . Packs/day: 0.50  . Years: 38.00  . Types: Cigarettes  Smokeless Tobacco  . Never Used    Comment: states she is smoking less than before her NSTEMI. Son is limiting her cigarettes and only giving her 5-10 a day depending on how stressed she is. She would like to quit but has tried multiple methods and has either bad interactions or unsuccessful.    Goals Met:  Independence with exercise equipment Exercise tolerated well No report of cardiac concerns or symptoms Strength training completed today  Goals Unmet:  Not Applicable  Comments:  Laylana graduated today from cardiac rehab with 36 sessions completed.  Details of the patient's exercise prescription and what she needs to do in order to continue the prescription and progress were discussed with patient.  Patient was given a copy of prescription and goals.  Patient verbalized understanding.  Inola plans to continue to exercise by going to [employer].    Dr. Emily Filbert is Medical  Director for [place] and [facility].

## 2016-10-31 DIAGNOSIS — I214 Non-ST elevation (NSTEMI) myocardial infarction: Secondary | ICD-10-CM

## 2016-10-31 NOTE — Progress Notes (Signed)
Cardiac Individual Treatment Plan  Patient Details  Name: Deanna Moran MRN: [id number] Date of Birth: 08-Oct-1965 Referring Provider:     Cardiac Rehab from 08/03/2016 in [facility] Cardiac and Pulmonary Rehab  Referring Provider  End      Initial Encounter Date:    Cardiac Rehab from 08/03/2016 in [facility] Cardiac and Pulmonary Rehab  Date  08/03/16  Referring Provider  End      Visit Diagnosis: NSTEMI (non-ST elevated myocardial infarction) [facility])  Patient's Home Medications on Admission:  Current Outpatient Prescriptions:  .  aspirin 81 MG EC tablet, Take 1 tablet (81 mg total) by mouth daily., Disp: 30 tablet, Rfl: 3 .  atorvastatin (LIPITOR) 80 MG tablet, Take 1 tablet (80 mg total) by mouth daily at 6 PM., Disp: 30 tablet, Rfl: 3 .  carvedilol (COREG) 3.125 MG tablet, Take 1 tablet (3.125 mg total) by mouth 2 (two) times daily with a meal., Disp: 60 tablet, Rfl: 2 .  clopidogrel (PLAVIX) 75 MG tablet, Take 1 tablet (75 mg total) by mouth daily with breakfast., Disp: 30 tablet, Rfl: 2 .  losartan (COZAAR) 25 MG tablet, Take 1 tablet (25 mg total) by mouth daily., Disp: 90 tablet, Rfl: 3 .  naproxen sodium (ANAPROX) 220 MG tablet, Take 220 mg by mouth as needed., Disp: , Rfl:  .  nitroGLYCERIN (NITROSTAT) 0.4 MG SL tablet, Place 1 tablet (0.4 mg total) under the tongue every 5 (five) minutes x 3 doses as needed for chest pain., Disp: 30 tablet, Rfl: 2 .  prednisoLONE acetate (PRED FORTE) 1 % ophthalmic suspension, Place 2 drops into both eyes 4 (four) times daily., Disp: , Rfl:   Past Medical History: Past Medical History:  Diagnosis Date  . Chronic back pain   . Coronary artery disease   . HLD (hyperlipidemia)   . HTN (hypertension)   . NSTEMI (non-ST elevated myocardial infarction) ([place]) 06/2016  . Obesity     Tobacco Use: History  Smoking Status  . Current Every Day Smoker  . Packs/day: 0.50  . Years: 38.00  . Types: Cigarettes  Smokeless Tobacco  . Never Used     Comment: states she is smoking less than before her NSTEMI. Son is limiting her cigarettes and only giving her 5-10 a day depending on how stressed she is. She would like to quit but has tried multiple methods and has either bad interactions or unsuccessful.    Labs: Recent Review Flowsheet Data    Labs for ITP Cardiac and Pulmonary Rehab Latest Ref Rng & Units 07/10/2016 07/11/2016 08/30/2016   Cholestrol 100 - 199 mg/dL - 144 120   LDLCALC 0 - 99 mg/dL - 103(H) 74   LDLDIRECT 0 - 99 mg/dL - - 76   HDL >39 mg/dL - 31(L) 32(L)   Trlycerides 0 - 149 mg/dL - 52 68   Hemoglobin A1c 4.8 - 5.6 % 5.7(H) - -       Exercise Target Goals:    Exercise Program Goal: Individual exercise prescription set with THRR, safety & activity barriers. Participant demonstrates ability to understand and report RPE using BORG scale, to self-measure pulse accurately, and to acknowledge the importance of the exercise prescription.  Exercise Prescription Goal: Starting with aerobic activity 30 plus minutes a day, 3 days per week for initial exercise prescription. Provide home exercise prescription and guidelines that participant acknowledges understanding prior to discharge.  Activity Barriers & Risk Stratification:     Activity Barriers & Cardiac Risk Stratification - 08/03/16 1355  Activity Barriers & Cardiac Risk Stratification   Activity Barriers Joint Problems;Back Problems  She has scoliosis in her back causing 24/7 pain in her back that she has learned to deal with. She also has pain in her right hip but is not a surgical candidate at this time.    Cardiac Risk Stratification High      6 Minute Walk:     6 Minute Walk    Row Name 08/03/16 1441 10/19/16 1027       6 Minute Walk   Phase  - Discharge    Distance 1186 feet 1565 feet    Distance % Change  - 32 %  379 ft    Walk Time 6 minutes 6 minutes    # of Rest Breaks 0 0    MPH 2.24 2.96    METS 3.66 3.66    RPE 13 12    VO2 Peak  12.8 14.93    Symptoms Yes (comment) No    Comments back pain 5/10  -    Resting HR 65 bpm 75 bpm    Resting BP 148/78 128/64    Max Ex. HR 108 bpm 102 bpm    Max Ex. BP 164/80 148/70    2 Minute Post BP 128/68  -       Oxygen Initial Assessment:   Oxygen Re-Evaluation:   Oxygen Discharge (Final Oxygen Re-Evaluation):   Initial Exercise Prescription:     Initial Exercise Prescription - 08/03/16 1400      Date of Initial Exercise RX and Referring Provider   Date 08/03/16   Referring Provider End     Treadmill   MPH 2.3   Grade 1   Minutes 15   METs 3.08     Recumbant Bike   Level 2   RPM 60   Watts 40   Minutes 15   METs 3.14     NuStep   Level 3   SPM 80   Minutes 15   METs 3     REL-XR   Level 3   Speed 50   Minutes 15   METs 3     Prescription Details   Frequency (times per week) 3   Duration Progress to 30 minutes of continuous aerobic without signs/symptoms of physical distress     Intensity   THRR 40-80% of Max Heartrate 106-147   Ratings of Perceived Exertion 11-13     Resistance Training   Training Prescription Yes   Weight 3   Reps 10-15      Perform Capillary Blood Glucose checks as needed.  Exercise Prescription Changes:     Exercise Prescription Changes    Row Name 08/03/16 1300 08/17/16 1100 08/21/16 1600 08/31/16 1400 09/29/16 1200     Response to Exercise   Blood Pressure (Admit) 148/78 126/68  - 110/64 112/60   Blood Pressure (Exercise) 164/80 122/68  - 126/68 150/60   Blood Pressure (Exit) 128/68 138/70  - 102/64 120/62   Heart Rate (Admit) 76 bpm 71 bpm  - 48 bpm 83 bpm   Heart Rate (Exercise) 108 bpm 98 bpm  - 95 bpm 106 bpm   Heart Rate (Exit) 68 bpm 68 bpm  - 60 bpm 80 bpm   Rating of Perceived Exertion (Exercise) 13 12  - 12 11.5   Symptoms  -  -  -  - one   Duration  -  -  -  - Continue with 45 min of aerobic exercise  without signs/symptoms of physical distress.   Intensity  -  -  -  - THRR unchanged      Progression   Progression  -  -  -  - Continue to progress workloads to maintain intensity without signs/symptoms of physical distress.   Average METs  -  -  -  - 3.4     Resistance Training   Training Prescription  - Yes Yes Yes Yes   Weight  - '2 2 3 3 '$ lb   Reps  - 10-15 10-15 10-15 10-15     Interval Training   Interval Training  -  -  -  - No     Treadmill   MPH  -  -  - 2.3  -   Grade  -  -  - 0  -   Minutes  -  -  - 15  -   METs  -  -  - 2.76  -     NuStep   Level  - '3 3 3 3   '$ SPM  - 80 80 80 80   Minutes  - '15 15 15 15   '$ METs  - 2.4 2.4 3 3.4     REL-XR   Level  - 3 3  - 3   Speed  - 50 50  -  -   Minutes  - 15 15  - 15   METs  - 2.5 2.5  -  -     Home Exercise Plan   Plans to continue exercise at  -  - Home (comment)  -  -   Frequency  -  - Add 2 additional days to program exercise sessions.  -  -   Initial Home Exercises Provided  -  - 08/21/16  -  -   Deanna Moran Name 10/11/16 1400 10/26/16 1000           Response to Exercise   Blood Pressure (Admit) 112/62 128/64      Blood Pressure (Exercise) 142/90 154/56      Blood Pressure (Exit) 114/66 122/62      Heart Rate (Admit) 68 bpm 77 bpm      Heart Rate (Exercise) 116 bpm 121 bpm      Heart Rate (Exit) 66 bpm 66 bpm      Rating of Perceived Exertion (Exercise) 12 14      Symptoms none none      Duration Continue with 45 min of aerobic exercise without signs/symptoms of physical distress. Continue with 45 min of aerobic exercise without signs/symptoms of physical distress.      Intensity THRR unchanged THRR unchanged        Progression   Progression Continue to progress workloads to maintain intensity without signs/symptoms of physical distress. Continue to progress workloads to maintain intensity without signs/symptoms of physical distress.      Average METs 3.8 3.5        Resistance Training   Training Prescription Yes Yes      Weight 3 3 lbs      Reps 10-15 10-15        Interval Training   Interval  Training No Yes      Equipment  - NuStep;REL-XR      Comments  - 2 min off 30sec on        Treadmill   MPH 3 3      Grade 0.5 0.5      Minutes 15 15  METs 3.5 3.5        NuStep   Level 3 5      SPM 80  -      Minutes 15 15      METs 4.1 3.2        REL-XR   Level  - 3      Minutes  - 15      METs  - 4        Home Exercise Plan   Plans to continue exercise at  - Home (comment)      Frequency  - Add 2 additional days to program exercise sessions.      Initial Home Exercises Provided  - 08/21/16         Exercise Comments:     Exercise Comments    Row Name 08/16/16 1818 08/21/16 1655 08/31/16 1501       Exercise Comments First full day of exercise!  Patient was oriented to gym and equipment including functions, settings, policies, and procedures.  Patient's individual exercise prescription and treatment plan were reviewed.  All starting workloads were established based on the results of the 6 minute walk test done at initial orientation visit.  The plan for exercise progression was also introduced and progression will be customized based on patient's performance and goals. Reviewed home exercise with pt today.  Pt plans to 2 for exercise.  Reviewed THR, pulse, RPE, sign and symptoms, NTG use, and when to call 911 or MD.  Also discussed weather considerations and indoor options.  Pt voiced understanding. Reviewed these guidlines so soon due to patient leaving the country to travel.  Deanna Moran will be on vacation the next couple weeks.  She has tolerated exercise very well in her first sessions.        Exercise Goals and Review:     Exercise Goals    Row Name 08/03/16 1441             Exercise Goals   Increase Physical Activity Yes       Intervention Provide advice, education, support and counseling about physical activity/exercise needs.;Develop an individualized exercise prescription for aerobic and resistive training based on initial evaluation findings, risk  stratification, comorbidities and participant's personal goals.       Expected Outcomes Achievement of increased cardiorespiratory fitness and enhanced flexibility, muscular endurance and strength shown through measurements of functional capacity and personal statement of participant.       Increase Strength and Stamina Yes       Intervention Provide advice, education, support and counseling about physical activity/exercise needs.;Develop an individualized exercise prescription for aerobic and resistive training based on initial evaluation findings, risk stratification, comorbidities and participant's personal goals.       Expected Outcomes Achievement of increased cardiorespiratory fitness and enhanced flexibility, muscular endurance and strength shown through measurements of functional capacity and personal statement of participant.          Exercise Goals Re-Evaluation :     Exercise Goals Re-Evaluation    Row Name 08/21/16 1651 09/12/16 1503 09/29/16 1255 10/11/16 1417 10/26/16 1051     Exercise Goal Re-Evaluation   Exercise Goals Review Increase Physical Activity  - Increase Physical Activity;Increase Strenth and Stamina Increase Physical Activity;Increase Strenth and Stamina Increase Physical Activity;Increase Strenth and Stamina   Comments Reviewed home exercise guidelines with patient and recommended adding 2 extra days of exercise at home outside of class.  Out of town for vacation since last review. Artis is  progressing well with exercise and has good family support.  SHe plans to go to [employer]. Deanna Moran has added speed and grade to the TM.  We plan to start interval training today on the NS and XR. Rand has been doing well in rehab.  She is doing well with interval training and is enjoying it.  We will continue to monitor her progression.   Expected Outcomes Patient will use these guideline to exercise safely at home.   - Short - Deanna Moran will continue to improve overall  fitness.  Long - Deanna Moran will complete the HT program. Short - Deanna Moran will incorporate interval training in her program.  Long - Deanna Moran will continue to see improvement in overall strength and stamina.  Short: Continue with interval training and maybe try it on the treadmill.  Long: Continue to work on IT sales professional.       Discharge Exercise Prescription (Final Exercise Prescription Changes):     Exercise Prescription Changes - 10/26/16 1000      Response to Exercise   Blood Pressure (Admit) 128/64   Blood Pressure (Exercise) 154/56   Blood Pressure (Exit) 122/62   Heart Rate (Admit) 77 bpm   Heart Rate (Exercise) 121 bpm   Heart Rate (Exit) 66 bpm   Rating of Perceived Exertion (Exercise) 14   Symptoms none   Duration Continue with 45 min of aerobic exercise without signs/symptoms of physical distress.   Intensity THRR unchanged     Progression   Progression Continue to progress workloads to maintain intensity without signs/symptoms of physical distress.   Average METs 3.5     Resistance Training   Training Prescription Yes   Weight 3 lbs   Reps 10-15     Interval Training   Interval Training Yes   Equipment NuStep;REL-XR   Comments 2 min off 30sec on     Treadmill   MPH 3   Grade 0.5   Minutes 15   METs 3.5     NuStep   Level 5   Minutes 15   METs 3.2     REL-XR   Level 3   Minutes 15   METs 4     Home Exercise Plan   Plans to continue exercise at Home (comment)   Frequency Add 2 additional days to program exercise sessions.   Initial Home Exercises Provided 08/21/16      Nutrition:  Target Goals: Understanding of nutrition guidelines, daily intake of sodium '1500mg'$ , cholesterol '200mg'$ , calories 30% from fat and 7% or less from saturated fats, daily to have 5 or more servings of fruits and vegetables.  Biometrics:     Pre Biometrics - 08/03/16 1441      Pre Biometrics   Height 5' 10.5" (1.791 m)   Weight 240 lb 9.6 oz (109.1 kg)   Waist  Circumference 44 inches   Hip Circumference 47.5 inches   Waist to Hip Ratio 0.93 %   BMI (Calculated) 34.1   Single Leg Stand 26.57 seconds         Post Biometrics - 10/19/16 1026       Post  Biometrics   Height 5' 10.5" (1.791 m)   Weight 225 lb 3.2 oz (102.2 kg)   Waist Circumference 42 inches   Hip Circumference 46 inches   Waist to Hip Ratio 0.91 %   BMI (Calculated) 31.9   Single Leg Stand 14.56 seconds      Nutrition Therapy Plan and Nutrition Goals:  Nutrition Therapy & Goals - 10/18/16 1650      Nutrition Therapy   RD appointment defered Yes      Nutrition Discharge: Rate Your Plate Scores:     Nutrition Assessments - 08/03/16 1254      MEDFICTS Scores   Pre Score 29      Nutrition Goals Re-Evaluation:     Nutrition Goals Re-Evaluation    Row Name 10/18/16 1651             Goals   Current Weight 225 lb (102.1 kg)       Comment Deanna Moran reports she has cut back on a lot of things.        Expected Outcome Heart healthy eating.           Nutrition Goals Discharge (Final Nutrition Goals Re-Evaluation):     Nutrition Goals Re-Evaluation - 10/18/16 1651      Goals   Current Weight 225 lb (102.1 kg)   Comment Deanna Moran reports she has cut back on a lot of things.    Expected Outcome Heart healthy eating.       Psychosocial: Target Goals: Acknowledge presence or absence of significant depression and/or stress, maximize coping skills, provide positive support system. Participant is able to verbalize types and ability to use techniques and skills needed for reducing stress and depression.   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 She gets very short of breath with activity and would like to be able to do more things around the house without taking breaks. She also states she has some mild anxiety and times of feeling sorry for herself and getting emotional but her son helps her get through them fairly quickly.      Family Dynamics   Good Support System? Yes   Comments Her 73 year old son is her main support as well as some co-workers. Her older son is overseas in [place] where she will be visiting this summer. Her ex-husband is her emergency contact.      Barriers   Psychosocial barriers to participate in program The patient should benefit from training in stress management and relaxation.     Screening Interventions   Interventions Program counselor consult;Encouraged to exercise;To provide support and resources with identified psychosocial needs;Provide feedback about the scores to participant      Quality of Life Scores:      Quality of Life - 10/18/16 1808      Quality of Life Scores   Health/Function Post 20.85 %   Socioeconomic Post 20.14 %   Psych/Spiritual Post 20.86 %   Family Post 21 %   GLOBAL Post 20.71 %      PHQ-9: Recent Review Flowsheet Data    Depression screen [facility] 2/9 10/18/2016 08/03/2016   Decreased Interest 0 0   Down, Depressed, Hopeless 0 1   PHQ - 2 Score 0 1   Altered sleeping 0 0   Tired, decreased energy 0 1   Change in appetite 0 0   Feeling bad or failure about yourself  0 1   Trouble concentrating 0 0   Moving slowly or  fidgety/restless 0 0   Suicidal thoughts 0 0   PHQ-9 Score 0 3   Difficult doing work/chores Not difficult at all Somewhat difficult     Interpretation of Total Score  Total Score Depression Severity:  1-4 = Minimal depression, 5-9 = Mild depression, 10-14 = Moderate depression, 15-19 = Moderately severe depression, 20-27 = Severe depression   Psychosocial Evaluation and Intervention:     Psychosocial  Evaluation - 08/23/16 1707      Psychosocial Evaluation & Interventions   Interventions Encouraged to exercise with the program and follow exercise prescription;Stress management education;Relaxation education   Comments Counselor met with Deanna Moran today for intial psychosocial evaluation.  She is a 51 year old who had a heart attack in April.  Teeghan has a strong support system with her 34 year old son living at home with her; her co-workers who are very supportive and her 29 year old son who lives in [place].  Deanna Moran plans to visit him in the near future as she hasn't seen him in 2 years.  She reports sleeping better recently and having a good appetite.  She denies a history of depression or anxiety or any current symptoms.  Deanna Moran states she is typically in a positive mood although she has multiple stressors in her life with finances; single parenting for the last 9 years and her health.  She copes by focusing on one day at a time and trying to keep a positive attitude.  Her goals for this program are to get stronger and healthier.  Jazia will be followed by staff throughout the course of this program.  [place] son in [place] who is in the [employer].  Counselor commended her for her commitment to consistently exercising.    Expected Outcomes Deanna Moran will benefit from consistent exercising to achieve her stated goals.  She will also benefit from the psychoeducational components of this program to learn more positive ways of coping with stress.  Yenty is trying to quit smoking and admits this is a stress reducer for her to smoke.  So learning new ways to deal with stress will be helpful for her.     Continue Psychosocial Services  Follow up required by staff      Psychosocial Re-Evaluation:     Psychosocial Re-Evaluation    [place] Name 08/23/16 1703 10/18/16 1658           Psychosocial Re-Evaluation   Current issues with Current Stress  Concerns  -      Comments Deanna Moran said she feels Cardiac REhab has helped her stress. She is talking with another participant who is a single mother so they have alot in common.  Stress at work so she said she can't quit smoking for a while yet.       Expected Outcomes  - Cont to take deep breaths when she gets stressed and can't smoke a cigarette.      Interventions Encouraged to attend Cardiac Rehabilitation for the exercise Encouraged to attend Cardiac Rehabilitation for the exercise         Psychosocial Discharge (Final Psychosocial Re-Evaluation):     Psychosocial Re-Evaluation - 10/18/16 1658      Psychosocial Re-Evaluation   Comments Stress at work so she said she can't quit smoking for a while yet.    Expected Outcomes Cont to take deep breaths when she gets stressed and can't smoke a cigarette.   Interventions Encouraged to attend  Cardiac Rehabilitation for the exercise      Vocational Rehabilitation: Provide vocational rehab assistance to qualifying candidates.   Vocational Rehab Evaluation & Intervention:     Vocational Rehab - 08/03/16 1405      Initial Vocational Rehab Evaluation & Intervention   Assessment shows need for Vocational Rehabilitation No      Education: Education Goals: Education classes will be provided on a weekly basis, covering required topics. Participant will state understanding/return demonstration of topics presented.  Learning Barriers/Preferences:     Learning Barriers/Preferences - 08/03/16 1405      Learning Barriers/Preferences   Learning Barriers None   Learning Preferences Individual Instruction      Education Topics: General Nutrition Guidelines/Fats and Fiber: -Group instruction provided by verbal, written material, models and posters to present the general guidelines for heart healthy nutrition. Gives an explanation and review of dietary fats and fiber.   Controlling Sodium/Reading Food Labels: -Group verbal and written  material supporting the discussion of sodium use in heart healthy nutrition. Review and explanation with models, verbal and written materials for utilization of the food label.   Cardiac Rehab from 10/30/2016 in [facility] Cardiac and Pulmonary Rehab  Date  09/18/16  Educator  PI  Instruction Review Code  2- meets goals/outcomes      Exercise Physiology & Risk Factors: - Group verbal and written instruction with models to review the exercise physiology of the cardiovascular system and associated critical values. Details cardiovascular disease risk factors and the goals associated with each risk factor.   Cardiac Rehab from 10/30/2016 in [facility] Cardiac and Pulmonary Rehab  Date  09/25/16  Educator  [facility]  Instruction Review Code  2- meets goals/outcomes      Aerobic Exercise & Resistance Training: - Gives group verbal and written discussion on the health impact of inactivity. On the components of aerobic and resistive training programs and the benefits of this training and how to safely progress through these programs.   Cardiac Rehab from 10/30/2016 in [facility] Cardiac and Pulmonary Rehab  Date  09/27/16  Educator  AS  Instruction Review Code  2- meets goals/outcomes      Flexibility, Balance, General Exercise Guidelines: - Provides group verbal and written instruction on the benefits of flexibility and balance training programs. Provides general exercise guidelines with specific guidelines to those with heart or lung disease. Demonstration and skill practice provided.   Cardiac Rehab from 10/30/2016 in [facility] Cardiac and Pulmonary Rehab  Date  10/02/16  Educator  AS  Instruction Review Code  2- meets goals/outcomes      Stress Management: - Provides group verbal and written instruction about the health risks of elevated stress, cause of high stress, and healthy ways to reduce stress.   Cardiac Rehab from 10/30/2016 in [facility] Cardiac and Pulmonary Rehab  Date  10/11/16  Educator  [facility]  Instruction  Review Code  2- meets goals/outcomes      Depression: - Provides group verbal and written instruction on the correlation between heart/lung disease and depressed mood, treatment options, and the stigmas associated with seeking treatment.   Anatomy & Physiology of the Heart: - Group verbal and written instruction and models provide basic cardiac anatomy and physiology, with the coronary electrical and arterial systems. Review of: AMI, Angina, Valve disease, Heart Failure, Cardiac Arrhythmia, Pacemakers, and the ICD.   Cardiac Rehab from 10/30/2016 in [facility] Cardiac and Pulmonary Rehab  Date  10/09/16  Educator  [facility]  Instruction Review Code  2- meets goals/outcomes  Cardiac Procedures: - Group verbal and written instruction and models to describe the testing methods done to diagnose heart disease. Reviews the outcomes of the test results. Describes the treatment choices: Medical Management, Angioplasty, or Coronary Bypass Surgery.   Cardiac Rehab from 10/30/2016 in [facility] Cardiac and Pulmonary Rehab  Date  10/16/16  Educator  [facility]  Instruction Review Code  2- meets goals/outcomes      Cardiac Medications: - Group verbal and written instruction to review commonly prescribed medications for heart disease. Reviews the medication, class of the drug, and side effects. Includes the steps to properly store meds and maintain the prescription regimen.   Cardiac Rehab from 10/30/2016 in [facility] Cardiac and Pulmonary Rehab  Date  10/23/16 [Part 2 10/25/16]  Educator  MJA, RN [Part 2 Deanna Moran]  Instruction Review Code  2- meets goals/outcomes      Go Sex-Intimacy & Heart Disease, Get SMART - Goal Setting: - Group verbal and written instruction through game format to discuss heart disease and the return to sexual intimacy. Provides group verbal and written material to discuss and apply goal setting through the application of the S.M.A.R.T. Method.   Cardiac Rehab from 10/30/2016 in [facility] Cardiac and Pulmonary  Rehab  Date  10/16/16  Educator  [facility]  Instruction Review Code  2- meets goals/outcomes      Other Matters of the Heart: - Provides group verbal, written materials and models to describe Heart Failure, Angina, Valve Disease, and Diabetes in the realm of heart disease. Includes description of the disease process and treatment options available to the cardiac patient.   Cardiac Rehab from 10/30/2016 in [facility] Cardiac and Pulmonary Rehab  Date  10/09/16  Educator  [facility]  Instruction Review Code  2- meets goals/outcomes      Exercise & Equipment Safety: - Individual verbal instruction and demonstration of equipment use and safety with use of the equipment.   Cardiac Rehab from 10/30/2016 in [facility] Cardiac and Pulmonary Rehab  Date  08/03/16  Educator  [place]  Instruction Review Code  2- meets goals/outcomes      Infection Prevention: - Provides verbal and written material to individual with discussion of infection control including proper hand washing and proper equipment cleaning during exercise session.   Cardiac Rehab from 10/30/2016 in [facility] Cardiac and Pulmonary Rehab  Date  08/03/16  Educator  [place]  Instruction Review Code  2- meets goals/outcomes      Falls Prevention: - Provides verbal and written material to individual with discussion of falls prevention and safety.   Cardiac Rehab from 10/30/2016 in [facility] Cardiac and Pulmonary Rehab  Date  08/03/16  Educator  [place]  Instruction Review Code  2- meets goals/outcomes      Diabetes: - Individual verbal and written instruction to review signs/symptoms of diabetes, desired ranges of glucose level fasting, after meals and with exercise. Advice that pre and post exercise glucose checks will be done for 3 sessions at entry of program.    Knowledge Questionnaire Score:     Knowledge Questionnaire Score - 10/18/16 1808      Knowledge Questionnaire Score   Post Score 28/28      Core Components/Risk Factors/Patient Goals at Admission:      Personal Goals and Risk Factors at Admission - 08/23/16 1701      Core Components/Risk Factors/Patient Goals on Admission   Number of packs per day smoking 3-5 cigarettes/day. She said her son hands them out to her but is trying to help her  cut back.    Expected Outcomes Short Term: Will demonstrate readiness to quit, by selecting a quit date.;Long Term: Complete abstinence from all tobacco products for at least 12 months from quit date.   Intervention Provide education, individualized exercise plan and daily activity instruction to help decrease symptoms of SOB with activities of daily living.   Expected Outcomes Short Term: Achieves a reduction of symptoms when performing activities of daily living.      Core Components/Risk Factors/Patient Goals Review:      Goals and Risk Factor Review    Row Name 08/23/16 1700 08/23/16 1701 08/23/16 1702 10/18/16 1652       Core Components/Risk Factors/Patient Goals Review   Personal Goals Review Weight Management/Obesity;Tobacco Cessation  -  - Tobacco Cessation;Weight Management/Obesity    Review Deanna Moran reports that she is still smoking between 3-5 cigarettes/day. She reports she quit only 2 times in her life when she was pregnant. Deanna Moran said she is keeping a flow sheet and she and her child are checking their weight in the am and night and she is losing weight  Deanna Moran said she is eating breakfast now also.  weight today is 231lbs and she reports her clothes feel better.  Deanna Moran said her household is eating better since she is trying to eat better. Her blood pressure has been good.  Deanna Moran said she is still smoking 1/2pack cigarettes/day but her son is still limiting her. Deanna Moran said she has too much stress at work to cut back any more. She said her possibly quit date is May of 2019.225lbs today -Deanna Moran said she is proud of herself since she has lost 15lbs and lost 2 inches in her waist and walked a lot more during her walk. Deanna Moran  recently got to visit her son who is in the [employer] and she said that was a great visit.     Expected Outcomes  -  - Heart healthy lifestyle Hopefully to decrease her cigarette smoking. Cont to improve on her 6 minute walk like she did today.        Core Components/Risk Factors/Patient Goals at Discharge (Final Review):      Goals and Risk Factor Review - 10/18/16 1652      Core Components/Risk Factors/Patient Goals Review   Personal Goals Review Tobacco Cessation;Weight Management/Obesity   Review Deanna Moran said she is still smoking 1/2pack cigarettes/day but her son is still limiting her. Deanna Moran said she has too much stress at work to cut back any more. She said her possibly quit date is May of 2019.225lbs today -Deanna Moran said she is proud of herself since she has lost 15lbs and lost 2 inches in her waist and walked a lot more during her walk. Deanna Moran recently got to visit her son who is in the [employer] and she said that was a great visit.    Expected Outcomes Hopefully to decrease her cigarette smoking. Cont to improve on her 6 minute walk like she did today.       ITP Comments:     ITP Comments    Row Name 08/09/16 0932 08/23/16 1659 09/06/16 0613 09/12/16 1502 10/04/16 0701   ITP Comments 30 day review. Continue with ITP unless directed changes per Medical Director review New to program Deanna Moran reports that she is still smoking between 3-5 cigarettes/day. She reports she quit only 2 times in her life when she was pregnant. Deanna Moran said she is keeping a flow sheet and she and her child are checking their  weight in the am and night and she is losing weight.  30 day review. Continue with ITP unless directed changes per Medical Director review Out of town for vacation since last review. 30 day review. Continue with ITP unless directed changes per Medical Director review         Comments: Discharge ITP

## 2016-10-31 NOTE — Progress Notes (Signed)
Discharge Summary  Patient Details  Name: Deanna Moran MRN: [id number] Date of Birth: 04-30-1965 Referring Provider:     Cardiac Rehab from 08/03/2016 in [facility] Cardiac and Pulmonary Rehab  Referring Provider  End       Number of Visits: 36  Reason for Discharge:  Patient reached a stable level of exercise. Patient independent in their exercise.  Smoking History:  History  Smoking Status  . Current Every Day Smoker  . Packs/day: 0.50  . Years: 38.00  . Types: Cigarettes  Smokeless Tobacco  . Never Used    Comment: states she is smoking less than before her NSTEMI. Son is limiting her cigarettes and only giving her 5-10 a day depending on how stressed she is. She would like to quit but has tried multiple methods and has either bad interactions or unsuccessful.    Diagnosis:  NSTEMI (non-ST elevated myocardial infarction) ([place])  ADL UCSD:   Initial Exercise Prescription:     Initial Exercise Prescription - 08/03/16 1400      Date of Initial Exercise RX and Referring Provider   Date 08/03/16   Referring Provider End     Treadmill   MPH 2.3   Grade 1   Minutes 15   METs 3.08     Recumbant Bike   Level 2   RPM 60   Watts 40   Minutes 15   METs 3.14     NuStep   Level 3   SPM 80   Minutes 15   METs 3     REL-XR   Level 3   Speed 50   Minutes 15   METs 3     Prescription Details   Frequency (times per week) 3   Duration Progress to 30 minutes of continuous aerobic without signs/symptoms of physical distress     Intensity   THRR 40-80% of Max Heartrate 106-147   Ratings of Perceived Exertion 11-13     Resistance Training   Training Prescription Yes   Weight 3   Reps 10-15      Discharge Exercise Prescription (Final Exercise Prescription Changes):     Exercise Prescription Changes - 10/26/16 1000      Response to Exercise   Blood Pressure (Admit) 128/64   Blood Pressure (Exercise) 154/56   Blood Pressure (Exit) 122/62   Heart Rate  (Admit) 77 bpm   Heart Rate (Exercise) 121 bpm   Heart Rate (Exit) 66 bpm   Rating of Perceived Exertion (Exercise) 14   Symptoms none   Duration Continue with 45 min of aerobic exercise without signs/symptoms of physical distress.   Intensity THRR unchanged     Progression   Progression Continue to progress workloads to maintain intensity without signs/symptoms of physical distress.   Average METs 3.5     Resistance Training   Training Prescription Yes   Weight 3 lbs   Reps 10-15     Interval Training   Interval Training Yes   Equipment NuStep;REL-XR   Comments 2 min off 30sec on     Treadmill   MPH 3   Grade 0.5   Minutes 15   METs 3.5     NuStep   Level 5   Minutes 15   METs 3.2     REL-XR   Level 3   Minutes 15   METs 4     Home Exercise Plan   Plans to continue exercise at Home (comment)   Frequency Add 2 additional days to  program exercise sessions.   Initial Home Exercises Provided 08/21/16      Functional Capacity:     6 Minute Walk    Row Name 08/03/16 1441 10/19/16 1027       6 Minute Walk   Phase  - Discharge    Distance 1186 feet 1565 feet    Distance % Change  - 32 %  379 ft    Walk Time 6 minutes 6 minutes    # of Rest Breaks 0 0    MPH 2.24 2.96    METS 3.66 3.66    RPE 13 12    VO2 Peak 12.8 14.93    Symptoms Yes (comment) No    Comments back pain 5/10  -    Resting HR 65 bpm 75 bpm    Resting BP 148/78 128/64    Max Ex. HR 108 bpm 102 bpm    Max Ex. BP 164/80 148/70    2 Minute Post BP 128/68  -       Psychological, QOL, Others - Outcomes: PHQ 2/9: Depression screen [facility] 2/9 10/18/2016 08/03/2016  Decreased Interest 0 0  Down, Depressed, Hopeless 0 1  PHQ - 2 Score 0 1  Altered sleeping 0 0  Tired, decreased energy 0 1  Change in appetite 0 0  Feeling bad or failure about yourself  0 1  Trouble concentrating 0 0  Moving slowly or fidgety/restless 0 0  Suicidal thoughts 0 0  PHQ-9 Score 0 3  Difficult doing work/chores  Not difficult at all Somewhat difficult    Quality of Life:     Quality of Life - 10/18/16 1808      Quality of Life Scores   Health/Function Post 20.85 %   Socioeconomic Post 20.14 %   Psych/Spiritual Post 20.86 %   Family Post 21 %   GLOBAL Post 20.71 %      Personal Goals: Goals established at orientation with interventions provided to work toward goal.     Personal Goals and Risk Factors at Admission - 08/23/16 1701      Core Components/Risk Factors/Patient Goals on Admission   Number of packs per day smoking 3-5 cigarettes/day. She said her son hands them out to her but is trying to help her cut back.    Expected Outcomes Short Term: Will demonstrate readiness to quit, by selecting a quit date.;Long Term: Complete abstinence from all tobacco products for at least 12 months from quit date.   Intervention Provide education, individualized exercise plan and daily activity instruction to help decrease symptoms of SOB with activities of daily living.   Expected Outcomes Short Term: Achieves a reduction of symptoms when performing activities of daily living.       Personal Goals Discharge:     Goals and Risk Factor Review    Row Name 08/23/16 1700 08/23/16 1701 08/23/16 1702 10/18/16 1652       Core Components/Risk Factors/Patient Goals Review   Personal Goals Review Weight Management/Obesity;Tobacco Cessation  -  - Tobacco Cessation;Weight Management/Obesity    Review Deanna Moran reports that she is still smoking between 3-5 cigarettes/day. She reports she quit only 2 times in her life when she was pregnant. Deanna Moran said she is keeping a flow sheet and she and her child are checking their weight in the am and night and she is losing weight  Deanna Moran said she is eating breakfast now also.  weight today is 231lbs and she reports her clothes feel better.  Deanna Moran said her household is eating better since she is trying to eat better. Her blood pressure has been good.  Deanna Moran said she  is still smoking 1/2pack cigarettes/day but her son is still limiting her. Deanna Moran said she has too much stress at work to cut back any more. She said her possibly quit date is May of 2019.225lbs today -Deanna Moran said she is proud of herself since she has lost 15lbs and lost 2 inches in her waist and walked a lot more during her 31mnute walk. Deanna Moran recently got to visit her son who is in the [employer] she said that was a great visit.     Expected Outcomes  -  - Heart healthy lifestyle Hopefully to decrease her cigarette smoking. Cont to improve on her 6 minute walk like she did today.        Nutrition & Weight - Outcomes:     Pre Biometrics - 08/03/16 1441      Pre Biometrics   Height 5' 10.5" (1.791 m)   Weight 240 lb 9.6 oz (109.1 kg)   Waist Circumference 44 inches   Hip Circumference 47.5 inches   Waist to Hip Ratio 0.93 %   BMI (Calculated) 34.1   Single Leg Stand 26.57 seconds         Post Biometrics - 10/19/16 1026       Post  Biometrics   Height 5' 10.5" (1.791 m)   Weight 225 lb 3.2 oz (102.2 kg)   Waist Circumference 42 inches   Hip Circumference 46 inches   Waist to Hip Ratio 0.91 %   BMI (Calculated) 31.9   Single Leg Stand 14.56 seconds      Nutrition:     Nutrition Therapy & Goals - 10/18/16 1650      Nutrition Therapy   RD appointment defered Yes      Nutrition Discharge:     Nutrition Assessments - 08/03/16 1254      MEDFICTS Scores   Pre Score 29      Education Questionnaire Score:     Knowledge Questionnaire Score - 10/18/16 1808      Knowledge Questionnaire Score   Post Score 28/28      Goals reviewed with patient; copy given to patient.

## 2016-11-07 LAB — ALT: ALT: 23 IU/L (ref 0–32)

## 2016-11-20 ENCOUNTER — Other Ambulatory Visit: Payer: Self-pay | Admitting: Internal Medicine

## 2016-12-05 ENCOUNTER — Ambulatory Visit: Payer: Managed Care, Other (non HMO) | Admitting: Internal Medicine

## 2016-12-20 ENCOUNTER — Ambulatory Visit (INDEPENDENT_AMBULATORY_CARE_PROVIDER_SITE_OTHER): Payer: Managed Care, Other (non HMO) | Admitting: Internal Medicine

## 2016-12-20 ENCOUNTER — Encounter: Payer: Self-pay | Admitting: Internal Medicine

## 2016-12-20 VITALS — BP 120/77 | HR 65 | Ht 69.0 in | Wt 223.0 lb

## 2016-12-20 DIAGNOSIS — E785 Hyperlipidemia, unspecified: Secondary | ICD-10-CM

## 2016-12-20 DIAGNOSIS — I10 Essential (primary) hypertension: Secondary | ICD-10-CM | POA: Diagnosis not present

## 2016-12-20 DIAGNOSIS — I25118 Atherosclerotic heart disease of native coronary artery with other forms of angina pectoris: Secondary | ICD-10-CM

## 2016-12-20 DIAGNOSIS — I255 Ischemic cardiomyopathy: Secondary | ICD-10-CM

## 2016-12-20 DIAGNOSIS — Z720 Tobacco use: Secondary | ICD-10-CM | POA: Diagnosis not present

## 2016-12-20 MED ORDER — EZETIMIBE 10 MG PO TABS: 10.0000 mg | ORAL_TABLET | Freq: Every day | ORAL | 3 refills | Status: DC

## 2016-12-20 NOTE — Patient Instructions (Addendum)
Medication Instructions:  Your physician has recommended you make the following change in your medication:  1- START Zetia 10 mg (1 tablet) by mouth once a day.   Labwork: Your physician recommends that you return for lab work in: [place] will need to be FASTING. DO NOT EAT OR DRINK AFTER MIDNIGHT THE MORNING YOU GET THE LAB WORK. - AROUND WEEK OF January 7TH, 2019.   Testing/Procedures: none  Follow-Up: Your physician recommends that you schedule a follow-up appointment in: 3 MONTHS WITH DR END. MAKE SURE TO HAVE YOU LAB WORK DONE PRIOR TO appointment.  If you need a refill on your cardiac medications before your next appointment, please call your pharmacy.

## 2016-12-20 NOTE — Progress Notes (Signed)
Follow-up Outpatient Visit Date: 12/20/2016  Primary Care Provider: Patient, No Pcp Per No address on file  Chief Complaint: Follow-up coronary artery disease  HPI:  Deanna Moran is a 51 y.o. year-old female with history of coronary artery disease with NSTEMI in 06/2016 (managed medically), ischemic cardiomyopathy, hypertension, hyperlipidemia, and tobacco use, who presents for follow-up of coronary artery disease. I last saw her in June, at which time she was doing well. She has continued to do well, exercising frequently. She has lost about 20 pounds since the time of her MI. She noticed one episode of chest tightness when she tried to increase the incline on the treadmill at the gym. After resting for a few minutes, the pain dissipated. She has not had any further chest pain nor shortness of breath. She also denies palpitations, lightheadedness, edema, orthopnea, and PND. She continues to smoke 1/2 pack per day but is trying to cut back. Deanna Moran continues to exercise most days a week as well as watch what she eats. She remains compliant with her medications, including dual antiplatelet therapy with aspirin and clopidogrel. She has not noticed any adverse effects from her medications.  Health screening at her job ([employer]) revealed LDL of 99 in August.  --------------------------------------------------------------------------------------------------  Cardiovascular History & Procedures: Cardiovascular Problems:  Coronary artery disease status post NSTEMI  Ischemic cardiomyopathy  Risk Factors:  Known coronary artery disease, hypertension, hyperlipidemia, and tobacco use  Cath/PCI:  LHC (07/11/16): Significant single vessel CAD with subacute versus chronic total occlusion of the ostial LAD with left to left and right-to-left collaterals. Mild to moderate nonobstructive CAD involving the LCx and RCA. LVEF 30-35% with anterior and apical wall motion abnormality.  CV  Surgery:  None  EP Procedures and Devices:  None  Non-Invasive Evaluation(s):  TTE (07/12/16): Technically difficult study. LVEF 40-45% with anterior hypokinesis and grade 1 diastolic dysfunction. Mild MR. Mild left atrial enlargement. Normal RV size and function.  Recent CV Pertinent Labs: Lab Results  Component Value Date   CHOL 120 08/30/2016   HDL 32 (L) 08/30/2016   LDLCALC 74 08/30/2016   LDLDIRECT 76 08/30/2016   TRIG 68 08/30/2016   CHOLHDL 3.8 08/30/2016   CHOLHDL 4.6 07/11/2016   INR 1.00 07/10/2016   K 4.4 08/03/2016   BUN 10 08/03/2016   CREATININE 0.87 08/03/2016    Past medical and surgical history were reviewed and updated in EPIC.  Current Meds  Medication Sig  . aspirin 81 MG EC tablet Take 1 tablet (81 mg total) by mouth daily.  Marland Kitchen atorvastatin (LIPITOR) 80 MG tablet TAKE 1 TABLET BY MOUTH  DAILY AT 6 PM  . carvedilol (COREG) 3.125 MG tablet TAKE 1 TABLET BY MOUTH TWO  TIMES DAILY WITH A MEAL  . clopidogrel (PLAVIX) 75 MG tablet TAKE 1 TABLET BY MOUTH  DAILY WITH BREAKFAST  . naproxen sodium (ANAPROX) 220 MG tablet Take 220 mg by mouth as needed.  . nitroGLYCERIN (NITROSTAT) 0.4 MG SL tablet Place 1 tablet (0.4 mg total) under the tongue every 5 (five) minutes x 3 doses as needed for chest pain.  . prednisoLONE acetate (PRED FORTE) 1 % ophthalmic suspension Place 2 drops into both eyes 4 (four) times daily.    Allergies: Patient has no known allergies.  Social History   Social History  . Marital status: Divorced    Spouse name: N/A  . Number of children: N/A  . Years of education: N/A   Occupational History  . Not on file.  Social History Main Topics  . Smoking status: Current Every Day Smoker    Packs/day: 0.50    Years: 38.00    Types: Cigarettes  . Smokeless tobacco: Never Used     Comment: states she is smoking less than before her NSTEMI. Son is limiting her cigarettes and only giving her 5-10 a day depending on how stressed she is.  She would like to quit but has tried multiple methods and has either bad interactions or unsuccessful.  . Alcohol use No  . Drug use: Unknown  . Sexual activity: Not on file   Other Topics Concern  . Not on file   Social History Narrative  . No narrative on file    Family History  Problem Relation Age of Onset  . Heart attack Father   . Anuerysm Brother        brain    Review of Systems: A 12-system review of systems was performed and was negative except as noted in the HPI.  --------------------------------------------------------------------------------------------------  Physical Exam: BP 120/77 (BP Location: Left Arm, Patient Position: Sitting, Cuff Size: Normal)   Pulse 65   Ht 5' 9"  (1.753 m)   Wt 223 lb (101.2 kg)   BMI 32.93 kg/m   General:  Obese woman, seated comfortably in the exam room. HEENT: No conjunctival pallor or scleral icterus. Moist mucous membranes.  OP clear. Neck: Supple without lymphadenopathy, thyromegaly, JVD, or HJR. No carotid bruit. Lungs: Normal work of breathing. Clear to auscultation bilaterally without wheezes or crackles. Heart: Regular rate and rhythm without murmurs, rubs, or gallops. Non-displaced PMI. Abd: Bowel sounds present. Soft, NT/ND without hepatosplenomegaly Ext: No lower extremity edema. Radial, PT, and DP pulses are 2+ bilaterally. Skin: Warm and dry without rash.  EKG:  Normal sinus rhythm with left posterior fascicular block and incomplete right bundle branch block. Poor R-wave progression in V1 through V3 consistent with prior anteroseptal MI.  Lab Results  Component Value Date   WBC 6.3 08/30/2016   HGB 12.7 08/30/2016   HCT 38.6 08/30/2016   MCV 87 08/30/2016   PLT 245 08/30/2016    Lab Results  Component Value Date   NA 142 08/03/2016   K 4.4 08/03/2016   CL 101 08/03/2016   CO2 24 08/03/2016   BUN 10 08/03/2016   CREATININE 0.87 08/03/2016   GLUCOSE 96 08/03/2016   ALT 23 11/06/2016    Lab Results   Component Value Date   CHOL 120 08/30/2016   HDL 32 (L) 08/30/2016   LDLCALC 74 08/30/2016   LDLDIRECT 76 08/30/2016   TRIG 68 08/30/2016   CHOLHDL 3.8 08/30/2016   LDL 99 10/2016  --------------------------------------------------------------------------------------------------  ASSESSMENT AND PLAN: Coronary artery disease with stable angina Overall, Deanna Moran is recovering well from her NSTEMI with subacute versus chronic occlusion of the LAD. She notes only one episode of exertional chest pain when pushing herself at the gym. We will continue her current medications, including dual antiplatelet therapy for 12 months and carvedilol. I encouraged her to exercise, as she has been doing.  Ischemic cardiomyopathy Deanna Moran appears euvolemic and well compensated on exam today. We will continue with current doses of carvedilol and losartan. I am hesitant to up titrate these further at the current time due to her blood pressure and heart rate.  Hyperlipidemia LDL on her last check in the office in mid June and was just above goal at 57. Labs obtained in August at her place of work showed an LDL  of 99, despite being on atorvastatin 80 mg daily. We have discussed further treatment options, including lifestyle modifications, ezetimibe, and PCS K9 inhibitor. We have agreed to continue with exercise and weight loss as well as add ezetimibe 10 mg daily. We will plan to repeat a lipid panel and ALT in about 3 months.  Hypertension Blood pressure is well controlled today. No medication changes.  Tobacco abuse We discussed the importance of smoking cessation. Deanna Moran is interested in quitting but is concerned about her success, given stress in her life. We discussed pharmacologic agents, including nicotine replacement, bupropion, and Chantix. She is not interested in any medication at the current time.  Follow-up: Return to clinic in 3 months.  Nelva Bush, MD 12/21/2016 7:15 AM

## 2016-12-21 DIAGNOSIS — I255 Ischemic cardiomyopathy: Secondary | ICD-10-CM | POA: Insufficient documentation

## 2016-12-21 DIAGNOSIS — I2510 Atherosclerotic heart disease of native coronary artery without angina pectoris: Secondary | ICD-10-CM | POA: Insufficient documentation

## 2017-01-03 ENCOUNTER — Other Ambulatory Visit: Payer: Self-pay | Admitting: Internal Medicine

## 2017-01-03 NOTE — Telephone Encounter (Signed)
Please advise if ok to refill. 

## 2017-01-04 ENCOUNTER — Other Ambulatory Visit: Payer: Self-pay

## 2017-01-04 MED ORDER — ASPIRIN 81 MG PO TBEC: 81.0000 mg | DELAYED_RELEASE_TABLET | Freq: Every day | ORAL | 3 refills | Status: DC

## 2017-02-20 ENCOUNTER — Other Ambulatory Visit: Payer: Self-pay | Admitting: Internal Medicine

## 2017-04-24 NOTE — Progress Notes (Signed)
Follow-up Outpatient Visit Date: 04/25/2017  Primary Care Provider: Patient, No Pcp Per No address on file  Chief Complaint: Follow-up coronary artery disease and ischemic cardiomyopathy  HPI:  Deanna Moran is a 52 y.o. year-old female with history of coronary artery disease with NSTEMI in 06/2016 (managed medically), ischemic cardiomyopathy, hypertension, hyperlipidemia, and tobacco use, who presents for follow-up of coronary artery disease and ischemic cardiomyopathy. I last saw Deanna Moran in early October, at which time she was doing well. She reported a single episode of chest pain while walking on the treadmill, though this promptly resolved after resting for a few minutes. Due to suboptimally controlled lipids (LDL 99 in August), we agreed to add ezetimibe to her statin.  Today, Deanna Moran reports doing well.  She denies chest pain, shortness of breath, palpitations, lightheadedness, orthopnea, and PND.  She has chronic calf edema when seated at work for several hours at a time.  This has been stable for years.  She continues to lose weight, predominantly through diet.  She is no longer exercising regularly due to increased obligations at home and at work.  She remains compliant with her medications and is tolerating these well.  She has noted occasional blood-tinged mucus in her nose but denies frank epistaxis or other bleeding.  She recently had labs done at [facility] but noted that only an ALT was performed (we had intended for an ALT and a lipid panel to be drawn).  --------------------------------------------------------------------------------------------------  Cardiovascular History & Procedures: Cardiovascular Problems:  Coronary artery disease status post NSTEMI  Ischemic cardiomyopathy  Risk Factors:  Known coronary artery disease, hypertension, hyperlipidemia, and tobacco use  Cath/PCI:  LHC (07/11/16): Significant single vessel CAD with subacute versus chronic total  occlusion of the ostial LAD with left to left and right-to-left collaterals. Mild to moderate nonobstructive CAD involving the LCx and RCA. LVEF 30-35% with anterior and apical wall motion abnormality.  CV Surgery:  None  EP Procedures and Devices:  None  Non-Invasive Evaluation(s):  TTE (07/12/16): Technically difficult study. LVEF 40-45% with anterior hypokinesis and grade 1 diastolic dysfunction. Mild MR. Mild left atrial enlargement. Normal RV size and function.  Recent CV Pertinent Labs: Lab Results  Component Value Date   CHOL 120 08/30/2016   HDL 32 (L) 08/30/2016   LDLCALC 74 08/30/2016   LDLDIRECT 76 08/30/2016   TRIG 68 08/30/2016   CHOLHDL 3.8 08/30/2016   CHOLHDL 4.6 07/11/2016   INR 1.00 07/10/2016   K 4.4 08/03/2016   BUN 10 08/03/2016   CREATININE 0.87 08/03/2016    Past medical and surgical history were reviewed and updated in EPIC.  Current Meds  Medication Sig  . aspirin 81 MG EC tablet Take 1 tablet (81 mg total) by mouth daily.  Marland Kitchen atorvastatin (LIPITOR) 80 MG tablet TAKE 1 TABLET BY MOUTH  DAILY AT 6 PM  . carvedilol (COREG) 3.125 MG tablet TAKE 1 TABLET BY MOUTH TWO  TIMES DAILY WITH A MEAL  . clopidogrel (PLAVIX) 75 MG tablet TAKE 1 TABLET BY MOUTH  DAILY WITH BREAKFAST  . ezetimibe (ZETIA) 10 MG tablet Take 1 tablet (10 mg total) by mouth daily.  Marland Kitchen losartan (COZAAR) 25 MG tablet Take 1 tablet (25 mg total) by mouth daily.  . naproxen sodium (ANAPROX) 220 MG tablet Take 220 mg by mouth as needed.  . nitroGLYCERIN (NITROSTAT) 0.4 MG SL tablet Place 1 tablet (0.4 mg total) under the tongue every 5 (five) minutes x 3 doses as needed for chest  pain.  . prednisoLONE acetate (PRED FORTE) 1 % ophthalmic suspension Place 2 drops into both eyes 4 (four) times daily.    Allergies: Patient has no known allergies.  Social History   Socioeconomic History  . Marital status: Divorced    Spouse name: Not on file  . Number of children: Not on file  . Years  of education: Not on file  . Highest education level: Not on file  Social Needs  . Financial resource strain: Not on file  . Food insecurity - worry: Not on file  . Food insecurity - inability: Not on file  . Transportation needs - medical: Not on file  . Transportation needs - non-medical: Not on file  Occupational History  . Not on file  Tobacco Use  . Smoking status: Current Every Day Smoker    Packs/day: 0.25    Years: 38.00    Pack years: 9.50    Types: Cigarettes  . Smokeless tobacco: Never Used  Substance and Sexual Activity  . Alcohol use: No  . Drug use: Not on file  . Sexual activity: Not on file  Other Topics Concern  . Not on file  Social History Narrative  . Not on file    Family History  Problem Relation Age of Onset  . Heart attack Father   . Anuerysm Brother        brain    Review of Systems: A 12-system review of systems was performed and was negative except as noted in the HPI.  --------------------------------------------------------------------------------------------------  Physical Exam: BP 126/64 (BP Location: Left Arm, Patient Position: Sitting, Cuff Size: Normal)   Pulse 65   Ht 5' 10"  (1.778 m)   Wt 219 lb 12 oz (99.7 kg)   BMI 31.53 kg/m   General: Obese woman, seated comfortably in the exam room. HEENT: No conjunctival pallor or scleral icterus. Moist mucous membranes.  OP clear. Neck: Supple without lymphadenopathy, thyromegaly, JVD, or HJR. Lungs: Normal work of breathing. Clear to auscultation bilaterally without wheezes or crackles. Heart: Regular rate and rhythm without murmurs, rubs, or gallops. Non-displaced PMI. Abd: Bowel sounds present. Soft, NT/ND without hepatosplenomegaly Ext: No lower extremity edema. Radial, PT, and DP pulses are 2+ bilaterally. Skin: Warm and dry without rash.  EKG: Normal sinus rhythm with rightward axis and poor R wave progression.  Lab Results  Component Value Date   WBC 6.3 08/30/2016   HGB  12.7 08/30/2016   HCT 38.6 08/30/2016   MCV 87 08/30/2016   PLT 245 08/30/2016    Lab Results  Component Value Date   NA 142 08/03/2016   K 4.4 08/03/2016   CL 101 08/03/2016   CO2 24 08/03/2016   BUN 10 08/03/2016   CREATININE 0.87 08/03/2016   GLUCOSE 96 08/03/2016   ALT 23 11/06/2016    Lab Results  Component Value Date   CHOL 120 08/30/2016   HDL 32 (L) 08/30/2016   LDLCALC 74 08/30/2016   LDLDIRECT 76 08/30/2016   TRIG 68 08/30/2016   CHOLHDL 3.8 08/30/2016   Outside labs (04/12/17): ALT 23 Lipid panel not drawn.  --------------------------------------------------------------------------------------------------  ASSESSMENT AND PLAN: Coronary artery disease without angina Deanna Moran continues to do well following her NSTEMI with subacute occlusion of the LAD in 06/2016.  I will continue her current medication regimen, including dual antiplatelet therapy with aspirin and clopidogrel.  I anticipate discontinuing clopidogrel after 12 months of therapy.  We will continue aggressive secondary prevention.  Chronic systolic heart failure secondary  to ischemic cardiomyopathy Deanna Moran appears euvolemic and well compensated with NYHA class II symptoms.  We will continue current doses of carvedilol and losartan.  Hyperlipidemia Goal LDL less than 70; it was most recently 99 when checked in August at lab [facility].  I have provided Deanna Moran with an order for repeat fasting lipid panel to be done by [facility] at her convenience.  We will continue atorvastatin 80 mg daily and ezetimibe.  I congratulated Deanna Moran on her continued weight loss.  Hypertension Blood pressure well controlled today.  No medication changes.  Tobacco abuse Unfortunately, Deanna Moran continues to smoke, though she has cut down to 0.25-0.5 packs/day.  I encouraged her to cut down further with the goal of ultimately quitting.  She has previously declined pharmacotherapy to help her stop  smoking.  Follow-up: Return to clinic in 6 months.  Nelva Bush, MD 04/25/2017 3:02 PM

## 2017-04-25 ENCOUNTER — Ambulatory Visit: Payer: Managed Care, Other (non HMO) | Admitting: Internal Medicine

## 2017-04-25 ENCOUNTER — Encounter: Payer: Self-pay | Admitting: Internal Medicine

## 2017-04-25 VITALS — BP 126/64 | HR 65 | Ht 70.0 in | Wt 219.8 lb

## 2017-04-25 DIAGNOSIS — I5022 Chronic systolic (congestive) heart failure: Secondary | ICD-10-CM | POA: Diagnosis not present

## 2017-04-25 DIAGNOSIS — I5042 Chronic combined systolic (congestive) and diastolic (congestive) heart failure: Secondary | ICD-10-CM | POA: Insufficient documentation

## 2017-04-25 DIAGNOSIS — I10 Essential (primary) hypertension: Secondary | ICD-10-CM | POA: Diagnosis not present

## 2017-04-25 DIAGNOSIS — Z720 Tobacco use: Secondary | ICD-10-CM

## 2017-04-25 DIAGNOSIS — E785 Hyperlipidemia, unspecified: Secondary | ICD-10-CM

## 2017-04-25 DIAGNOSIS — I2510 Atherosclerotic heart disease of native coronary artery without angina pectoris: Secondary | ICD-10-CM | POA: Diagnosis not present

## 2017-04-25 DIAGNOSIS — I255 Ischemic cardiomyopathy: Secondary | ICD-10-CM

## 2017-04-25 MED ORDER — CARVEDILOL 3.125 MG PO TABS: ORAL_TABLET | ORAL | 3 refills | Status: DC

## 2017-04-25 MED ORDER — LOSARTAN POTASSIUM 25 MG PO TABS: 25.0000 mg | ORAL_TABLET | Freq: Every day | ORAL | 3 refills | Status: DC

## 2017-04-25 MED ORDER — ATORVASTATIN CALCIUM 80 MG PO TABS: ORAL_TABLET | ORAL | 3 refills | Status: DC

## 2017-04-25 MED ORDER — CLOPIDOGREL BISULFATE 75 MG PO TABS: 75.0000 mg | ORAL_TABLET | Freq: Every day | ORAL | 3 refills | Status: DC

## 2017-04-25 MED ORDER — ASPIRIN 81 MG PO TBEC: 81.0000 mg | DELAYED_RELEASE_TABLET | Freq: Every day | ORAL | 3 refills | Status: DC

## 2017-04-25 NOTE — Patient Instructions (Addendum)
Medication Instructions:  Your physician recommends that you continue on your current medications as directed. Please refer to the Current Medication list given to you today.   Labwork: Your physician recommends that you return for lab work in: at your earliest convenience. - You will need to be fasting. - Please fax results to [phone].   Testing/Procedures: none   Follow-Up: Your physician wants you to follow-up in: 6 MONTHS WITH DR END. You will receive a reminder letter in the mail two months in advance. If you don't receive a letter, please call our office to schedule the follow-up appointment.    If you need a refill on your cardiac medications before your next appointment, please call your pharmacy.

## 2017-05-07 ENCOUNTER — Other Ambulatory Visit: Payer: Self-pay | Admitting: Internal Medicine

## 2017-05-08 LAB — LIPID PANEL
CHOL/HDL RATIO: 3 ratio (ref 0.0–4.4)
Cholesterol, Total: 114 mg/dL (ref 100–199)
HDL: 38 mg/dL — ABNORMAL LOW (ref >39)
LDL CALC: 62 mg/dL (ref 0–99)
Triglycerides: 72 mg/dL (ref 0–149)
VLDL CHOLESTEROL CAL: 14 mg/dL (ref 5–40)

## 2017-06-04 ENCOUNTER — Telehealth: Payer: Self-pay | Admitting: Internal Medicine

## 2017-06-04 NOTE — Telephone Encounter (Signed)
I called and spoke with the patient regarding a stand up desk in relation to her swelling. I advised her that a stand up desk may be of some benefit to her, but if she is sitting/ standing all day, then she will be more prone to swelling in her lower extremities. However, I explained that the stand up desk may be a better option than sitting all day. She states that she may be faxing some paperwork over from "the [employer]" to help facilitate getting a standup desk approved for her. I advised her I will forward to Dr. Saunders Revel and his nurse, Anderson Malta to make them aware of this. She is agreeable.

## 2017-06-04 NOTE — Telephone Encounter (Signed)
Pt calling asking since she is having swelling in her feet would a stand up desk help at all   Would like a call back  Please advise

## 2017-06-05 NOTE — Telephone Encounter (Signed)
I am not sure that a standing desk will necessarily help her leg swelling.  Walking, leg elevation, sodium restriction, and compression stockings would likely be most effective.  However, I am happy to review the paperwork to facilitate approval of a standing desk.  Nelva Bush, MD [facility] Pager: [phone]

## 2017-06-06 NOTE — Telephone Encounter (Signed)
Deanna Moran,   Just an FYI, in case you see any paperwork for her.   Thanks!

## 2017-06-21 NOTE — Telephone Encounter (Signed)
S/w patient. She had a family emergency come up and has been unable to get any paperwork yet. She verbalized understanding of Dr Darnelle Bos recommendations and will let us know in the future if there's anything we can do.

## 2017-10-18 ENCOUNTER — Telehealth: Payer: Self-pay | Admitting: Internal Medicine

## 2017-10-18 DIAGNOSIS — I255 Ischemic cardiomyopathy: Secondary | ICD-10-CM

## 2017-10-18 DIAGNOSIS — E785 Hyperlipidemia, unspecified: Secondary | ICD-10-CM

## 2017-10-18 DIAGNOSIS — I2510 Atherosclerotic heart disease of native coronary artery without angina pectoris: Secondary | ICD-10-CM

## 2017-10-18 DIAGNOSIS — I10 Essential (primary) hypertension: Secondary | ICD-10-CM

## 2017-10-18 NOTE — Telephone Encounter (Signed)
Pt states she is having blood work done at her place of employment, [facility], and asks if she can come by and pick up an order for blood work [employer], [place] may order at her 9/9 appointment. Please call and advise.

## 2017-10-18 NOTE — Telephone Encounter (Signed)
Please advise 

## 2017-10-18 NOTE — Telephone Encounter (Signed)
No answer. Left message that lab slips will be at front desk for her to pick up at her earliest convenience and to call back if she has any further questions.

## 2017-10-18 NOTE — Telephone Encounter (Signed)
Please check a fasting CMET, lipid, and CBC.  Dx: CAD, ICM, HLD, HTN

## 2017-10-20 ENCOUNTER — Other Ambulatory Visit: Payer: Self-pay | Admitting: Internal Medicine

## 2017-10-23 ENCOUNTER — Other Ambulatory Visit: Payer: Self-pay | Admitting: Internal Medicine

## 2017-10-23 ENCOUNTER — Telehealth: Payer: Self-pay | Admitting: Internal Medicine

## 2017-10-23 ENCOUNTER — Other Ambulatory Visit: Payer: Self-pay

## 2017-10-23 MED ORDER — NITROGLYCERIN 0.4 MG SL SUBL: SUBLINGUAL_TABLET | SUBLINGUAL | 1 refills | Status: DC

## 2017-10-23 NOTE — Telephone Encounter (Signed)
S/w pharmacist at [facility]. He wanted to clarify if ok to put instructions on Nitro as 1 tablet under the tongue every 5 minutes for maximum of 3 doses and to call 911 if chest pain persists. I agreed and gave permission for ok for 2 refills. Nothing further needed.

## 2017-10-23 NOTE — Telephone Encounter (Signed)
Pharmacy [facility] needing a call back to clarify Nitro instructions    Please call back

## 2017-11-03 LAB — CBC WITH DIFFERENTIAL/PLATELET
BASOS ABS: 0 10*3/uL (ref 0.0–0.2)
Basos: 0 %
EOS (ABSOLUTE): 0.1 10*3/uL (ref 0.0–0.4)
EOS: 2 %
HEMATOCRIT: 40.8 % (ref 34.0–46.6)
Hemoglobin: 13.5 g/dL (ref 11.1–15.9)
IMMATURE GRANS (ABS): 0 10*3/uL (ref 0.0–0.1)
Immature Granulocytes: 0 %
LYMPHS: 33 %
Lymphocytes Absolute: 1.9 10*3/uL (ref 0.7–3.1)
MCH: 29 pg (ref 26.6–33.0)
MCHC: 33.1 g/dL (ref 31.5–35.7)
MCV: 88 fL (ref 79–97)
Monocytes Absolute: 0.4 10*3/uL (ref 0.1–0.9)
Monocytes: 6 %
NEUTROS ABS: 3.3 10*3/uL (ref 1.4–7.0)
Neutrophils: 59 %
Platelets: 232 10*3/uL (ref 150–450)
RBC: 4.66 x10E6/uL (ref 3.77–5.28)
RDW: 13.5 % (ref 12.3–15.4)
WBC: 5.7 10*3/uL (ref 3.4–10.8)

## 2017-11-03 LAB — COMPREHENSIVE METABOLIC PANEL
A/G RATIO: 1.9 (ref 1.2–2.2)
ALK PHOS: 95 IU/L (ref 39–117)
ALT: 21 IU/L (ref 0–32)
AST: 14 IU/L (ref 0–40)
Albumin: 4.2 g/dL (ref 3.5–5.5)
BILIRUBIN TOTAL: 0.6 mg/dL (ref 0.0–1.2)
BUN/Creatinine Ratio: 16 (ref 9–23)
BUN: 13 mg/dL (ref 6–24)
CHLORIDE: 104 mmol/L (ref 96–106)
CO2: 25 mmol/L (ref 20–29)
Calcium: 9.1 mg/dL (ref 8.7–10.2)
Creatinine, Ser: 0.82 mg/dL (ref 0.57–1.00)
GFR calc non Af Amer: 82 mL/min/{1.73_m2} (ref >59)
GFR, EST AFRICAN AMERICAN: 94 mL/min/{1.73_m2} (ref >59)
GLUCOSE: 105 mg/dL — AB (ref 65–99)
Globulin, Total: 2.2 g/dL (ref 1.5–4.5)
POTASSIUM: 4.4 mmol/L (ref 3.5–5.2)
Sodium: 142 mmol/L (ref 134–144)
TOTAL PROTEIN: 6.4 g/dL (ref 6.0–8.5)

## 2017-11-03 LAB — LIPID PANEL
CHOLESTEROL TOTAL: 123 mg/dL (ref 100–199)
Chol/HDL Ratio: 3.4 ratio (ref 0.0–4.4)
HDL: 36 mg/dL — AB (ref >39)
LDL Calculated: 77 mg/dL (ref 0–99)
Triglycerides: 50 mg/dL (ref 0–149)
VLDL Cholesterol Cal: 10 mg/dL (ref 5–40)

## 2017-11-13 ENCOUNTER — Other Ambulatory Visit: Payer: Self-pay | Admitting: Internal Medicine

## 2017-11-23 ENCOUNTER — Encounter: Payer: Self-pay | Admitting: Physician Assistant

## 2017-11-23 NOTE — Progress Notes (Signed)
Cardiology Office Note Date:  11/26/2017  Patient ID:  Deanna, Moran 1965/08/06, MRN [id number] PCP:  Patient, No Pcp Per  Cardiologist:  Dr. Saunders Revel, MD    Chief Complaint: Follow up  History of Present Illness: Deanna Moran is a 52 y.o. female with history of CAD with a NSTEMI in 06/2016 that was medically managed, HFrEF secondary to ICM, HTN, HLD, and tobacco abuse who presents for follow up of her CAD and ICM.   Patient was admitted to the hospital in 06/2016 with a NSTEMI. Troponin peaked at 10.78. She underwent LHC on 07/11/2016 that showed significant one-vessel CAD with subacute vs chronic total occlusion of the ostial LAD with left-to-left and right-to-left collaterals. There was moderate nonobstructive CAD involving the LCx and RCA. LVEF was 30-35% with anterior and apical wall motion abnormality. Echo on 07/12/2016 showed an LVEF of 40-45%, anterior hypokinesis, Gr1DD, mild MR, mild LAE, normal RV size and systolic function. She was last seen in the office in 04/2017 and was doing well. She has chronic lower extremity edema that is worse when she is sitting for prolonged periods that has been stable.  Labs: 10/2017: LDL 77,  LFT normal, SCr 0.82, K+ 4.4, CBC unremarkable.   She comes in doing well from a cardiac perspective today. She has not had any symptoms concerning for angina since she ws last seen. She indicates she is "pleased just to maintain my current status." In referencing this, she indicates she she was last seen, she has been under increased stress at home with the loss of her aunt as well as they death-date and birth date anniversaries of her mother. She is also under increased financial stress as she is no longer receiving child support. She has been compliant with all medications. She is attempting to eat a heart healthy diet. With the stress as noted above, she did briefly increase her tobacco abuse to 1.5 packs daily, though is now back down to 1 pack daily. She denies  any orthopnea, lower extremity swelling, abdominal distension, or early satiety. She does mention some aching in her lower extremities with prolonged ambulation. She also notes some bilateral paresthesias affecting her feet following a pedicure.   Past Medical History:  Diagnosis Date  . Chronic back pain   . Coronary artery disease    a. NSTEMI 4/18; b. LHC 4/18: LM nl, ost LAD 100% with L-L & R-L collats, LCx nl, OM3 40%, RCA with mild irregs  . HLD (hyperlipidemia)   . HTN (hypertension)   . Ischemic cardiomyopathy    a. echo 4/18: EF 40-45%, ant HK, Gr1DD, mild MR, mild LAE, nl RVSF  . NSTEMI (non-ST elevated myocardial infarction) ([place]) 06/2016  . Obesity     Past Surgical History:  Procedure Laterality Date  . LEFT HEART CATH AND CORONARY ANGIOGRAPHY N/A 07/11/2016   Procedure: Left Heart Cath and Coronary Angiography;  Surgeon: Nelva Bush, MD;  Location: [facility];  Service: Cardiovascular;  Laterality: N/A;  . TUBAL LIGATION      Current Meds  Medication Sig  . aspirin 81 MG EC tablet Take 1 tablet (81 mg total) by mouth daily.  . carvedilol (COREG) 3.125 MG tablet TAKE 1 TABLET BY MOUTH TWO  TIMES DAILY WITH A MEAL  . clopidogrel (PLAVIX) 75 MG tablet Take 1 tablet (75 mg total) by mouth daily with breakfast.  . ezetimibe (ZETIA) 10 MG tablet TAKE 1 TABLET BY MOUTH  DAILY  . naproxen sodium (ANAPROX) 220  MG tablet Take 220 mg by mouth as needed.  . nitroGLYCERIN (NITROSTAT) 0.4 MG SL tablet SEE DIRECTIONS ON  MEDICATION INFORMATION  SHEET WITH INVOICE  PAPERWORK  . prednisoLONE acetate (PRED FORTE) 1 % ophthalmic suspension Place 2 drops into both eyes 4 (four) times daily.  . [DISCONTINUED] atorvastatin (LIPITOR) 80 MG tablet TAKE 1 TABLET BY MOUTH  DAILY AT 6 PM  . [DISCONTINUED] losartan (COZAAR) 25 MG tablet Take 1 tablet (25 mg total) by mouth daily.    Allergies:   Patient has no known allergies.   Social History:  The patient  reports that she has  been smoking cigarettes. She has a 19.00 pack-year smoking history. She has never used smokeless tobacco. She reports that she does not drink alcohol.   Family History:  The patient's family history includes Anuerysm in her brother; Heart attack in her father.  ROS:   Review of Systems  Constitutional: Positive for malaise/fatigue. Negative for chills, diaphoresis, fever and weight loss.  HENT: Negative for congestion.   Eyes: Negative for discharge and redness.  Respiratory: Negative for cough, hemoptysis, sputum production, shortness of breath and wheezing.   Cardiovascular: Positive for claudication. Negative for chest pain, palpitations, orthopnea, leg swelling and PND.  Gastrointestinal: Negative for abdominal pain, blood in stool, heartburn, melena, nausea and vomiting.  Genitourinary: Negative for hematuria.  Musculoskeletal: Negative for falls and myalgias.  Skin: Negative for rash.  Neurological: Negative for dizziness, tingling, tremors, sensory change, speech change, focal weakness, loss of consciousness and weakness.  Endo/Heme/Allergies: Does not bruise/bleed easily.  Psychiatric/Behavioral: Negative for substance abuse. The patient is not nervous/anxious.   All other systems reviewed and are negative.    PHYSICAL EXAM:  VS:  BP 102/70 (BP Location: Left Arm, Patient Position: Sitting, Cuff Size: Large)   Pulse (!) 56   Ht 5' 10"  (1.778 m)   Wt 221 lb 4 oz (100.4 kg)   BMI 31.75 kg/m  BMI: Body mass index is 31.75 kg/m.  Physical Exam  Constitutional: She is oriented to person, place, and time. She appears well-developed and well-nourished.  HENT:  Head: Normocephalic and atraumatic.  Eyes: Right eye exhibits no discharge. Left eye exhibits no discharge.  Neck: Normal range of motion. No JVD present.  Cardiovascular: Normal rate, regular rhythm, S1 normal, S2 normal and normal heart sounds. Exam reveals no distant heart sounds, no friction rub, no midsystolic click  and no opening snap.  No murmur heard. Pulses:      Dorsalis pedis pulses are 1+ on the right side, and 1+ on the left side.       Posterior tibial pulses are 1+ on the right side, and 1+ on the left side.  Pulmonary/Chest: Effort normal and breath sounds normal. No respiratory distress. She has no decreased breath sounds. She has no wheezes. She has no rales. She exhibits no tenderness.  Abdominal: Soft. She exhibits no distension. There is no tenderness.  Musculoskeletal: She exhibits no edema.  Neurological: She is alert and oriented to person, place, and time.  Skin: Skin is warm and dry. No cyanosis. Nails show no clubbing.  Psychiatric: She has a normal mood and affect. Her speech is normal and behavior is normal. Judgment and thought content normal.      EKG:  Was ordered and interpreted by me today. Shows sinus bradycardia, 56 bpm, right axis deviation, prior septal infarct, poor R wave progression, TWI leads I and aVL (no significant change when compared to prior study)  Recent Labs: 11/02/2017: ALT 21; BUN 13; Creatinine, Ser 0.82; Hemoglobin 13.5; Platelets 232; Potassium 4.4; Sodium 142  11/02/2017: Chol/HDL Ratio 3.4; Cholesterol, Total 123; HDL 36; LDL Calculated 77; Triglycerides 50   CrCl cannot be calculated (Patient's most recent lab result is older than the maximum 21 days allowed.).   Wt Readings from Last 3 Encounters:  11/26/17 221 lb 4 oz (100.4 kg)  04/25/17 219 lb 12 oz (99.7 kg)  12/20/16 223 lb (101.2 kg)     Other studies reviewed: Additional studies/records reviewed today include: summarized above  ASSESSMENT AND PLAN:  1. CAD involving the native coronary arteries without angina: She continues to do well without any symptoms concerning for angina. Continue Coreg 3.125 mg bid and ASA. She is now greater than 12 months out from her NSTEMI. I will discuss with her primary cardiologist on 9/10 if we are able to stop her Plavix and maintain her on ASA  monotherapy moving forward. Our office will call her with this information. Continue lifestyle modification and aggressive secondary prevention.   2. HFrEF secondary to ICM: She does not appear grossly volume up at this time. She has stable NYHA class II symptoms. Continue current dose of Coreg and reduced dose of losartan as detailed below.   3. HLD: LDL of 77 from 10/2017. Goal LDL < 70. Change Lipitor to Crestor 40 mg daily. Continue Zetia 10 mg daily. Recheck fasting lipid and liver function in 12 weeks. If her LDL is not at goal at that time, refer to lipid clinic for PCSK-9 inhibitor.   4. HTN: Blood pressure is relatively soft today. Decrease losartan to 12.5 mg daily. Continue Coreg 3.125 mg bid.   5. Tobacco abuse: Completion cessation is advised.   6. Obesity: Weight loss is advised.   7. Claudication vs neuropathy: Schedule lower extremity arterial ultrasound and ABIs. Optimal BP/HR/lipid control. If vascular workup is unrevealing, she will need to follow up with her PCP for further evaluation.   Disposition: F/u with Dr. Saunders Revel or an APP in 3-4 months.   Current medicines are reviewed at length with the patient today.  The patient did not have any concerns regarding medicines.  Signed, Christell Faith, PA-C 11/26/2017 2:10 PM     [address], [place] [address] [phone]

## 2017-11-26 ENCOUNTER — Encounter: Payer: Self-pay | Admitting: Physician Assistant

## 2017-11-26 ENCOUNTER — Ambulatory Visit: Payer: Managed Care, Other (non HMO) | Admitting: Physician Assistant

## 2017-11-26 VITALS — BP 102/70 | HR 56 | Ht 70.0 in | Wt 221.2 lb

## 2017-11-26 DIAGNOSIS — I5022 Chronic systolic (congestive) heart failure: Secondary | ICD-10-CM | POA: Diagnosis not present

## 2017-11-26 DIAGNOSIS — I10 Essential (primary) hypertension: Secondary | ICD-10-CM | POA: Diagnosis not present

## 2017-11-26 DIAGNOSIS — E785 Hyperlipidemia, unspecified: Secondary | ICD-10-CM

## 2017-11-26 DIAGNOSIS — Z720 Tobacco use: Secondary | ICD-10-CM

## 2017-11-26 DIAGNOSIS — I739 Peripheral vascular disease, unspecified: Secondary | ICD-10-CM

## 2017-11-26 DIAGNOSIS — I2510 Atherosclerotic heart disease of native coronary artery without angina pectoris: Secondary | ICD-10-CM

## 2017-11-26 DIAGNOSIS — Z79899 Other long term (current) drug therapy: Secondary | ICD-10-CM

## 2017-11-26 MED ORDER — ROSUVASTATIN CALCIUM 40 MG PO TABS: 40.0000 mg | ORAL_TABLET | Freq: Every day | ORAL | 3 refills | Status: DC

## 2017-11-26 MED ORDER — LOSARTAN POTASSIUM 25 MG PO TABS: 12.5000 mg | ORAL_TABLET | Freq: Every day | ORAL | 3 refills | Status: DC

## 2017-11-26 NOTE — Patient Instructions (Addendum)
Medication Instructions:  Your physician has recommended you make the following change in your medication:  1- DECREASE Losartan to 12.5 mg  (1/2 tablet) by mouth once a day. 2- STOP Lipitor. 3- START Crestor 40 mg by mouth once a day.   Labwork: Your physician recommends that you return for lab work in: [place]. Lipid/ Liver - Sometime first week of December. - You will need to be FASTING. - You will be given lab slips to take to [facility].   Testing/Procedures: Your physician has requested that you have a lower extremity arterial doppler- During this test, ultrasound is used to evaluate arterial blood flow in the legs. Allow approximately one hour for this exam.   Your physician has requested that you have an ankle brachial index (ABI). During this test an ultrasound and blood pressure cuff are used to evaluate the arteries that supply the arms and legs with blood. Allow thirty minutes for this exam. There are no restrictions or special instructions.     Follow-Up: Your physician recommends that you schedule a follow-up appointment in: 3 MONTHS WITH DR END OR APP (Make sure lab work is drawn before appointment.).   If you need a refill on your cardiac medications before your next appointment, please call your pharmacy.    Ankle-Brachial Index Test The ankle-brachial index (ABI) test is used to find peripheral vascular disease (PVD). PVD is also known as peripheral arterial disease (PAD). PVD is the blocking or hardening of the arteries anywhere within the circulatory system beyond the heart. PVD is caused by cholesterol deposits in your blood vessels (atherosclerosis). These deposits cause arteries to narrow. The delivery of oxygen to your tissues is impaired as a result. This can cause muscle pain and fatigue. This is called claudication. PVD means there may also be buildup of cholesterol in your:  Heart. This increases the risk of heart attacks.  Brain. This increases the risk  of strokes.  The ankle-brachial index test measures the blood flow in your arms and legs. This test also determines if blood vessels in your leg are narrowed by cholesterol deposits. There are additional causes of a reduced ankle-brachial index, such as inflammation of vessels or a clot in the vessels. However, these are much less common than narrowing due to cholesterol deposits. What is being tested? The test is done while you are lying down and resting. Measurements are taken of the systolic pressure:  In your arm (brachial).  In your ankle at several points along your leg.  Systolic pressure is the pressure inside your arteries when your heart pumps. The measurements are taken several times on both sides. Then, the highest systolic pressure of the ankle is divided by the highest brachial systolic pressure. The result is the ankle-brachial pressure ratio, or ABI. Sometimes this test is repeated after you have exercised on a treadmill for five minutes. You may have leg pain during the exercise portion of the test if you suffer from PAD. If the index number drops after exercise, this may show that PAD is present. A normal ABI ratio is between 0.9 and 1.4. A value below 0.9 is considered abnormal. This information is not intended to replace advice given to you by your health care provider. Make sure you discuss any questions you have with your health care provider. Document Released: 03/10/2004 Document Revised: 08/12/2015 Document Reviewed: 10/10/2013 Elsevier Interactive Patient Education  [employer].

## 2017-12-14 ENCOUNTER — Ambulatory Visit (INDEPENDENT_AMBULATORY_CARE_PROVIDER_SITE_OTHER): Payer: Managed Care, Other (non HMO)

## 2017-12-14 DIAGNOSIS — I739 Peripheral vascular disease, unspecified: Secondary | ICD-10-CM | POA: Diagnosis not present

## 2018-02-26 LAB — LIPID PANEL
CHOL/HDL RATIO: 3.2 ratio (ref 0.0–4.4)
Cholesterol, Total: 111 mg/dL (ref 100–199)
HDL: 35 mg/dL — AB (ref >39)
LDL Calculated: 62 mg/dL (ref 0–99)
Triglycerides: 71 mg/dL (ref 0–149)
VLDL Cholesterol Cal: 14 mg/dL (ref 5–40)

## 2018-02-26 LAB — HEPATIC FUNCTION PANEL
ALBUMIN: 4.6 g/dL (ref 3.5–5.5)
ALK PHOS: 86 IU/L (ref 39–117)
ALT: 49 IU/L — ABNORMAL HIGH (ref 0–32)
AST: 38 IU/L (ref 0–40)
BILIRUBIN TOTAL: 0.5 mg/dL (ref 0.0–1.2)
BILIRUBIN, DIRECT: 0.16 mg/dL (ref 0.00–0.40)
TOTAL PROTEIN: 6.4 g/dL (ref 6.0–8.5)

## 2018-02-27 ENCOUNTER — Encounter: Payer: Self-pay | Admitting: Internal Medicine

## 2018-02-27 ENCOUNTER — Ambulatory Visit: Payer: Managed Care, Other (non HMO) | Admitting: Internal Medicine

## 2018-02-27 VITALS — BP 118/70 | HR 57 | Ht 70.5 in | Wt 221.5 lb

## 2018-02-27 DIAGNOSIS — I255 Ischemic cardiomyopathy: Secondary | ICD-10-CM | POA: Diagnosis not present

## 2018-02-27 DIAGNOSIS — E785 Hyperlipidemia, unspecified: Secondary | ICD-10-CM | POA: Diagnosis not present

## 2018-02-27 DIAGNOSIS — I2510 Atherosclerotic heart disease of native coronary artery without angina pectoris: Secondary | ICD-10-CM | POA: Diagnosis not present

## 2018-02-27 MED ORDER — CARVEDILOL 3.125 MG PO TABS: ORAL_TABLET | ORAL | 3 refills | Status: DC

## 2018-02-27 MED ORDER — CLOPIDOGREL BISULFATE 75 MG PO TABS: 75.0000 mg | ORAL_TABLET | Freq: Every day | ORAL | 3 refills | Status: DC

## 2018-02-27 MED ORDER — ROSUVASTATIN CALCIUM 40 MG PO TABS: 40.0000 mg | ORAL_TABLET | Freq: Every day | ORAL | 3 refills | Status: DC

## 2018-02-27 NOTE — Patient Instructions (Signed)
Medication Instructions:  Your physician has recommended you make the following change in your medication:  1- STOP Plavix.   If you need a refill on your cardiac medications before your next appointment, please call your pharmacy.   Lab work: none If you have labs (blood work) drawn today and your tests are completely normal, you will receive your results only by: Marland Kitchen MyChart Message (if you have MyChart) OR . A paper copy in the mail If you have any lab test that is abnormal or we need to change your treatment, we will call you to review the results.  Testing/Procedures: none  Follow-Up: At [facility], you and your health needs are our priority.  As part of our continuing mission to provide you with exceptional heart care, we have created designated Provider Care Teams.  These Care Teams include your primary Cardiologist (physician) and Advanced Practice Providers (APPs -  Physician Assistants and Nurse Practitioners) who all work together to provide you with the care you need, when you need it. You will need a follow up appointment in 6 months.  Please call our office 2 months in advance to schedule this appointment.  You may see DR Harrell Gave END or one of the following Advanced Practice Providers on your designated Care Team:   Murray Hodgkins, NP Christell Faith, PA-C . Marrianne Mood, PA-C

## 2018-02-27 NOTE — Progress Notes (Signed)
Follow-up Outpatient Visit Date: 02/27/2018  Primary Care Provider: Patient, No Pcp Per No address on file  Chief Complaint: Follow-up CAD  HPI:  Ms. Deanna Moran is a 52 y.o. year-old female with history of CAD with a NSTEMI in 06/2016 that was medically managed, HFrEF secondary to ICM, HTN, HLD, and tobacco abuse, who presents for follow-up of coronary artery disease.  She was last seen in our office in September, at which time she was doing well other than increased stress related to a death in the family.  No medication changes were made at that time.  ABIs were ordered to evaluate for PAD; these were normal.  Today, [name] feels well other than some recent sinus congestion and clear rhinorrhea.  She denies chest pain, shortness of breath, palpitations, and edema.  She has not been exercising regularly.  She is tolerating her medications well.  --------------------------------------------------------------------------------------------------  Cardiovascular History & Procedures: Cardiovascular Problems:  Coronary artery disease status post NSTEMI  Ischemic cardiomyopathy  Risk Factors:  Known coronary artery disease, hypertension, hyperlipidemia, and tobacco use  Cath/PCI:  LHC (07/11/16): Significant single vessel CAD with subacute versus chronic total occlusion of the ostial LAD with left to left and right-to-left collaterals. Mild to moderate nonobstructive CAD involving the LCx and RCA. LVEF 30-35% with anterior and apical wall motion abnormality.  CV Surgery:  None  EP Procedures and Devices:  None  Non-Invasive Evaluation(s):  ABIs (12/14/2017): Normal bilaterally (1.1 on the right, 1.1 on the left).  TTE (07/12/16): Technically difficult study. LVEF 40-45% with anterior hypokinesis and grade 1 diastolic dysfunction. Mild MR. Mild left atrial enlargement. Normal RV size and function.  Recent CV Pertinent Labs: Lab Results  Component Value Date   CHOL 111  02/25/2018   HDL 35 (L) 02/25/2018   LDLCALC 62 02/25/2018   LDLDIRECT 76 08/30/2016   TRIG 71 02/25/2018   CHOLHDL 3.2 02/25/2018   CHOLHDL 4.6 07/11/2016   INR 1.00 07/10/2016   K 4.4 11/02/2017   BUN 13 11/02/2017   CREATININE 0.82 11/02/2017    Past medical and surgical history were reviewed and updated in EPIC.  Current Meds  Medication Sig  . aspirin 81 MG EC tablet Take 1 tablet (81 mg total) by mouth daily.  . carvedilol (COREG) 3.125 MG tablet TAKE 1 TABLET BY MOUTH TWO  TIMES DAILY WITH A MEAL  . clopidogrel (PLAVIX) 75 MG tablet Take 1 tablet (75 mg total) by mouth daily with breakfast.  . ezetimibe (ZETIA) 10 MG tablet TAKE 1 TABLET BY MOUTH  DAILY  . losartan (COZAAR) 25 MG tablet Take 0.5 tablets (12.5 mg total) by mouth daily.  . naproxen sodium (ANAPROX) 220 MG tablet Take 220 mg by mouth as needed.  . nitroGLYCERIN (NITROSTAT) 0.4 MG SL tablet SEE DIRECTIONS ON  MEDICATION INFORMATION  SHEET WITH INVOICE  PAPERWORK  . prednisoLONE acetate (PRED FORTE) 1 % ophthalmic suspension Place 2 drops into both eyes 4 (four) times daily.  . rosuvastatin (CRESTOR) 40 MG tablet Take 1 tablet (40 mg total) by mouth daily.    Allergies: Patient has no known allergies.  Social History   Tobacco Use  . Smoking status: Current Every Day Smoker    Packs/day: 0.50    Years: 38.00    Pack years: 19.00    Types: Cigarettes  . Smokeless tobacco: Never Used  Substance Use Topics  . Alcohol use: No  . Drug use: Not on file    Family History  Problem  Relation Age of Onset  . Heart attack Father   . Anuerysm Brother        brain    Review of Systems: A 12-system review of systems was performed and was negative except as noted in the HPI.  --------------------------------------------------------------------------------------------------  Physical Exam: BP 118/70 (BP Location: Left Arm, Patient Position: Sitting, Cuff Size: Normal)   Pulse (!) 57   Ht 5' 10.5" (1.791 m)    Wt 221 lb 8 oz (100.5 kg)   BMI 31.33 kg/m   General:  NAD HEENT: No conjunctival pallor or scleral icterus. Moist mucous membranes.  OP clear. Neck: Supple without lymphadenopathy, thyromegaly, JVD, or HJR. Lungs: Normal work of breathing. Clear to auscultation bilaterally without wheezes or crackles. Heart: Regular rate and rhythm without murmurs, rubs, or gallops. Non-displaced PMI. Abd: Bowel sounds present. Soft, NT/ND without hepatosplenomegaly Ext: No lower extremity edema. Skin: Warm and dry without rash.  EKG:  NSR with right axis deviation and anterior Q waves.  Lab Results  Component Value Date   WBC 5.7 11/02/2017   HGB 13.5 11/02/2017   HCT 40.8 11/02/2017   MCV 88 11/02/2017   PLT 232 11/02/2017    Lab Results  Component Value Date   NA 142 11/02/2017   K 4.4 11/02/2017   CL 104 11/02/2017   CO2 25 11/02/2017   BUN 13 11/02/2017   CREATININE 0.82 11/02/2017   GLUCOSE 105 (H) 11/02/2017   ALT 49 (H) 02/25/2018    Lab Results  Component Value Date   CHOL 111 02/25/2018   HDL 35 (L) 02/25/2018   LDLCALC 62 02/25/2018   LDLDIRECT 76 08/30/2016   TRIG 71 02/25/2018   CHOLHDL 3.2 02/25/2018    --------------------------------------------------------------------------------------------------  ASSESSMENT AND PLAN: CAD without angina No chest pain or significant shortness of breath.  Given that it is now >12 months since the patient's MI with occluded LAD that was managed medically, we will stop clopidogrel and continue with ASA 81 mg daily.  Continue high-intensity statin therapy.  Ischemic cardiomyopathy Patient appears euvolemic with NYHA class II symptoms.  Continue current doses of carvedilol and losartan.  Hyperlipidemia LDL at goal.  Continue rosuvastatin 40 mg daily and ezetimibe 10 mg daily.  Follow-up: Return to clinic in 6 months.  Nelva Bush, MD 02/27/2018 1:57 PM

## 2018-03-01 ENCOUNTER — Encounter: Payer: Self-pay | Admitting: Internal Medicine

## 2018-04-01 ENCOUNTER — Encounter: Payer: Self-pay | Admitting: Family Medicine

## 2018-04-01 ENCOUNTER — Ambulatory Visit (INDEPENDENT_AMBULATORY_CARE_PROVIDER_SITE_OTHER): Payer: Managed Care, Other (non HMO)

## 2018-04-01 ENCOUNTER — Ambulatory Visit: Payer: Managed Care, Other (non HMO) | Admitting: Family Medicine

## 2018-04-01 VITALS — BP 140/62 | HR 79 | Temp 98.2°F | Resp 18 | Ht 68.5 in | Wt 225.4 lb

## 2018-04-01 DIAGNOSIS — R208 Other disturbances of skin sensation: Secondary | ICD-10-CM | POA: Diagnosis not present

## 2018-04-01 DIAGNOSIS — R200 Anesthesia of skin: Secondary | ICD-10-CM

## 2018-04-01 LAB — POCT URINE PREGNANCY: PREG TEST UR: NEGATIVE

## 2018-04-01 NOTE — Progress Notes (Signed)
Subjective:    Patient ID: Deanna Moran, female    DOB: 11-Feb-1966, 53 y.o.   MRN: [id number]  HPI   Patient presents to clinic to establish with PCP.  Overall she is feeling well.  Main concern today is numbness in both feet times many years.  Patient recently had a brachial plexus arterial flow study of her lower extremities due to the chronic foot numbness, blood flow was all normal.  Patient did have A1c checked in August 2019, is 5.9%.  Patient also has history of coronary artery disease, and myocardial infarction in 2018.  She follows regularly with cardiologist and takes medication to control blood pressure, cholesterol.  Patient has never had a colonoscopy.  Last mammogram was in 2010.  Last Pap smear was in 2009 or 2010.   Patient Active Problem List   Diagnosis Date Noted  . Chronic systolic heart failure ([place]) 04/25/2017  . Coronary artery disease involving native coronary artery of native heart without angina pectoris 12/21/2016  . Ischemic cardiomyopathy 12/21/2016  . Essential hypertension 07/11/2016  . Hyperlipidemia LDL goal <70 07/11/2016  . Morbid obesity ([place]) 07/11/2016  . Tobacco abuse 07/11/2016   Social History   Tobacco Use  . Smoking status: Current Every Day Smoker    Packs/day: 0.50    Years: 38.00    Pack years: 19.00    Types: Cigarettes  . Smokeless tobacco: Never Used  Substance Use Topics  . Alcohol use: Yes    Comment: ocassionally   Past Surgical History:  Procedure Laterality Date  . LEFT HEART CATH AND CORONARY ANGIOGRAPHY N/A 07/11/2016   Procedure: Left Heart Cath and Coronary Angiography;  Surgeon: Nelva Bush, MD;  Location: [facility];  Service: Cardiovascular;  Laterality: N/A;  . TUBAL LIGATION     Family History  Problem Relation Age of Onset  . Heart attack Father   . COPD Mother   . Anuerysm Brother        brain    Review of Systems  Constitutional: Negative for chills, fatigue and fever.  HENT:  Negative for congestion, ear pain, sinus pain and sore throat.   Eyes: Negative.   Respiratory: Negative for cough, shortness of breath and wheezing.   Cardiovascular: Negative for chest pain, palpitations and leg swelling.  Gastrointestinal: Negative for abdominal pain, diarrhea, nausea and vomiting.  Genitourinary: Negative for dysuria, frequency and urgency.  Musculoskeletal: Negative for arthralgias and myalgias.  Skin: Negative for color change, pallor and rash.  Neurological: Negative for syncope, light-headedness and headaches. +numbness in both feet Psychiatric/Behavioral: The patient is not nervous/anxious.       Objective:   Physical Exam Vitals signs and nursing note reviewed.  Constitutional:      General: She is not in acute distress.    Appearance: Normal appearance. She is not toxic-appearing.  HENT:     Head: Normocephalic and atraumatic.     Mouth/Throat:     Mouth: Mucous membranes are moist.  Eyes:     General: No scleral icterus.    Extraocular Movements: Extraocular movements intact.     Conjunctiva/sclera: Conjunctivae normal.  Neck:     Musculoskeletal: Neck supple. No neck rigidity.  Cardiovascular:     Rate and Rhythm: Normal rate and regular rhythm.     Pulses:          Posterior tibial pulses are 2+ on the right side and 2+ on the left side.     Heart sounds: Normal heart sounds.  Pulmonary:     Effort: Pulmonary effort is normal. No respiratory distress.     Breath sounds: Normal breath sounds.  Musculoskeletal:     Right foot: Normal range of motion. No deformity, bunion or foot drop.     Left foot: Normal range of motion. No deformity, bunion or foot drop.  Feet:     Right foot:     Skin integrity: Skin integrity normal.     Left foot:     Skin integrity: Skin integrity normal.     Comments: Able to feel me touching toes, mid foot and heel on both feet.  Skin:    General: Skin is warm and dry.     Coloration: Skin is not jaundiced or pale.       Findings: No erythema.  Neurological:     Mental Status: She is alert and oriented to person, place, and time.     Cranial Nerves: No cranial nerve deficit.     Sensory: No sensory deficit.     Motor: No weakness.     Coordination: Coordination normal.     Gait: Gait normal.  Psychiatric:        Mood and Affect: Mood normal.    Vitals:   04/01/18 0854  BP: 140/62  Pulse: 79  Resp: 18  Temp: 98.2 F (36.8 C)  SpO2: 95%      Assessment & Plan:   Bilateral foot numbness - unclear reason for this.  A1c indicates that she is prediabetic, but this should not cause a peripheral neuropathy.  Blood flow issues have been ruled out by the brachial plexus study.  We will get x-ray of lumbar spine to assess for any disc issues that could possibly lead to numbness in lower extremities.  We will also get a referral placed for neurology evaluation and nerve conduction study.  Patient will follow-up in 2 weeks for CPE and Pap smear.  We will plan to do new blood work at that time and get all of her wellness screenings up-to-date for her age.

## 2018-04-16 ENCOUNTER — Ambulatory Visit (INDEPENDENT_AMBULATORY_CARE_PROVIDER_SITE_OTHER): Payer: Managed Care, Other (non HMO) | Admitting: Family Medicine

## 2018-04-16 ENCOUNTER — Encounter: Payer: Self-pay | Admitting: Family Medicine

## 2018-04-16 ENCOUNTER — Other Ambulatory Visit (HOSPITAL_COMMUNITY)
Admission: RE | Admit: 2018-04-16 | Discharge: 2018-04-16 | Disposition: A | Discharge status: Home / Self Care | Payer: Managed Care, Other (non HMO) | Source: Ambulatory Visit | Attending: Family Medicine | Admitting: Family Medicine

## 2018-04-16 VITALS — BP 98/58 | HR 67 | Temp 97.9°F | Resp 16 | Ht 69.0 in | Wt 220.0 lb

## 2018-04-16 DIAGNOSIS — Z23 Encounter for immunization: Secondary | ICD-10-CM

## 2018-04-16 DIAGNOSIS — Z124 Encounter for screening for malignant neoplasm of cervix: Secondary | ICD-10-CM | POA: Insufficient documentation

## 2018-04-16 DIAGNOSIS — Z1239 Encounter for other screening for malignant neoplasm of breast: Secondary | ICD-10-CM | POA: Diagnosis not present

## 2018-04-16 DIAGNOSIS — Z0000 Encounter for general adult medical examination without abnormal findings: Secondary | ICD-10-CM | POA: Diagnosis not present

## 2018-04-16 DIAGNOSIS — Z1211 Encounter for screening for malignant neoplasm of colon: Secondary | ICD-10-CM

## 2018-04-16 NOTE — Patient Instructions (Signed)
Call to schedule your mammogram.

## 2018-04-16 NOTE — Progress Notes (Signed)
Subjective:    Patient ID: Deanna Moran, female    DOB: 1965-12-26, 53 y.o.   MRN: [id number]  HPI   Patient presents to clinic for CPE.  Today she is feeling well and she has no complaints.  She follows regularly with cardiology for her blood pressure, cholesterol and history of myocardial infarction.  Continues to smoke about half pack per day.  Currently has no plans to quit smoking.  She is due for mammogram, will get this ordered.  She is also due for Pap smear today we will get this done.  Has never had colonoscopy.  We will place referral for colon cancer screening for patient.  Patient recently had lab work via cardiology, I was able to review this.  Patient does see the eye doctor every 1 to 2 years.  Sees dentist usually once per year, sometimes twice.   Patient Active Problem List   Diagnosis Date Noted  . Chronic systolic heart failure ([place]) 04/25/2017  . Coronary artery disease involving native coronary artery of native heart without angina pectoris 12/21/2016  . Ischemic cardiomyopathy 12/21/2016  . Essential hypertension 07/11/2016  . Hyperlipidemia LDL goal <70 07/11/2016  . Morbid obesity ([place]) 07/11/2016  . Tobacco abuse 07/11/2016   Social History   Tobacco Use  . Smoking status: Current Every Day Smoker    Packs/day: 0.50    Years: 38.00    Pack years: 19.00    Types: Cigarettes  . Smokeless tobacco: Never Used  Substance Use Topics  . Alcohol use: Yes    Comment: ocassionally   Past Surgical History:  Procedure Laterality Date  . LEFT HEART CATH AND CORONARY ANGIOGRAPHY N/A 07/11/2016   Procedure: Left Heart Cath and Coronary Angiography;  Surgeon: Nelva Bush, MD;  Location: [facility];  Service: Cardiovascular;  Laterality: N/A;  . TUBAL LIGATION     Family History  Problem Relation Age of Onset  . Heart attack Father   . COPD Mother   . Anuerysm Brother        brain   Review of Systems   Constitutional: Negative for  chills, fatigue and fever.  HENT: Negative for congestion, ear pain, sinus pain and sore throat.   Eyes: Negative.   Respiratory: Negative for cough, shortness of breath and wheezing.   Cardiovascular: Negative for chest pain, palpitations and leg swelling.  Gastrointestinal: Negative for abdominal pain, diarrhea, nausea and vomiting.  Genitourinary: Negative for dysuria, frequency and urgency.  Musculoskeletal: Negative for arthralgias and myalgias.  Skin: Negative for color change, pallor and rash.  Neurological: Negative for syncope, light-headedness and headaches.  Psychiatric/Behavioral: The patient is not nervous/anxious.       Objective:   Physical Exam Vitals signs and nursing note reviewed.  Constitutional:      Appearance: She is well-developed.  HENT:     Head: Normocephalic and atraumatic.     Right Ear: External ear normal.     Left Ear: External ear normal.     Nose: Nose normal.  Eyes:     General: No scleral icterus.       Right eye: No discharge.        Left eye: No discharge.     Conjunctiva/sclera: Conjunctivae normal.     Pupils: Pupils are equal, round, and reactive to light.  Neck:     Musculoskeletal: Normal range of motion and neck supple.     Trachea: No tracheal deviation.  Cardiovascular:     Rate and  Rhythm: Normal rate and regular rhythm.     Heart sounds: Normal heart sounds. No murmur. No friction rub. No gallop.   Pulmonary:     Effort: Pulmonary effort is normal. No respiratory distress.     Breath sounds: Normal breath sounds. No wheezing or rales.  Chest:     Chest wall: No tenderness.     Breasts:        Right: Normal. No swelling, bleeding, inverted nipple, mass, nipple discharge, skin change or tenderness.        Left: Normal. No swelling, bleeding, inverted nipple, mass, nipple discharge, skin change or tenderness.  Abdominal:     General: Bowel sounds are normal.     Palpations: Abdomen is soft.     Tenderness: There is no  abdominal tenderness. There is no guarding.     Hernia: There is no hernia in the right inguinal area or left inguinal area.  Genitourinary:    General: Normal vulva.     Exam position: Supine.     Labia:        Right: No rash, tenderness, lesion or injury.        Left: No rash, tenderness, lesion or injury.      Urethra: No prolapse, urethral pain, urethral swelling or urethral lesion.     Vagina: Normal.     Cervix: Normal.     Uterus: Not tender.      Adnexa:        Right: No tenderness.         Left: No tenderness.       Rectum: Normal.     Comments: Pap collected and sent to lab Musculoskeletal: Normal range of motion.        General: No deformity.  Lymphadenopathy:     Cervical: No cervical adenopathy.     Upper Body:     Right upper body: No supraclavicular, axillary or pectoral adenopathy.     Left upper body: No supraclavicular, axillary or pectoral adenopathy.     Lower Body: No right inguinal adenopathy. No left inguinal adenopathy.  Skin:    General: Skin is warm and dry.     Capillary Refill: Capillary refill takes less than 2 seconds.     Coloration: Skin is not pale.     Findings: No erythema.  Neurological:     Mental Status: She is alert and oriented to person, place, and time.     Cranial Nerves: No cranial nerve deficit.  Psychiatric:        Behavior: Behavior normal.        Thought Content: Thought content normal.    Depression screen [facility] 2/9 04/16/2018 10/18/2016 08/03/2016  Decreased Interest 0 0 0  Down, Depressed, Hopeless 0 0 1  PHQ - 2 Score 0 0 1  Altered sleeping - 0 0  Tired, decreased energy - 0 1  Change in appetite - 0 0  Feeling bad or failure about yourself  - 0 1  Trouble concentrating - 0 0  Moving slowly or fidgety/restless - 0 0  Suicidal thoughts - 0 0  PHQ-9 Score - 0 3  Difficult doing work/chores - Not difficult at all Somewhat difficult     Vitals:   04/16/18 0901  BP: (!) 98/58  Pulse: 67  Resp: 16  Temp: 97.9 F (36.6  C)  SpO2: 94%      Assessment & Plan:   Well adult exam - most recent lab work reviewed with patient.  Mammogram ordered.  Referral to GI for colonoscopy ordered.  Pap smear performed in clinic today.  Tetanus booster given today.  Reviewed healthy diet and regular physical activity.  Recommended eating a diet full of lean protein and lots of vegetables, low in processed carbs and sugars.  Also recommended at least daily walking, lifting of small weights to keep self active.  Discussed regular visits to the eye doctor and dentist.  Patient does wear seatbelt whenever driving.  Also discussed sun protection, recommended use of SPF of at least 30 when outdoors and to reapply every 2 hours when outdoors for extended periods of time.  Recommended follow-up in 6 months, patient declines and states her other medical issues are managed by cardiology.  She will follow-up here annually for physical exam.  Advised she can return to clinic at any other time if needed if issues arise.

## 2018-04-17 ENCOUNTER — Telehealth: Payer: Self-pay

## 2018-04-17 LAB — CYTOLOGY - PAP
ADEQUACY: ABSENT
Diagnosis: NEGATIVE

## 2018-04-17 NOTE — Telephone Encounter (Signed)
Pt is calling for Sharyn Lull to schedule a colonoscopy

## 2018-04-18 ENCOUNTER — Other Ambulatory Visit: Payer: Self-pay

## 2018-04-18 DIAGNOSIS — Z1211 Encounter for screening for malignant neoplasm of colon: Secondary | ICD-10-CM

## 2018-04-18 NOTE — Telephone Encounter (Signed)
Returned patients call.  Colonoscopy has been scheduled for 05/13/18 at [facility] with Dr. Marius Ditch.  Thanks [employer]

## 2018-04-22 ENCOUNTER — Other Ambulatory Visit: Payer: Self-pay | Admitting: Family Medicine

## 2018-04-22 DIAGNOSIS — Z1239 Encounter for other screening for malignant neoplasm of breast: Secondary | ICD-10-CM

## 2018-05-01 DIAGNOSIS — M79671 Pain in right foot: Secondary | ICD-10-CM | POA: Insufficient documentation

## 2018-05-01 DIAGNOSIS — R202 Paresthesia of skin: Secondary | ICD-10-CM

## 2018-05-01 DIAGNOSIS — R200 Anesthesia of skin: Secondary | ICD-10-CM | POA: Insufficient documentation

## 2018-05-01 DIAGNOSIS — M79672 Pain in left foot: Secondary | ICD-10-CM

## 2018-05-03 ENCOUNTER — Ambulatory Visit
Admission: RE | Admit: 2018-05-03 | Discharge: 2018-05-03 | Disposition: A | Discharge status: Home / Self Care | Payer: Managed Care, Other (non HMO) | Source: Ambulatory Visit | Attending: Family Medicine | Admitting: Family Medicine

## 2018-05-03 DIAGNOSIS — Z1231 Encounter for screening mammogram for malignant neoplasm of breast: Secondary | ICD-10-CM | POA: Insufficient documentation

## 2018-05-03 DIAGNOSIS — Z1239 Encounter for other screening for malignant neoplasm of breast: Secondary | ICD-10-CM

## 2018-05-10 ENCOUNTER — Encounter: Payer: Self-pay | Admitting: *Deleted

## 2018-05-13 ENCOUNTER — Ambulatory Visit: Payer: Managed Care, Other (non HMO) | Admitting: Certified Registered"

## 2018-05-13 ENCOUNTER — Other Ambulatory Visit: Payer: Self-pay

## 2018-05-13 ENCOUNTER — Encounter: Payer: Self-pay | Admitting: *Deleted

## 2018-05-13 ENCOUNTER — Ambulatory Visit
Admission: RE | Admit: 2018-05-13 | Discharge: 2018-05-13 | Disposition: A | Discharge status: Home / Self Care | Payer: Managed Care, Other (non HMO) | Attending: Gastroenterology | Admitting: Gastroenterology

## 2018-05-13 ENCOUNTER — Encounter
Admission: RE | Disposition: A | Discharge status: Home / Self Care | Payer: Self-pay | Source: Home / Self Care | Attending: Gastroenterology

## 2018-05-13 DIAGNOSIS — I252 Old myocardial infarction: Secondary | ICD-10-CM | POA: Diagnosis not present

## 2018-05-13 DIAGNOSIS — E785 Hyperlipidemia, unspecified: Secondary | ICD-10-CM | POA: Insufficient documentation

## 2018-05-13 DIAGNOSIS — K644 Residual hemorrhoidal skin tags: Secondary | ICD-10-CM | POA: Insufficient documentation

## 2018-05-13 DIAGNOSIS — J449 Chronic obstructive pulmonary disease, unspecified: Secondary | ICD-10-CM | POA: Insufficient documentation

## 2018-05-13 DIAGNOSIS — Z79899 Other long term (current) drug therapy: Secondary | ICD-10-CM | POA: Diagnosis not present

## 2018-05-13 DIAGNOSIS — K529 Noninfective gastroenteritis and colitis, unspecified: Secondary | ICD-10-CM | POA: Insufficient documentation

## 2018-05-13 DIAGNOSIS — F17210 Nicotine dependence, cigarettes, uncomplicated: Secondary | ICD-10-CM | POA: Insufficient documentation

## 2018-05-13 DIAGNOSIS — I2510 Atherosclerotic heart disease of native coronary artery without angina pectoris: Secondary | ICD-10-CM | POA: Insufficient documentation

## 2018-05-13 DIAGNOSIS — K6389 Other specified diseases of intestine: Secondary | ICD-10-CM

## 2018-05-13 DIAGNOSIS — K633 Ulcer of intestine: Secondary | ICD-10-CM | POA: Diagnosis not present

## 2018-05-13 DIAGNOSIS — D123 Benign neoplasm of transverse colon: Secondary | ICD-10-CM | POA: Diagnosis not present

## 2018-05-13 DIAGNOSIS — Z7982 Long term (current) use of aspirin: Secondary | ICD-10-CM | POA: Insufficient documentation

## 2018-05-13 DIAGNOSIS — I10 Essential (primary) hypertension: Secondary | ICD-10-CM | POA: Diagnosis not present

## 2018-05-13 DIAGNOSIS — Z1211 Encounter for screening for malignant neoplasm of colon: Secondary | ICD-10-CM

## 2018-05-13 DIAGNOSIS — K219 Gastro-esophageal reflux disease without esophagitis: Secondary | ICD-10-CM | POA: Insufficient documentation

## 2018-05-13 HISTORY — PX: COLONOSCOPY WITH PROPOFOL: SHX5780

## 2018-05-13 SURGERY — COLONOSCOPY WITH PROPOFOL
Anesthesia: General

## 2018-05-13 MED ORDER — PROPOFOL 10 MG/ML IV BOLUS
INTRAVENOUS | Status: DC | PRN
  Administered 2018-05-13 (×10): 30 mg via INTRAVENOUS

## 2018-05-13 MED ORDER — SODIUM CHLORIDE 0.9 % IV SOLN
INTRAVENOUS | Status: DC
  Administered 2018-05-13: 09:00:00 via INTRAVENOUS

## 2018-05-13 MED ORDER — EPHEDRINE SULFATE 50 MG/ML IJ SOLN
INTRAMUSCULAR | Status: DC | PRN
  Administered 2018-05-13: 10 mg via INTRAVENOUS
  Administered 2018-05-13: 5 mg via INTRAVENOUS

## 2018-05-13 NOTE — Anesthesia Post-op Follow-up Note (Signed)
Anesthesia QCDR form completed.        

## 2018-05-13 NOTE — Anesthesia Postprocedure Evaluation (Signed)
Anesthesia Post Note  Patient: Animal nutritionist  Procedure(s) Performed: COLONOSCOPY WITH PROPOFOL (N/A )  Patient location during evaluation: Endoscopy Anesthesia Type: General Level of consciousness: awake and alert Pain management: pain level controlled Vital Signs Assessment: post-procedure vital signs reviewed and stable Respiratory status: spontaneous breathing, nonlabored ventilation, respiratory function stable and patient connected to nasal cannula oxygen Cardiovascular status: blood pressure returned to baseline and stable Postop Assessment: no apparent nausea or vomiting Anesthetic complications: no     Last Vitals:  Vitals:   05/13/18 0946 05/13/18 0956  BP: (!) 111/53 (!) 111/47  Pulse: (!) 57 70  Resp: 19 (!) 9  Temp:    SpO2: 100% 100%    Last Pain:  Vitals:   05/13/18 0956  TempSrc:   PainSc: 0-No pain                 Precious Haws Henrry Feil

## 2018-05-13 NOTE — Op Note (Signed)
[facility] Gastroenterology Patient Name: Deanna Moran Procedure Date: 05/13/2018 8:49 AM MRN: [id number] Account #: [id number] Date of Birth: 18-Apr-1965 Admit Type: Outpatient Age: 53 Room: [facility] ENDO ROOM 3 Gender: Female Note Status: Finalized Procedure:            Colonoscopy Indications:          Screening for colorectal malignant neoplasm, This is                        the patient's first colonoscopy Providers:            Lin Landsman MD, MD Referring MD:         No Local Md, MD (Referring MD) Medicines:            Monitored Anesthesia Care Complications:        No immediate complications. Estimated blood loss: None. Procedure:            Pre-Anesthesia Assessment:                       - Prior to the procedure, a History and Physical was                        performed, and patient medications and allergies were                        reviewed. The patient is competent. The risks and                        benefits of the procedure and the sedation options and                        risks were discussed with the patient. All questions                        were answered and informed consent was obtained.                        Patient identification and proposed procedure were                        verified by the physician, the nurse, the                        anesthesiologist, the anesthetist and the technician in                        the pre-procedure area in the procedure room in the                        endoscopy suite. Mental Status Examination: alert and                        oriented. Airway Examination: normal oropharyngeal                        airway and neck mobility. Respiratory Examination:                        clear to auscultation. CV Examination: normal.  Prophylactic Antibiotics: The patient does not require                        prophylactic antibiotics. Prior Anticoagulants: The           patient has taken no previous anticoagulant or                        antiplatelet agents. ASA Grade Assessment: III - A                        patient with severe systemic disease. After reviewing                        the risks and benefits, the patient was deemed in                        satisfactory condition to undergo the procedure. The                        anesthesia plan was to use general anesthesia.                        Immediately prior to administration of medications, the                        patient was re-assessed for adequacy to receive                        sedatives. The heart rate, respiratory rate, oxygen                        saturations, blood pressure, adequacy of pulmonary                        ventilation, and response to care were monitored                        throughout the procedure. The physical status of the                        patient was re-assessed after the procedure.                       After obtaining informed consent, the colonoscope was                        passed under direct vision. Throughout the procedure,                        the patient's blood pressure, pulse, and oxygen                        saturations were monitored continuously. The                        Colonoscope was introduced through the anus and                        advanced to the the terminal ileum, with identification  of the appendiceal orifice and IC valve. The                        colonoscopy was performed without difficulty. The                        patient tolerated the procedure well. The quality of                        the bowel preparation was evaluated using the BBPS                        [facility] Bowel Preparation Scale) with scores of: Right                        Colon = 3, Transverse Colon = 3 and Left Colon = 3                        (entire mucosa seen well with no residual staining,                         small fragments of stool or opaque liquid). The total                        BBPS score equals 9. Findings:      Skin tags were found on perianal exam.      The terminal ileum contained multiple scattered non-bleeding aphthae. No       stigmata of recent bleeding were seen. This was biopsied with a cold       forceps for histology.      A 5 mm polyp was found in the transverse colon. The polyp was sessile.       The polyp was removed with a cold snare. Resection and retrieval were       complete.      The retroflexed view of the distal rectum and anal verge was normal and       showed no anal or rectal abnormalities.      The exam was otherwise without abnormality. Impression:           - Perianal skin tags found on perianal exam.                       - Aphtha in the terminal ileum. Biopsied.                       - One 5 mm polyp in the transverse colon, removed with                        a cold snare. Resected and retrieved.                       - The distal rectum and anal verge are normal on                        retroflexion view.                       - The examination was otherwise normal. Recommendation:       - Discharge patient  to home (with escort).                       - Resume previous diet today.                       - Continue present medications.                       - Await pathology results.                       - Repeat colonoscopy in 7 years for surveillance. Procedure Code(s):    --- Professional ---                       [phone], Colonoscopy, flexible; with removal of tumor(s),                        polyp(s), or other lesion(s) by snare technique                       45380, 4, Colonoscopy, flexible; with biopsy, single                        or multiple Diagnosis Code(s):    --- Professional ---                       Z12.11, Encounter for screening for malignant neoplasm                        of colon                       K63.89, Other specified  diseases of intestine                       D12.3, Benign neoplasm of transverse colon (hepatic                        flexure or splenic flexure)                       K64.4, Residual hemorrhoidal skin tags CPT copyright 2018 [employer] Association. All rights reserved. The codes documented in this report are preliminary and upon coder review may  be revised to meet current compliance requirements. Dr. Ulyess Mort Lin Landsman MD, MD 05/13/2018 9:34:46 AM This report has been signed electronically. Number of Addenda: 0 Note Initiated On: 05/13/2018 8:49 AM Scope Withdrawal Time: 0 hours 8 minutes 48 seconds  Total Procedure Duration: 0 hours 13 minutes 53 seconds       [facility]

## 2018-05-13 NOTE — Anesthesia Preprocedure Evaluation (Signed)
Anesthesia Evaluation  Patient identified by MRN, date of birth, ID band Patient awake    Reviewed: Allergy & Precautions, H&P , NPO status , Patient's Chart, lab work & pertinent test results  History of Anesthesia Complications Negative for: history of anesthetic complications  Airway Mallampati: III  TM Distance: <3 FB Neck ROM: limited    Dental  (+) Chipped, Poor Dentition, Missing   Pulmonary COPD, Current Smoker,           Cardiovascular Exercise Tolerance: Good hypertension, (-) angina+ CAD and + Past MI  (-) DOE      Neuro/Psych negative neurological ROS  negative psych ROS   GI/Hepatic Neg liver ROS, GERD  Medicated and Controlled,  Endo/Other  negative endocrine ROS  Renal/GU negative Renal ROS  negative genitourinary   Musculoskeletal   Abdominal   Peds  Hematology negative hematology ROS (+)   Anesthesia Other Findings Past Medical History: No date: Blood in stool No date: Chicken pox No date: Chronic back pain No date: Coronary artery disease     Comment:  a. NSTEMI 4/18; b. LHC 4/18: LM nl, ost LAD 100% with               L-L & R-L collats, LCx nl, OM3 40%, RCA with mild irregs No date: GERD (gastroesophageal reflux disease) No date: Heart disease No date: HLD (hyperlipidemia) No date: HTN (hypertension) No date: Ischemic cardiomyopathy     Comment:  a. echo 4/18: EF 40-45%, ant HK, Gr1DD, mild MR, mild               LAE, nl RVSF 06/2016: NSTEMI (non-ST elevated myocardial infarction) ([place]) No date: Obesity  Past Surgical History: No date: CARDIAC CATHETERIZATION 07/11/2016: LEFT HEART CATH AND CORONARY ANGIOGRAPHY; N/A     Comment:  Procedure: Left Heart Cath and Coronary Angiography;                Surgeon: Nelva Bush, MD;  Location: [facility];  Service: Cardiovascular;  Laterality: N/A; No date: TUBAL LIGATION  BMI    Body Mass Index:  32.64 kg/m       Reproductive/Obstetrics negative OB ROS                             Anesthesia Physical Anesthesia Plan  ASA: III  Anesthesia Plan: General   Post-op Pain Management:    Induction: Intravenous  PONV Risk Score and Plan: Propofol infusion and TIVA  Airway Management Planned: Natural Airway and Nasal Cannula  Additional Equipment:   Intra-op Plan:   Post-operative Plan:   Informed Consent: I have reviewed the patients History and Physical, chart, labs and discussed the procedure including the risks, benefits and alternatives for the proposed anesthesia with the patient or authorized representative who has indicated his/her understanding and acceptance.     Dental Advisory Given  Plan Discussed with: Anesthesiologist, CRNA and Surgeon  Anesthesia Plan Comments: (Patient consented for risks of anesthesia including but not limited to:  - adverse reactions to medications - risk of intubation if required - damage to teeth, lips or other oral mucosa - sore throat or hoarseness - Damage to heart, brain, lungs or loss of life  Patient voiced understanding.)        Anesthesia Quick Evaluation

## 2018-05-13 NOTE — H&P (Signed)
Cephas Darby, MD [address]  [place]  [place], [place] [address]  Main: [phone]  Fax: [phone] Pager: [phone]  Primary Care Physician:  Jodelle Green, [name] Primary Gastroenterologist:  Dr. Cephas Darby  Pre-Procedure History & Physical: HPI:  Deanna Moran is a 53 y.o. female is here for an colonoscopy.   Past Medical History:  Diagnosis Date  . Blood in stool   . Chicken pox   . Chronic back pain   . Coronary artery disease    a. NSTEMI 4/18; b. LHC 4/18: LM nl, ost LAD 100% with L-L & R-L collats, LCx nl, OM3 40%, RCA with mild irregs  . GERD (gastroesophageal reflux disease)   . Heart disease   . HLD (hyperlipidemia)   . HTN (hypertension)   . Ischemic cardiomyopathy    a. echo 4/18: EF 40-45%, ant HK, Gr1DD, mild MR, mild LAE, nl RVSF  . NSTEMI (non-ST elevated myocardial infarction) ([place]) 06/2016  . Obesity     Past Surgical History:  Procedure Laterality Date  . CARDIAC CATHETERIZATION    . LEFT HEART CATH AND CORONARY ANGIOGRAPHY N/A 07/11/2016   Procedure: Left Heart Cath and Coronary Angiography;  Surgeon: Nelva Bush, MD;  Location: [facility];  Service: Cardiovascular;  Laterality: N/A;  . TUBAL LIGATION      Prior to Admission medications   Medication Sig Start Date End Date Taking? Authorizing Provider  aspirin 81 MG EC tablet Take 1 tablet (81 mg total) by mouth daily. 04/25/17   End, Harrell Gave, MD  carvedilol (COREG) 3.125 MG tablet TAKE 1 TABLET BY MOUTH TWO  TIMES DAILY WITH A MEAL 02/27/18   End, Harrell Gave, MD  ezetimibe (ZETIA) 10 MG tablet TAKE 1 TABLET BY MOUTH  DAILY 10/24/17   End, Harrell Gave, MD  losartan (COZAAR) 25 MG tablet Take 0.5 tablets (12.5 mg total) by mouth daily. 11/26/17   Dunn, Areta Haber, PA-C  naproxen sodium (ANAPROX) 220 MG tablet Take 220 mg by mouth as needed.    [provider]  nitroGLYCERIN (NITROSTAT) 0.4 MG SL tablet SEE DIRECTIONS ON  MEDICATION INFORMATION  SHEET WITH  INVOICE  PAPERWORK 10/23/17   End, Harrell Gave, MD  prednisoLONE acetate (PRED FORTE) 1 % ophthalmic suspension Place 2 drops into both eyes 4 (four) times daily.    [provider]  rosuvastatin (CRESTOR) 40 MG tablet Take 1 tablet (40 mg total) by mouth daily. 02/27/18 05/28/18  Nelva Bush, MD    Allergies as of 04/18/2018  . (No Known Allergies)    Family History  Problem Relation Age of Onset  . Heart attack Father   . COPD Mother   . Anuerysm Brother        brain    Social History   Socioeconomic History  . Marital status: Divorced    Spouse name: Not on file  . Number of children: Not on file  . Years of education: Not on file  . Highest education level: Not on file  Occupational History  . Not on file  Social Needs  . Financial resource strain: Not on file  . Food insecurity:    Worry: Not on file    Inability: Not on file  . Transportation needs:    Medical: Not on file    Non-medical: Not on file  Tobacco Use  . Smoking status: Current Every Day Smoker    Packs/day: 0.50    Years: 38.00    Pack years: 19.00  Types: Cigarettes  . Smokeless tobacco: Never Used  Substance and Sexual Activity  . Alcohol use: Yes    Comment: ocassionally  . Drug use: Never  . Sexual activity: Not Currently  Lifestyle  . Physical activity:    Days per week: Not on file    Minutes per session: Not on file  . Stress: Not on file  Relationships  . Social connections:    Talks on phone: Not on file    Gets together: Not on file    Attends religious service: Not on file    Active member of club or organization: Not on file    Attends meetings of clubs or organizations: Not on file    Relationship status: Not on file  . Intimate partner violence:    Fear of current or ex partner: Not on file    Emotionally abused: Not on file    Physically abused: Not on file    Forced sexual activity: Not on file  Other Topics Concern  . Not on file  Social History  Narrative  . Not on file    Review of Systems: See HPI, otherwise negative ROS  Physical Exam: BP (!) 142/68   Pulse 76   Temp (!) 97 F (36.1 C) (Tympanic)   Resp 20   Ht 5' 9"  (1.753 m)   Wt 100.2 kg   SpO2 97%   BMI 32.64 kg/m  General:   Alert,  pleasant and cooperative in NAD Head:  Normocephalic and atraumatic. Neck:  Supple; no masses or thyromegaly. Lungs:  Clear throughout to auscultation.    Heart:  Regular rate and rhythm. Abdomen:  Soft, nontender and nondistended. Normal bowel sounds, without guarding, and without rebound.   Neurologic:  Alert and  oriented x4;  grossly normal neurologically.  Impression/Plan: Deanna Moran is here for an colonoscopy to be performed for colon cancer screening  Risks, benefits, limitations, and alternatives regarding  colonoscopy have been reviewed with the patient.  Questions have been answered.  All parties agreeable.   Sherri Sear, MD  05/13/2018, 8:54 AM

## 2018-05-13 NOTE — Transfer of Care (Signed)
Immediate Anesthesia Transfer of Care Note  Patient: Deanna Moran  Procedure(s) Performed: COLONOSCOPY WITH PROPOFOL (N/A )  Patient Location: Endoscopy Unit  Anesthesia Type:General  Level of Consciousness: drowsy and responds to stimulation  Airway & Oxygen Therapy: Patient Spontanous Breathing and Patient connected to face mask oxygen  Post-op Assessment: Report given to RN and Post -op Vital signs reviewed and stable  Post vital signs: Reviewed and stable  Last Vitals:  Vitals Value Taken Time  BP 106/45 05/13/2018  9:36 AM  Temp 36.1 C 05/13/2018  9:36 AM  Pulse 67 05/13/2018  9:37 AM  Resp 15 05/13/2018  9:37 AM  SpO2 100 % 05/13/2018  9:37 AM  Vitals shown include unvalidated device data.  Last Pain:  Vitals:   05/13/18 0936  TempSrc: Tympanic  PainSc:          Complications: No apparent anesthesia complications

## 2018-05-14 LAB — SURGICAL PATHOLOGY

## 2018-05-24 ENCOUNTER — Encounter: Payer: Self-pay | Admitting: Gastroenterology

## 2018-05-24 ENCOUNTER — Ambulatory Visit: Payer: Managed Care, Other (non HMO) | Admitting: Gastroenterology

## 2018-05-24 ENCOUNTER — Other Ambulatory Visit: Payer: Self-pay

## 2018-05-24 VITALS — BP 144/78 | HR 71 | Resp 17 | Ht 69.0 in | Wt 222.8 lb

## 2018-05-24 DIAGNOSIS — K5000 Crohn's disease of small intestine without complications: Secondary | ICD-10-CM | POA: Insufficient documentation

## 2018-05-24 MED ORDER — BUDESONIDE 3 MG PO CPEP: 9.0000 mg | ORAL_CAPSULE | Freq: Every day | ORAL | 1 refills | Status: AC

## 2018-05-24 NOTE — Progress Notes (Signed)
Cephas Darby, MD [address]  [place]  [place], [place] [address]  Main: [phone]  Fax: [phone]    Gastroenterology Consultation  Referring Provider:     Jodelle Green, [name], [name], [name]  for consultation & management of new diagnosis of small bowel Crohn's.  She is here to discuss about her colonoscopy results.  She recently underwent screening colonoscopy and was incidentally found to have Crohn's disease of in her terminal ileum, mild in severity.  Patient is currently asymptomatic, reports having 1 bowel movement daily without any abdominal pain, weight loss, rectal bleeding, nausea or vomiting.  She smokes tobacco, used to smoke cigarettes 2 packs/day, currently smoking half pack per day.  NSAIDs: Occasional Aleve  Antiplts/Anticoagulants/Anti thrombotics: Aspirin 81 mg  GI Procedures: Colonoscopy 05/13/2018 - Perianal skin tags found on perianal exam. - Aphtha in the terminal ileum. Biopsied. - One 5 mm polyp in the transverse colon, removed with a cold snare. Resected and retrieved. - The distal rectum and anal verge are normal on retroflexion view. - The examination was otherwise normal.  DIAGNOSIS:  A. COLON POLYP, TRANSVERSE; BIOPSIES:  - TUBULAR ADENOMA.  - NEGATIVE FOR HIGH-GRADE DYSPLASIA AND MALIGNANCY.   B. TERMINAL ILEUM, ULCERATIONS; BIOPSY:  - CHRONIC ACTIVE ILEITIS WITH ULCERATION.  - NO GRANULOMAS OR DYSPLASIA IDENTIFIED  Comment:  The changes in the terminal ileum would be consistent with regional  enteritis (Crohn's disease). The differential diagnosis could possibly  include NSAID related mucosal injury.  She denies family history of GI malignancy, inflammatory bowel disease  Past Medical History:  Diagnosis Date   . Blood in stool   . Chicken pox   . Chronic back pain   . Coronary artery disease    a. NSTEMI 4/18; b. LHC 4/18: LM nl, ost LAD 100% with L-L & R-L collats, LCx nl, OM3 40%, RCA with mild irregs  . GERD (gastroesophageal reflux disease)   . Heart disease   . HLD (hyperlipidemia)   . HTN (hypertension)   . Ischemic cardiomyopathy    a. echo 4/18: EF 40-45%, ant HK, Gr1DD, mild MR, mild LAE, nl RVSF  . NSTEMI (non-ST elevated myocardial infarction) ([place]) 06/2016  . Obesity     Past Surgical History:  Procedure Laterality Date  . CARDIAC CATHETERIZATION    . COLONOSCOPY WITH PROPOFOL N/A 05/13/2018   Procedure: COLONOSCOPY WITH PROPOFOL;  Surgeon: Lin Landsman, MD;  Location: [facility] ENDOSCOPY;  Service: Gastroenterology;  Laterality: N/A;  . LEFT HEART CATH AND CORONARY ANGIOGRAPHY N/A 07/11/2016   Procedure: Left Heart Cath and Coronary Angiography;  Surgeon: Nelva Bush, MD;  Location: [facility];  Service: Cardiovascular;  Laterality: N/A;  . TUBAL LIGATION      Current Outpatient Medications:  .  aspirin 81 MG EC tablet, Take 1 tablet (81 mg total) by mouth daily., Disp: 90 tablet, Rfl: 3 .  carvedilol (COREG) 3.125 MG tablet, TAKE 1 TABLET BY MOUTH TWO  TIMES DAILY WITH A MEAL, Disp: 180 tablet, Rfl: 3 .  ezetimibe (ZETIA) 10 MG tablet, TAKE 1 TABLET BY MOUTH  DAILY, Disp: 90 tablet, Rfl: 3 .  naproxen sodium (ANAPROX) 220 MG tablet, Take 220 mg by mouth as  needed., Disp: , Rfl:  .  nitroGLYCERIN (NITROSTAT) 0.4 MG SL tablet, SEE DIRECTIONS ON  MEDICATION INFORMATION  SHEET WITH INVOICE  PAPERWORK, Disp: 25 tablet, Rfl: 1 .  prednisoLONE acetate (PRED FORTE) 1 % ophthalmic suspension, Place 2 drops into both eyes 4 (four) times daily., Disp: , Rfl:  .  rosuvastatin (CRESTOR) 40 MG tablet, Take 1 tablet (40 mg total) by mouth daily., Disp: 90 tablet, Rfl: 3 .  budesonide (ENTOCORT EC) 3 MG 24 hr capsule, Take 3 capsules (9 mg total) by mouth daily., Disp: 270  capsule, Rfl: 1 .  losartan (COZAAR) 25 MG tablet, Take 0.5 tablets (12.5 mg total) by mouth daily. (Patient not taking: Reported on 05/24/2018), Disp: 45 tablet, Rfl: 3   Family History  Problem Relation Age of Onset  . Heart attack Father   . COPD Mother   . Anuerysm Brother        brain     Social History   Tobacco Use  . Smoking status: Current Every Day Smoker    Packs/day: 0.50    Years: 38.00    Pack years: 19.00    Types: Cigarettes  . Smokeless tobacco: Never Used  Substance Use Topics  . Alcohol use: Yes    Comment: ocassionally  . Drug use: Never    Allergies as of 05/24/2018  . (No Known Allergies)    Review of Systems:    All systems reviewed and negative except where noted in HPI.   Physical Exam:  BP (!) 144/78 (BP Location: Left Arm, Patient Position: Sitting, Cuff Size: Large)   Pulse 71   Resp 17   Ht 5\' 9"  (1.753 m)   Wt 222 lb 12.8 oz (101.1 kg)   BMI 32.90 kg/m  No LMP recorded. Patient is postmenopausal.  General:   Alert,  Well-developed, well-nourished, pleasant and cooperative in NAD Head:  Normocephalic and atraumatic. Eyes:  Sclera clear, no icterus.   Conjunctiva pink. Ears:  Normal auditory acuity. Nose:  No deformity, discharge, or lesions. Mouth:  No deformity or lesions,oropharynx pink & moist. Neck:  Supple; no masses or thyromegaly. Lungs:  Respirations even and unlabored.  Clear throughout to auscultation.   No wheezes, crackles, or rhonchi. No acute distress. Heart:  Regular rate and rhythm; no murmurs, clicks, rubs, or gallops. Abdomen:  Normal bowel sounds. Soft, non-tender and non-distended without masses, hepatosplenomegaly or hernias noted.  No guarding or rebound tenderness.   Rectal: Not performed Msk:  Symmetrical without gross deformities. Good, equal movement & strength bilaterally. Pulses:  Normal pulses noted. Extremities:  No clubbing or edema.  No cyanosis. Neurologic:  Alert and oriented x3;  grossly normal  neurologically. Skin:  Intact without significant lesions or rashes. No jaundice. Psych:  Alert and cooperative. Normal mood and affect.  Imaging Studies: No abdominal imaging  Assessment and Plan:   Deanna Moran is a 53 y.o. female with chronic tobacco use, coronary disease, ischemic cardiomyopathy on aspirin 81 mg daily has new diagnosis of asymptomatic small bowel Crohn's, mild in severity after the colonoscopy performed for colon cancer screening.  No evidence of anemia  Recommend budesonide 3 mg 3 capsules daily at least for 3 months Strongly advised about complete abstinence from smoking tobacco Discussed with her about monitoring of her disease with follow-up video capsule endoscopy to assess inflammation in her small bowel in next 3 to 6 months on budesonide as she is asymptomatic Avoid NSAIDs   Follow up in 3 months  Cephas Darby, MD

## 2018-05-24 NOTE — Progress Notes (Deleted)
Cephas Darby, MD [address]  [place]  [place], [place] [address]  Main: [phone]  Fax: [phone] Pager: [phone]   Primary Care Physician: Jodelle Green, [name]  Primary Gastroenterologist:  Dr. Cephas Darby  Chief Complaint  Patient presents with  . Follow-up    procedure    HPI: Deanna Moran is a 53 y.o. female  Current Outpatient Medications  Medication Sig Dispense Refill  . aspirin 81 MG EC tablet Take 1 tablet (81 mg total) by mouth daily. 90 tablet 3  . carvedilol (COREG) 3.125 MG tablet TAKE 1 TABLET BY MOUTH TWO  TIMES DAILY WITH A MEAL 180 tablet 3  . ezetimibe (ZETIA) 10 MG tablet TAKE 1 TABLET BY MOUTH  DAILY 90 tablet 3  . naproxen sodium (ANAPROX) 220 MG tablet Take 220 mg by mouth as needed.    . nitroGLYCERIN (NITROSTAT) 0.4 MG SL tablet SEE DIRECTIONS ON  MEDICATION INFORMATION  SHEET WITH INVOICE  PAPERWORK 25 tablet 1  . prednisoLONE acetate (PRED FORTE) 1 % ophthalmic suspension Place 2 drops into both eyes 4 (four) times daily.    . rosuvastatin (CRESTOR) 40 MG tablet Take 1 tablet (40 mg total) by mouth daily. 90 tablet 3  . budesonide (ENTOCORT EC) 3 MG 24 hr capsule Take 3 capsules (9 mg total) by mouth daily. 270 capsule 1  . losartan (COZAAR) 25 MG tablet Take 0.5 tablets (12.5 mg total) by mouth daily. (Patient not taking: Reported on 05/24/2018) 45 tablet 3   No current facility-administered medications for this visit.     Allergies as of 05/24/2018  . (No Known Allergies)    NSAIDs: ***  Antiplts/Anticoagulants/Anti thrombotics: ***  GI procedures: ***  ROS:  General: Negative for anorexia, weight loss, fever, chills, fatigue, weakness. ENT: Negative for hoarseness, difficulty swallowing , nasal congestion. CV: Negative for chest pain, angina, palpitations, dyspnea on exertion, peripheral edema.  Respiratory: Negative for dyspnea at rest, dyspnea on exertion, cough, sputum, wheezing.  GI: See history of  present illness. GU:  Negative for dysuria, hematuria, urinary incontinence, urinary frequency, nocturnal urination.  Endo: Negative for unusual weight change.    Physical Examination:   BP (!) 144/78 (BP Location: Left Arm, Patient Position: Sitting, Cuff Size: Large)   Pulse 71   Resp 17   Ht 5' 9"  (1.753 m)   Wt 222 lb 12.8 oz (101.1 kg)   BMI 32.90 kg/m   General: Well-nourished, well-developed in no acute distress.  Eyes: No icterus. Conjunctivae pink. Mouth: Oropharyngeal mucosa moist and pink , no lesions erythema or exudate. Lungs: Clear to auscultation bilaterally. Non-labored. Heart: Regular rate and rhythm, no murmurs rubs or gallops.  Abdomen: Bowel sounds are normal, nontender, nondistended, no hepatosplenomegaly or masses, no hernia , no rebound or guarding.   Extremities: No lower extremity edema. No clubbing or deformities. Neuro: Alert and oriented x 3.  Grossly intact. Skin: Warm and dry, no jaundice.   Psych: Alert and cooperative, normal mood and affect.   Imaging Studies: Mm 3d Screen Breast Bilateral  Result Date: 05/03/2018 CLINICAL DATA:  Screening. EXAM: DIGITAL SCREENING BILATERAL MAMMOGRAM WITH TOMO AND CAD COMPARISON:  Previous exam(s). ACR Breast Density Category b: There are scattered areas of fibroglandular density. FINDINGS: There are no findings suspicious for malignancy. Images were processed with CAD. IMPRESSION: No mammographic evidence of malignancy. A result letter of this screening mammogram will be mailed directly to the patient. RECOMMENDATION: Screening mammogram in one year. (Code:SM-B-01Y) BI-RADS  CATEGORY  1: Negative. Electronically Signed   By: Lillia Mountain M.D.   On: 05/03/2018 16:09    Assessment and Plan:   Deanna Moran is a 53 y.o. female ***  Follow up in ***   Dr Sherri Sear, MD

## 2018-07-29 IMAGING — CR DG CHEST 2V
1 series · 2 of 2 positions shown · non-contrast
Comparison: None.

CLINICAL DATA: Intermittent chest pain over the last 3 weeks.

EXAM:
CHEST  2 VIEW

[Series 1: w chest pa · 0.14mm/px · 2 of 2 slices shown]
[im 1/2]
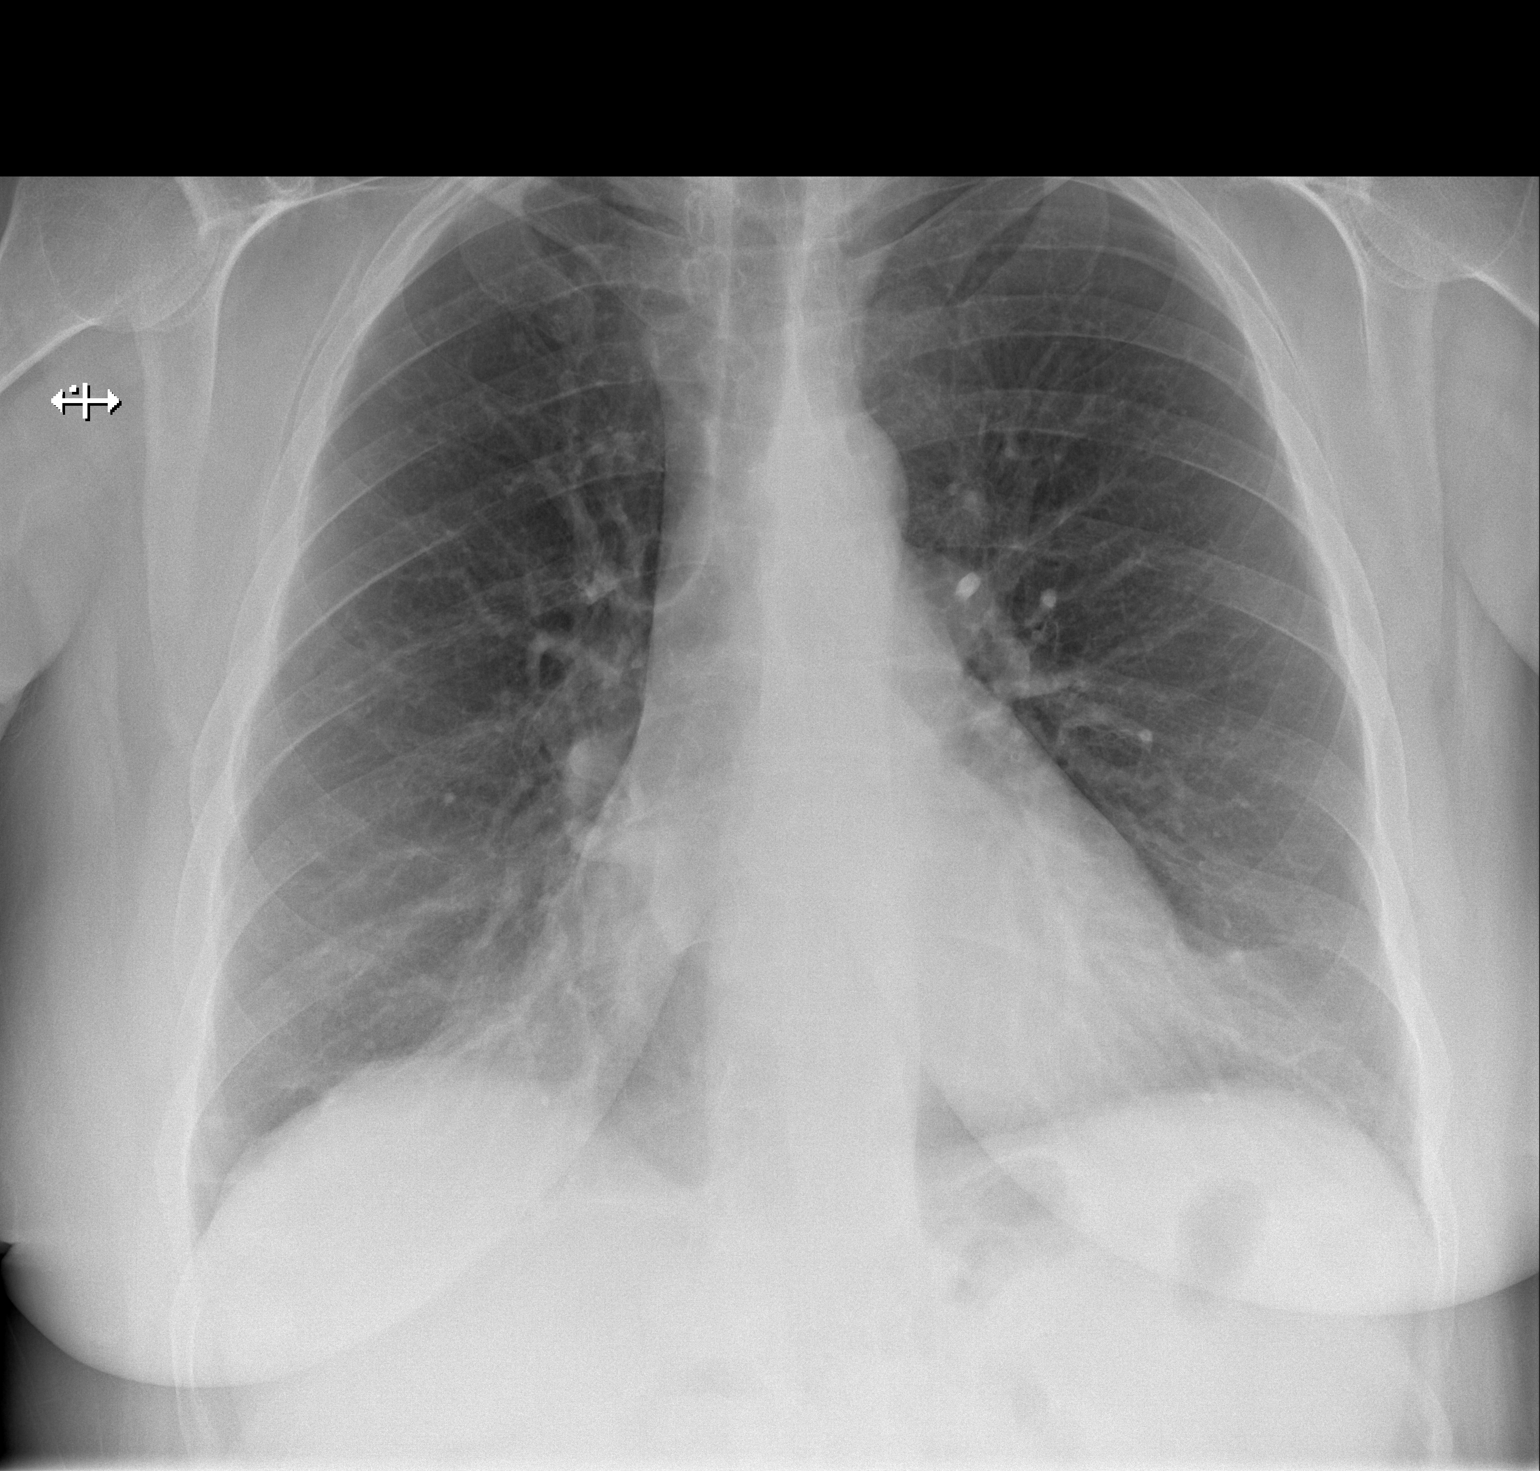
[im 2/2]
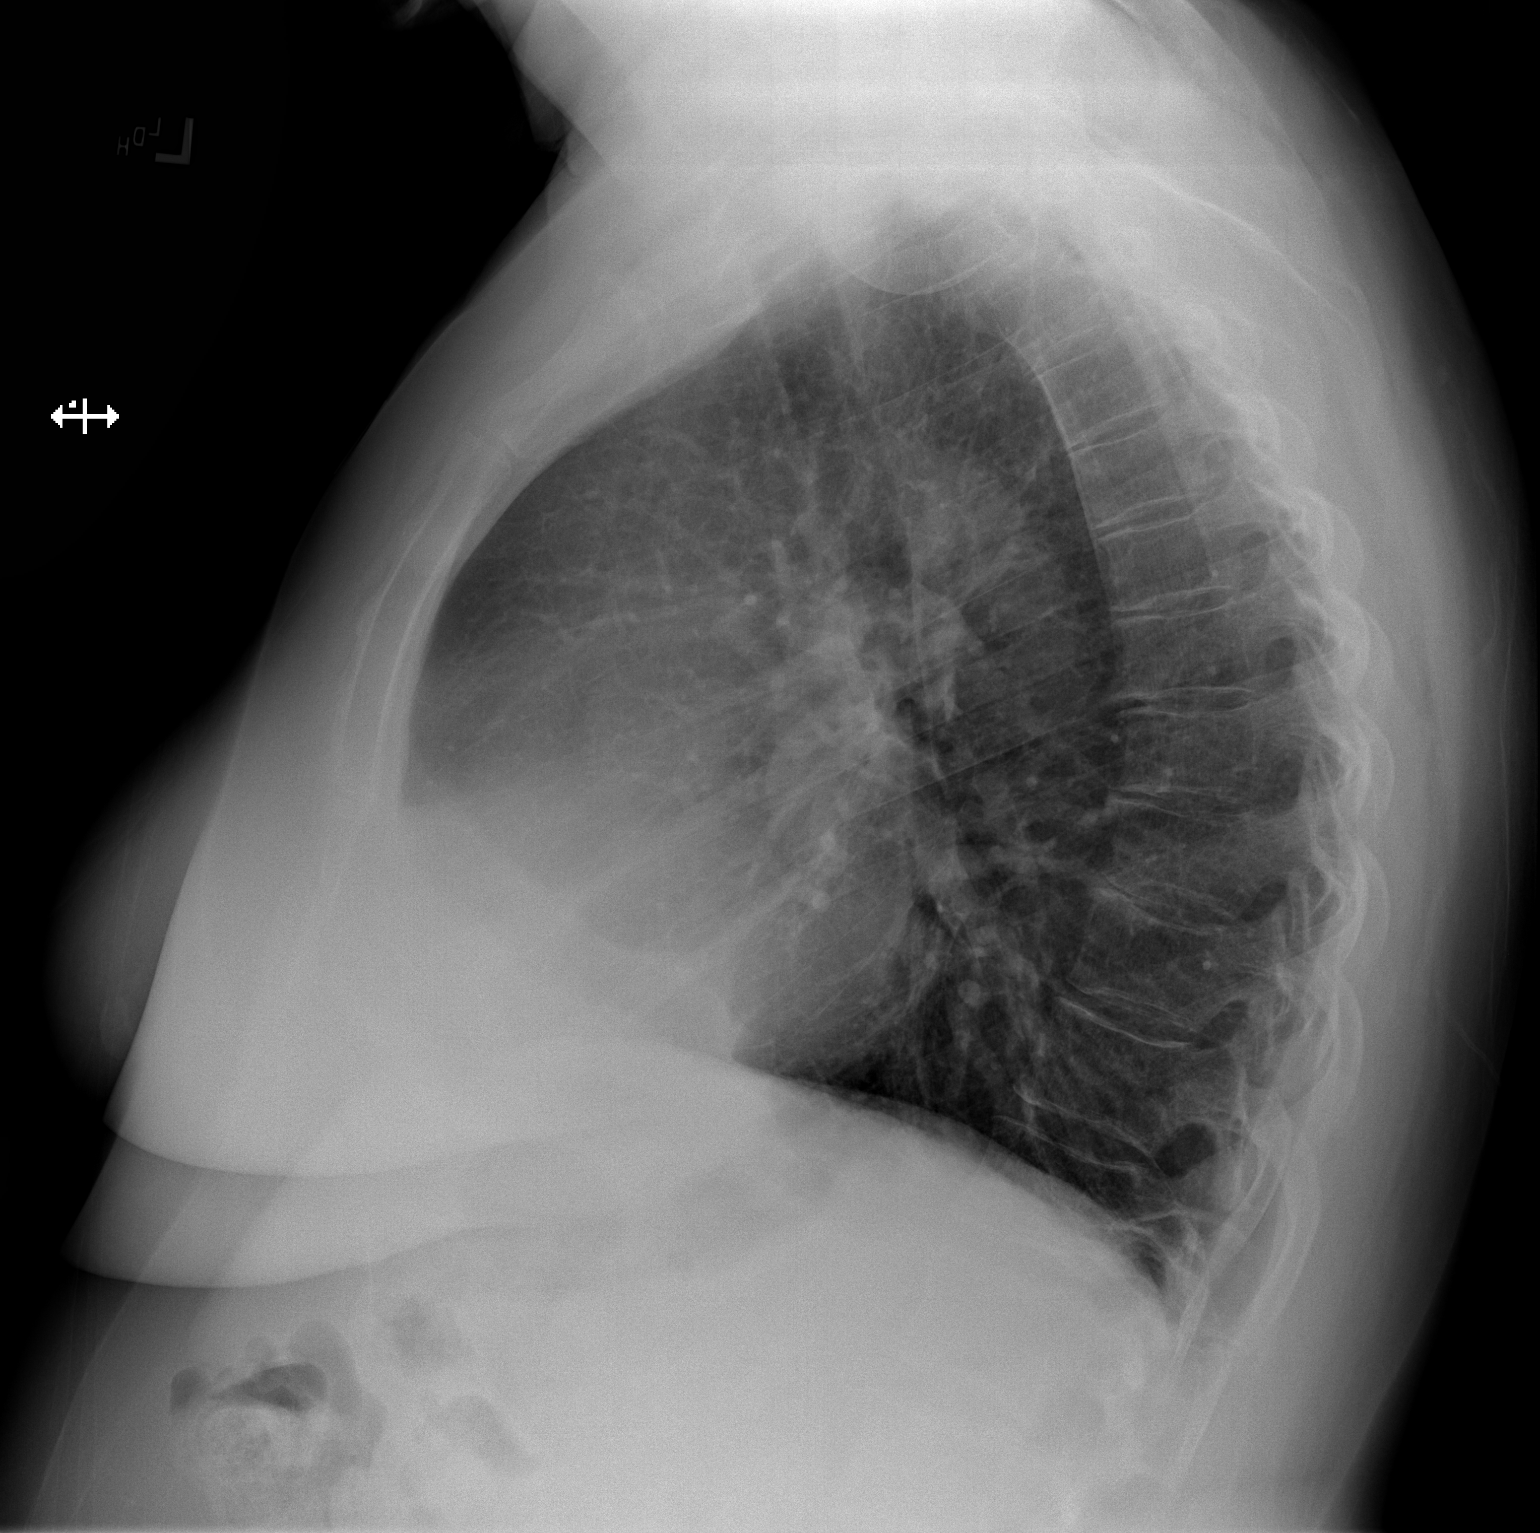

[2 of 2 positions shown; findings below may reference images not displayed]

FINDINGS: Heart size is normal. Mediastinal shadows are normal. The lungs are
clear. No bronchial thickening. No infiltrate, mass, effusion or
collapse. Pulmonary vascularity is normal. No bony abnormality.
IMPRESSION: Normal chest

## 2018-08-02 ENCOUNTER — Telehealth: Payer: Self-pay | Admitting: *Deleted

## 2018-08-02 NOTE — Telephone Encounter (Signed)
Virtual Visit Pre-Appointment Phone Call  "(Name), I am calling you today to discuss your upcoming appointment. We are currently trying to limit exposure to the virus that causes COVID-19 by seeing patients at home rather than in the office."  1. "What is the BEST phone number to call the day of the visit?" - include this in appointment notes  2. "Do you have or have access to (through a family member/friend) a smartphone with video capability that we can use for your visit?" a. If yes - list this number in appt notes as "cell" (if different from BEST phone #) and list the appointment type as a VIDEO visit in appointment notes b. If no - list the appointment type as a PHONE visit in appointment notes  3. Confirm consent - "In the setting of the current Covid19 crisis, you are scheduled for a (VIDEO) visit with your provider on (08/05/2018) at (4:30 PM).  Just as we do with many in-office visits, in order for you to participate in this visit, we must obtain consent.  If you'd like, I can send this to your mychart (if signed up) or email for you to review.  Otherwise, I can obtain your verbal consent now.  All virtual visits are billed to your insurance company just like a normal visit would be.  By agreeing to a virtual visit, we'd like you to understand that the technology does not allow for your provider to perform an examination, and thus may limit your provider's ability to fully assess your condition. If your provider identifies any concerns that need to be evaluated in person, we will make arrangements to do so.  Finally, though the technology is pretty good, we cannot assure that it will always work on either your or our end, and in the setting of a video visit, we may have to convert it to a phone-only visit.  In either situation, we cannot ensure that we have a secure connection.  Are you willing to proceed?" YES  4. Advise patient to be prepared - "Two hours prior to your appointment, go  ahead and check your blood pressure, pulse, oxygen saturation, and your weight (if you have the equipment to check those) and write them all down. When your visit starts, your provider will ask you for this information. If you have an Apple Watch or Kardia device, please plan to have heart rate information ready on the day of your appointment. Please have a pen and paper handy nearby the day of the visit as well."  5. Give patient instructions for MyChart download to smartphone OR Doximity/Doxy.me as below if video visit (depending on what platform provider is using)  6. Inform patient they will receive a phone call 15 minutes prior to their appointment time (may be from unknown caller ID) so they should be prepared to answer    TELEPHONE CALL NOTE  Deanna Moran has been deemed a candidate for a follow-up tele-health visit to limit community exposure during the Covid-19 pandemic. I spoke with the patient via phone to ensure availability of phone/video source, confirm preferred email & phone number, and discuss instructions and expectations.  I reminded Deanna Moran to be prepared with any vital sign and/or heart rhythm information that could potentially be obtained via home monitoring, at the time of her visit. I reminded Deanna Moran to expect a phone call prior to her visit.  Deanna Moran, CMA 08/02/2018 2:52 PM   INSTRUCTIONS FOR DOWNLOADING THE [facility]  APP TO SMARTPHONE  - The patient must first make sure to have activated MyChart and know their login information - If Apple, go to [employer] and type in MyChart in the search bar and download the app. If Android, ask patient to go to [employer] and type in Ruth in the search bar and download the app. The app is free but as with any other app downloads, their phone may require them to verify saved payment information or Apple/Android password.  - The patient will need to then log into the app with their MyChart username and  password, and select [facility] as their healthcare provider to link the account. When it is time for your visit, go to the MyChart app, find appointments, and click Begin Video Visit. Be sure to Select Allow for your device to access the Microphone and Camera for your visit. You will then be connected, and your provider will be with you shortly.  **If they have any issues connecting, or need assistance please contact MyChart service desk [phone])**  **If using a computer, in order to ensure the best quality for their visit they will need to use either of the following Internet Browsers: [employer], or Google Chrome**  IF USING DOXIMITY or DOXY.ME - The patient will receive a link just prior to their visit by text.     FULL LENGTH CONSENT FOR TELE-HEALTH VISIT   I hereby voluntarily request, consent and authorize [facility] and its employed or contracted physicians, physician assistants, nurse practitioners or other licensed health care professionals (the Practitioner), to provide me with telemedicine health care services (the "Services") as deemed necessary by the treating Practitioner. I acknowledge and consent to receive the Services by the Practitioner via telemedicine. I understand that the telemedicine visit will involve communicating with the Practitioner through live audiovisual communication technology and the disclosure of certain medical information by electronic transmission. I acknowledge that I have been given the opportunity to request an in-person assessment or other available alternative prior to the telemedicine visit and am voluntarily participating in the telemedicine visit.  I understand that I have the right to withhold or withdraw my consent to the use of telemedicine in the course of my care at any time, without affecting my right to future care or treatment, and that the Practitioner or I may terminate the telemedicine visit at any time. I  understand that I have the right to inspect all information obtained and/or recorded in the course of the telemedicine visit and may receive copies of available information for a reasonable fee.  I understand that some of the potential risks of receiving the Services via telemedicine include:  Marland Kitchen Delay or interruption in medical evaluation due to technological equipment failure or disruption; . Information transmitted may not be sufficient (e.g. poor resolution of images) to allow for appropriate medical decision making by the Practitioner; and/or  . In rare instances, security protocols could fail, causing a breach of personal health information.  Furthermore, I acknowledge that it is my responsibility to provide information about my medical history, conditions and care that is complete and accurate to the best of my ability. I acknowledge that Practitioner's advice, recommendations, and/or decision may be based on factors not within their control, such as incomplete or inaccurate data provided by me or distortions of diagnostic images or specimens that may result from electronic transmissions. I understand that the practice of medicine is not an exact science and that Practitioner makes no  warranties or guarantees regarding treatment outcomes. I acknowledge that I will receive a copy of this consent concurrently upon execution via email to the email address I last provided but may also request a printed copy by calling the office of [facility].    I understand that my insurance will be billed for this visit.   I have read or had this consent read to me. . I understand the contents of this consent, which adequately explains the benefits and risks of the Services being provided via telemedicine.  . I have been provided ample opportunity to ask questions regarding this consent and the Services and have had my questions answered to my satisfaction. . I give my informed consent for the services to be  provided through the use of telemedicine in my medical care  By participating in this telemedicine visit I agree to the above.

## 2018-08-05 ENCOUNTER — Encounter: Payer: Self-pay | Admitting: Internal Medicine

## 2018-08-05 ENCOUNTER — Other Ambulatory Visit: Payer: Self-pay

## 2018-08-05 ENCOUNTER — Telehealth (INDEPENDENT_AMBULATORY_CARE_PROVIDER_SITE_OTHER): Payer: Managed Care, Other (non HMO) | Admitting: Internal Medicine

## 2018-08-05 VITALS — BP 122/61 | HR 67 | Ht 69.0 in | Wt 228.1 lb

## 2018-08-05 DIAGNOSIS — I5042 Chronic combined systolic (congestive) and diastolic (congestive) heart failure: Secondary | ICD-10-CM

## 2018-08-05 DIAGNOSIS — E785 Hyperlipidemia, unspecified: Secondary | ICD-10-CM

## 2018-08-05 DIAGNOSIS — I255 Ischemic cardiomyopathy: Secondary | ICD-10-CM | POA: Diagnosis not present

## 2018-08-05 DIAGNOSIS — I2510 Atherosclerotic heart disease of native coronary artery without angina pectoris: Secondary | ICD-10-CM

## 2018-08-05 NOTE — Progress Notes (Signed)
Virtual Visit via Video Note   This visit type was conducted due to national recommendations for restrictions regarding the COVID-19 Pandemic (e.g. social distancing) in an effort to limit this patient's exposure and mitigate transmission in our community.  Due to her co-morbid illnesses, this patient is at least at moderate risk for complications without adequate follow up.  This format is felt to be most appropriate for this patient at this time.  All issues noted in this document were discussed and addressed.  A limited physical exam was performed with this format.  Please refer to the patient's chart for her consent to telehealth for [facility].   Date:  08/06/2018   ID:  Deanna Moran, DOB 19-Feb-1966, MRN [id number]  Patient Location: Home Provider Location: Home  PCP:  Jodelle Green, [name]  Cardiologist:  Nelva Bush, MD  Electrophysiologist:  None   Evaluation Performed:  Follow-Up Visit  Chief Complaint: Leg swelling  History of Present Illness:    Deanna Moran is a 53 y.o. female with history of CAD with a NSTEMI in 06/2016 that was medically managed, HFrEF secondary to ICM, HTN, HLD, and tobacco abuse.  I last saw her in December, at which time Deanna Moran was doing well other than sinus congestion and rhinorrhea (now resolved).  We decided to discontinue clopidogrel, having completed >12 months of DAPT for medically managed MI.  Today, Deanna Moran reports doing well other than the bilateral foot/leg swelling that began after initiation of gabapentin for treatment of lower extremity neuropathy.  She has been on gabapentin for a few weeks and has noticed progressive swelling in her feet, which now occasionally extends to the ankles and distal calves.  Edema worsened when she began up titration of gabapentin from 100 mg twice daily to her current dosage.  She has also noticed a 7 pound weight gain.  She denies chest pain, shortness of breath, orthopnea, PND, palpitations,  and lightheadedness.  Deanna Moran reports some improvement in her lower extremity neuropathy with gabapentin.  Since our last visit, Deanna Moran was also diagnosed with Crohn's disease on a screening colonoscopy.  She denies any GI symptoms but states that it is difficult to reconcile heart healthy and low fiber diets.  The patient does not have symptoms concerning for COVID-19 infection (fever, chills, cough, or new shortness of breath).    Past Medical History:  Diagnosis Date  . Blood in stool   . Chicken pox   . Chronic back pain   . Coronary artery disease    a. NSTEMI 4/18; b. LHC 4/18: LM nl, ost LAD 100% with L-L & R-L collats, LCx nl, OM3 40%, RCA with mild irregs  . GERD (gastroesophageal reflux disease)   . Heart disease   . HLD (hyperlipidemia)   . HTN (hypertension)   . Ischemic cardiomyopathy    a. echo 4/18: EF 40-45%, ant HK, Gr1DD, mild MR, mild LAE, nl RVSF  . NSTEMI (non-ST elevated myocardial infarction) ([facility]) 06/2016  . Obesity    Past Surgical History:  Procedure Laterality Date  . CARDIAC CATHETERIZATION    . COLONOSCOPY WITH PROPOFOL N/A 05/13/2018   Procedure: COLONOSCOPY WITH PROPOFOL;  Surgeon: Lin Landsman, MD;  Location: [facility] ENDOSCOPY;  Service: Gastroenterology;  Laterality: N/A;  . LEFT HEART CATH AND CORONARY ANGIOGRAPHY N/A 07/11/2016   Procedure: Left Heart Cath and Coronary Angiography;  Surgeon: Nelva Bush, MD;  Location: [facility] CV LAB;  Service: Cardiovascular;  Laterality: N/A;  . TUBAL  LIGATION       Current Meds  Medication Sig  . aspirin 81 MG EC tablet Take 1 tablet (81 mg total) by mouth daily.  . budesonide (ENTOCORT EC) 3 MG 24 hr capsule Take 3 capsules (9 mg total) by mouth daily.  . carvedilol (COREG) 3.125 MG tablet TAKE 1 TABLET BY MOUTH TWO  TIMES DAILY WITH A MEAL  . ezetimibe (ZETIA) 10 MG tablet TAKE 1 TABLET BY MOUTH  DAILY  . gabapentin (NEURONTIN) 100 MG capsule Take 100 mg in the morning and 100 mg in  the afternoon and 200 mg at night  . losartan (COZAAR) 25 MG tablet Take 0.5 tablets (12.5 mg total) by mouth daily.  . naproxen sodium (ANAPROX) 220 MG tablet Take 220 mg by mouth as needed.  . nitroGLYCERIN (NITROSTAT) 0.4 MG SL tablet SEE DIRECTIONS ON  MEDICATION INFORMATION  SHEET WITH INVOICE  PAPERWORK  . prednisoLONE acetate (PRED FORTE) 1 % ophthalmic suspension Place 2 drops into both eyes as needed.   . rosuvastatin (CRESTOR) 40 MG tablet Take 1 tablet (40 mg total) by mouth daily.     Allergies:   Patient has no known allergies.   Social History   Tobacco Use  . Smoking status: Current Every Day Smoker    Packs/day: 0.50    Years: 38.00    Pack years: 19.00    Types: Cigarettes  . Smokeless tobacco: Never Used  Substance Use Topics  . Alcohol use: Yes    Comment: ocassionally  . Drug use: Never     Family Hx: The patient's family history includes Anuerysm in her brother; COPD in her mother; Heart attack in her father.  ROS:   Please see the history of present illness.   All other systems reviewed and are negative.   Prior CV studies:   The following studies were reviewed today:  Cath/PCI:  LHC (07/11/16): Significant single vessel CAD with subacute versus chronic total occlusion of the ostial LAD with left to left and right-to-left collaterals. Mild to moderate nonobstructive CAD involving the LCx and RCA. LVEF 30-35% with anterior and apical wall motion abnormality.  Non-Invasive Evaluation(s):  ABIs (12/14/2017): Normal bilaterally (1.1 on the right, 1.1 on the left).  TTE (07/12/16): Technically difficult study. LVEF 40-45% with anterior hypokinesis and grade 1 diastolic dysfunction. Mild MR. Mild left atrial enlargement. Normal RV size and function.  Labs/Other Tests and Data Reviewed:    EKG:  No ECG reviewed.  Recent Labs: 11/02/2017: BUN 13; Creatinine, Ser 0.82; Hemoglobin 13.5; Platelets 232; Potassium 4.4; Sodium 142 02/25/2018: ALT 49   Recent  Lipid Panel Lab Results  Component Value Date/Time   CHOL 111 02/25/2018 09:12 AM   TRIG 71 02/25/2018 09:12 AM   HDL 35 (L) 02/25/2018 09:12 AM   CHOLHDL 3.2 02/25/2018 09:12 AM   CHOLHDL 4.6 07/11/2016 04:59 AM   LDLCALC 62 02/25/2018 09:12 AM   LDLDIRECT 76 08/30/2016 11:08 AM    Wt Readings from Last 3 Encounters:  08/05/18 228 lb 2 oz (103.5 kg)  05/24/18 222 lb 12.8 oz (101.1 kg)  05/13/18 221 lb (100.2 kg)     Objective:    Vital Signs:  BP 122/61 (BP Location: Left Arm, Patient Position: Sitting, Cuff Size: Normal)   Pulse 67   Ht 5' 9"  (1.753 m)   Wt 228 lb 2 oz (103.5 kg)   BMI 33.69 kg/m    VITAL SIGNS:  reviewed GEN:  no acute distress  ASSESSMENT &  PLAN:    Coronary artery disease: Deanna Moran not have any new symptoms to suggest worsening coronary insufficiency.  We will continue her current medication regimen for secondary prevention.  Smoking cessation encouraged.  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  Certainly gabapentin, particularly at high doses can cause leg edema.  I also wonder if she may have some element of heart failure, though she otherwise does not have symptoms such as dyspnea and orthopnea.  I suggested that we think about using furosemide, at least on an as-needed basis, though Deanna Moran did not wish to pursue this given that she already has urinary frequency.  She will speak with her neurologist, Dr. Melrose Nakayama, about decreasing gabapentin dose.  If edema does not improve, we will need to readdress starting furosemide and consider obtaining an echocardiogram to ensure that her EF has not declined since last assessment at the time of her MI in 06/2016 (LVEF 40-45%).  Hyperlipidemia: LDL at goal (62 on 02/25/2018).  We will continue current regimen of rosuvastatin and ezetimibe.   COVID-19 Education: The signs and symptoms of COVID-19 were discussed with the patient and how to seek care for testing (follow up with PCP or arrange E-visit).  The importance of social distancing was discussed today.  Time:   Today, I have spent 12 minutes with the patient with telehealth technology discussing the above problems.  An additional 10 minutes were spent reviewing the patient's chart and documenting today's encounter.   Medication Adjustments/Labs and Tests Ordered: Current medicines are reviewed at length with the patient today.  Concerns regarding medicines are outlined above.   Tests Ordered: None.  Medication Changes: None.  Disposition:  Follow up in 6 month(s)  Signed, Nelva Bush, MD  08/06/2018 6:39 AM    [facility]

## 2018-08-05 NOTE — Patient Instructions (Signed)
Medication Instructions:  Your physician recommends that you continue on your current medications as directed. Please refer to the Current Medication list given to you today.  If you need a refill on your cardiac medications before your next appointment, please call your pharmacy.   Lab work: none If you have labs (blood work) drawn today and your tests are completely normal, you will receive your results only by: Marland Kitchen MyChart Message (if you have MyChart) OR . A paper copy in the mail If you have any lab test that is abnormal or we need to change your treatment, we will call you to review the results.  Testing/Procedures: none  Follow-Up: At [facility], you and your health needs are our priority.  As part of our continuing mission to provide you with exceptional heart care, we have created designated Provider Care Teams.  These Care Teams include your primary Cardiologist (physician) and Advanced Practice Providers (APPs -  Physician Assistants and Nurse Practitioners) who all work together to provide you with the care you need, when you need it. You will need a follow up appointment in 6 months.  Please call our office 2 months in advance to schedule this appointment.  You may see Nelva Bush, MD or one of the following Advanced Practice Providers on your designated Care Team:   Murray Hodgkins, NP Christell Faith, PA-C . Marrianne Mood, PA-C  Any Other Special Instructions Will Be Listed Below (If Applicable).  Dr End recommends you elevate your legs when you can. You may also purchase over-the-counter compression stockings. You can find these online or at some local stores.  Dr End recommends you speak to your neurologist about you gabapentin if you feel it may be contributing to your swelling.

## 2018-08-27 ENCOUNTER — Ambulatory Visit: Payer: Managed Care, Other (non HMO) | Admitting: Gastroenterology

## 2018-09-04 ENCOUNTER — Ambulatory Visit: Payer: Managed Care, Other (non HMO) | Admitting: Internal Medicine

## 2018-09-24 ENCOUNTER — Ambulatory Visit: Payer: Managed Care, Other (non HMO) | Admitting: Gastroenterology

## 2018-09-24 ENCOUNTER — Other Ambulatory Visit: Payer: Self-pay

## 2018-09-24 ENCOUNTER — Encounter: Payer: Self-pay | Admitting: Gastroenterology

## 2018-09-24 VITALS — BP 153/85 | HR 67 | Temp 99.0°F | Resp 16 | Ht 69.0 in | Wt 240.4 lb

## 2018-09-24 DIAGNOSIS — K5000 Crohn's disease of small intestine without complications: Secondary | ICD-10-CM | POA: Diagnosis not present

## 2018-09-24 NOTE — Progress Notes (Signed)
Cephas Darby, MD [address]  [place], [name], [name], [name], [name]  for consultation & management of new diagnosis of small bowel Crohn's.  She is here to discuss about her colonoscopy results.  She recently underwent screening colonoscopy and was incidentally found to have Crohn's disease of in her terminal ileum, mild in severity.  Patient is currently asymptomatic, reports having 1 bowel movement daily without any abdominal pain, weight loss, rectal bleeding, nausea or vomiting.  She smokes tobacco, used to smoke cigarettes 2 packs/day, currently smoking half pack per day.  Follow-up visit 09/24/2018 Patient has been tolerating budesonide 9 mg daily.  She denies any GI symptoms today.  She gained weight as she is unable to exercise and cannot go out.  She also increased the amount of smoking, she said she has nothing to do other than sitting at home during this pandemic  NSAIDs: Occasional Aleve  Antiplts/Anticoagulants/Anti thrombotics: Aspirin 81 mg  GI Procedures: Colonoscopy 05/13/2018 - Perianal skin tags found on perianal exam. - Aphtha in the terminal ileum. Biopsied. - One 5 mm polyp in the transverse colon, removed with a cold snare. Resected and retrieved. - The distal rectum and anal verge are normal on retroflexion view. - The examination was otherwise normal.  DIAGNOSIS:  A. COLON POLYP, TRANSVERSE; BIOPSIES:  - TUBULAR ADENOMA.  - NEGATIVE FOR HIGH-GRADE DYSPLASIA AND MALIGNANCY.   B. TERMINAL ILEUM, ULCERATIONS; BIOPSY:  - CHRONIC ACTIVE ILEITIS WITH ULCERATION.  - NO GRANULOMAS OR DYSPLASIA IDENTIFIED   Comment:  The changes in the terminal ileum would be consistent with regional  enteritis (Crohn's disease). The differential diagnosis could possibly  include NSAID related mucosal injury.  She denies family history of GI malignancy, inflammatory bowel disease  Past Medical History:  Diagnosis Date  . Blood in stool   . Chicken pox   . Chronic back pain   . Coronary artery disease    a. NSTEMI 4/18; b. LHC 4/18: LM nl, ost LAD 100% with L-L & R-L collats, LCx nl, OM3 40%, RCA with mild irregs  . GERD (gastroesophageal reflux disease)   . Heart disease   . HLD (hyperlipidemia)   . HTN (hypertension)   . Ischemic cardiomyopathy    a. echo 4/18: EF 40-45%, ant HK, Gr1DD, mild MR, mild LAE, nl RVSF  . NSTEMI (non-ST elevated myocardial infarction) ([facility]) 06/2016  . Obesity     Past Surgical History:  Procedure Laterality Date  . CARDIAC CATHETERIZATION    . COLONOSCOPY WITH PROPOFOL N/A 05/13/2018   Procedure: COLONOSCOPY WITH PROPOFOL;  Surgeon: Lin Landsman, MD;  Location: [facility] ENDOSCOPY;  Service: Gastroenterology;  Laterality: N/A;  . LEFT HEART CATH AND CORONARY ANGIOGRAPHY N/A 07/11/2016   Procedure: Left Heart Cath and Coronary Angiography;  Surgeon: Nelva Bush, MD;  Location: [facility] LAB;  Service: Cardiovascular;  Laterality: N/A;  . TUBAL LIGATION      Current Outpatient Medications:  .  aspirin 81 MG EC tablet, Take 1 tablet (81 mg total) by mouth daily., Disp: 90 tablet, Rfl: 3 .  budesonide (ENTOCORT EC) 3 MG 24 hr capsule, TK 3 CS PO D, Disp: , Rfl:  .  carvedilol (COREG) 3.125 MG tablet, TAKE 1 TABLET BY MOUTH TWO  TIMES DAILY WITH A MEAL, Disp: 180 tablet, Rfl: 3 .  ezetimibe (ZETIA) 10 MG tablet, TAKE 1 TABLET BY MOUTH  DAILY, Disp: 90 tablet, Rfl: 3 .  gabapentin (NEURONTIN) 100 MG capsule, Take 100 mg in the morning and 100 mg in the afternoon and 200 mg at night, Disp: , Rfl:  .  losartan (COZAAR) 25 MG tablet, Take 0.5 tablets (12.5 mg  total) by mouth daily., Disp: 45 tablet, Rfl: 3 .  naproxen sodium (ANAPROX) 220 MG tablet, Take 220 mg by mouth as needed., Disp: , Rfl:  .  nitroGLYCERIN (NITROSTAT) 0.4 MG SL tablet, SEE DIRECTIONS ON  MEDICATION INFORMATION  SHEET WITH INVOICE  PAPERWORK, Disp: 25 tablet, Rfl: 1 .  prednisoLONE acetate (PRED FORTE) 1 % ophthalmic suspension, Place 2 drops into both eyes as needed. , Disp: , Rfl:  .  rosuvastatin (CRESTOR) 40 MG tablet, Take 1 tablet (40 mg total) by mouth daily., Disp: 90 tablet, Rfl: 3   Family History  Problem Relation Age of Onset  . Heart attack Father   . COPD Mother   . Anuerysm Brother        brain     Social History   Tobacco Use  . Smoking status: Current Every Day Smoker    Packs/day: 0.50    Years: 38.00    Pack years: 19.00    Types: Cigarettes  . Smokeless tobacco: Never Used  Substance Use Topics  . Alcohol use: Yes    Comment: ocassionally  . Drug use: Never    Allergies as of 09/24/2018  . (No Known Allergies)    Review of Systems:    All systems reviewed and negative except where noted in HPI.   Physical Exam:  BP (!) 153/85 (BP Location: Left Arm, Patient Position: Sitting, Cuff Size: Large)   Pulse 67   Temp 99 F (37.2 C)   Resp 16   Ht 5\' 9"  (1.753 m)   Wt 240 lb 6.4 oz (109 kg)   BMI 35.50 kg/m  No LMP recorded. Patient is postmenopausal.  General:   Alert,  Well-developed, well-nourished, pleasant and cooperative in NAD Head:  Normocephalic and atraumatic. Eyes:  Sclera clear, no icterus.   Conjunctiva pink. Ears:  Normal auditory acuity. Nose:  No deformity, discharge, or lesions. Mouth:  No deformity or lesions,oropharynx pink & moist. Neck:  Supple; no masses or thyromegaly. Lungs:  Respirations even and unlabored.  Clear throughout to auscultation.   No wheezes, crackles, or rhonchi. No acute distress. Heart:  Regular rate and rhythm; no murmurs, clicks, rubs, or gallops. Abdomen:  Normal bowel sounds. Soft,  non-tender and non-distended without masses, hepatosplenomegaly or hernias noted.  No guarding or rebound tenderness.   Rectal: Not performed Msk:  Symmetrical without gross deformities. Good, equal movement & strength bilaterally. Pulses:  Normal pulses noted. Extremities:  No clubbing or edema.  No cyanosis. Neurologic:  Alert and oriented x3;  grossly normal neurologically. Skin:  Intact without significant lesions or rashes. No jaundice. Psych:  Alert and cooperative. Normal mood and affect.  Imaging Studies: No abdominal imaging  Assessment and Plan:   Deanna Moran is a 53 y.o. female with chronic tobacco use, coronary disease, ischemic cardiomyopathy on aspirin 81 mg daily here for follow-up of asymptomatic small bowel Crohn's, mild in  severity after the colonoscopy performed for colon cancer screening.  No evidence of anemia  Decrease budesonide 3 mg to 2 capsules daily Strongly advised about complete abstinence from smoking tobacco which she is not ready yet as she has been smoking for more than 40 years Recommend video capsule endoscopy to assess rest of the small intestine.  Further medical management after the VCE results   Follow up in 6 months   Cephas Darby, MD

## 2018-10-04 ENCOUNTER — Other Ambulatory Visit: Payer: Self-pay

## 2018-10-04 ENCOUNTER — Other Ambulatory Visit
Admission: RE | Admit: 2018-10-04 | Discharge: 2018-10-04 | Disposition: A | Discharge status: Home / Self Care | Payer: Managed Care, Other (non HMO) | Source: Ambulatory Visit | Attending: Gastroenterology | Admitting: Gastroenterology

## 2018-10-04 DIAGNOSIS — Z1159 Encounter for screening for other viral diseases: Secondary | ICD-10-CM | POA: Diagnosis not present

## 2018-10-04 LAB — SARS CORONAVIRUS 2 (TAT 6-24 HRS): SARS Coronavirus 2: NEGATIVE

## 2018-10-08 ENCOUNTER — Encounter
Admission: RE | Disposition: A | Discharge status: Home / Self Care | Payer: Self-pay | Source: Home / Self Care | Attending: Gastroenterology

## 2018-10-08 ENCOUNTER — Other Ambulatory Visit: Payer: Self-pay

## 2018-10-08 ENCOUNTER — Ambulatory Visit
Admission: RE | Admit: 2018-10-08 | Discharge: 2018-10-08 | Disposition: A | Discharge status: Home / Self Care | Payer: Managed Care, Other (non HMO) | Attending: Gastroenterology | Admitting: Gastroenterology

## 2018-10-08 DIAGNOSIS — K5000 Crohn's disease of small intestine without complications: Secondary | ICD-10-CM | POA: Diagnosis not present

## 2018-10-08 HISTORY — PX: GIVENS CAPSULE STUDY: SHX5432

## 2018-10-08 SURGERY — IMAGING PROCEDURE, GI TRACT, INTRALUMINAL, VIA CAPSULE
Anesthesia: General

## 2018-10-09 ENCOUNTER — Encounter: Payer: Self-pay | Admitting: Gastroenterology

## 2018-10-27 ENCOUNTER — Other Ambulatory Visit: Payer: Self-pay | Admitting: Physician Assistant

## 2018-11-12 ENCOUNTER — Encounter: Payer: Self-pay | Admitting: Gastroenterology

## 2019-01-13 ENCOUNTER — Other Ambulatory Visit: Payer: Self-pay | Admitting: Internal Medicine

## 2019-02-03 NOTE — Progress Notes (Signed)
Follow-up Outpatient Visit Date: 02/05/2019  Primary Care Provider: Jodelle Green, [name] [address] [place] [address]  Chief Complaint: Follow-up coronary artery disease and cardiomyopathy  HPI:  Deanna Moran is a 53 y.o. year-old female with history of CAD with a NSTEMI in 06/2016 that was medically managed, HFrEF secondary to ICM, HTN, HLD, Crohn's disease, and tobacco abuse, who presents for follow-up of coronary artery disease.  We last spoke via virtual visit in May, at which time Deanna Moran reported bilateral foot and ankle swelling after initiation of gabapentin.  I encouraged her to speak with her neurologist about decreasing/stopping gabapentin.  We deferred repeating an echo and restarting furosemide at that time.  Today, Deanna Moran reports that she feels fairly similar to our last visit.  She has stable exertional dyspnea.  She has not had any chest pain, palpitations, or lightheadedness.  She notes some worsening in her dependent leg edema, primarily because she is sitting at home doing remote work all day.  She does not exercise regularly, which she believes is responsible for her 25 pound weight gain since March.  She is tolerating her medications well.  --------------------------------------------------------------------------------------------------  Past Medical History:  Diagnosis Date  . Blood in stool   . Chicken pox   . Chronic back pain   . Coronary artery disease    a. NSTEMI 4/18; b. LHC 4/18: LM nl, ost LAD 100% with L-L & R-L collats, LCx nl, OM3 40%, RCA with mild irregs  . GERD (gastroesophageal reflux disease)   . Heart disease   . HLD (hyperlipidemia)   . HTN (hypertension)   . Ischemic cardiomyopathy    a. echo 4/18: EF 40-45%, ant HK, Gr1DD, mild MR, mild LAE, nl RVSF  . NSTEMI (non-ST elevated myocardial infarction) ([facility]) 06/2016  . Obesity    Past Surgical History:  Procedure Laterality Date  . CARDIAC CATHETERIZATION    .  COLONOSCOPY WITH PROPOFOL N/A 05/13/2018   Procedure: COLONOSCOPY WITH PROPOFOL;  Surgeon: Lin Landsman, MD;  Location: [facility] ENDOSCOPY;  Service: Gastroenterology;  Laterality: N/A;  . GIVENS CAPSULE STUDY N/A 10/08/2018   Procedure: GIVENS CAPSULE STUDY;  Surgeon: Lin Landsman, MD;  Location: [facility] ENDOSCOPY;  Service: Gastroenterology;  Laterality: N/A;  . LEFT HEART CATH AND CORONARY ANGIOGRAPHY N/A 07/11/2016   Procedure: Left Heart Cath and Coronary Angiography;  Surgeon: Nelva Bush, MD;  Location: [place] [facility];  Service: Cardiovascular;  Laterality: N/A;  . TUBAL LIGATION      Current Meds  Medication Sig  . ASPIRIN LOW DOSE 81 MG EC tablet TAKE 1 TABLET BY MOUTH  DAILY  . budesonide (ENTOCORT EC) 3 MG 24 hr capsule TK 3 CS PO D  . carvedilol (COREG) 3.125 MG tablet TAKE 1 TABLET BY MOUTH TWO  TIMES DAILY WITH A MEAL  . ezetimibe (ZETIA) 10 MG tablet TAKE 1 TABLET BY MOUTH  DAILY  . gabapentin (NEURONTIN) 100 MG capsule Take 100 mg in the morning and 100 mg in the afternoon and 200 mg at night  . losartan (COZAAR) 25 MG tablet TAKE ONE-HALF TABLET BY  MOUTH DAILY  . naproxen sodium (ANAPROX) 220 MG tablet Take 220 mg by mouth as needed.  . nitroGLYCERIN (NITROSTAT) 0.4 MG SL tablet DISSOLVE 1 TABLET ON THE  TONGUE EVERY 5 MINS AS  NEEDED FOR CHEST PAIN MAX 3 TABS IN 15 MINUTES, CALL  911 IF CHEST PAIN PERSISTS  . prednisoLONE acetate (PRED FORTE) 1 %  ophthalmic suspension Place 2 drops into both eyes as needed.   . rosuvastatin (CRESTOR) 40 MG tablet Take 1 tablet (40 mg total) by mouth daily.    Allergies: Patient has no known allergies.  Social History   Tobacco Use  . Smoking status: Current Every Day Smoker    Packs/day: 0.50    Years: 38.00    Pack years: 19.00    Types: Cigarettes  . Smokeless tobacco: Never Used  Substance Use Topics  . Alcohol use: Yes    Comment: ocassionally  . Drug use: Never    Family History  Problem Relation Age of  Onset  . Heart attack Father   . COPD Mother   . Anuerysm Brother        brain    Review of Systems: A 12-system review of systems was performed and was negative except as noted in the HPI.  --------------------------------------------------------------------------------------------------  Physical Exam: BP 140/64 (BP Location: Left Arm, Patient Position: Sitting, Cuff Size: Normal)   Pulse 85   Ht 5' 10"  (1.778 m)   Wt 247 lb (112 kg)   SpO2 98%   BMI 35.44 kg/m   General: NAD. HEENT: No conjunctival pallor or scleral icterus. Moist mucous membranes.  OP clear. Neck: Supple without lymphadenopathy, thyromegaly, JVD, or HJR. No carotid bruit. Lungs: Normal work of breathing. Clear to auscultation bilaterally without wheezes or crackles. Heart: Regular rate and rhythm without murmurs, rubs, or gallops. Non-displaced PMI. Abd: Bowel sounds present. Soft, NT/ND without hepatosplenomegaly Ext: 1+ pretibial edema bilaterally. Radial, PT, and DP pulses are 2+ bilaterally. Skin: Warm and dry without rash.  EKG: Normal sinus rhythm with right axis deviation and poor R wave progression.  No significant change from prior tracing on 02/27/2018.  Lab Results  Component Value Date   WBC 5.7 11/02/2017   HGB 13.5 11/02/2017   HCT 40.8 11/02/2017   MCV 88 11/02/2017   PLT 232 11/02/2017    Lab Results  Component Value Date   NA 142 11/02/2017   K 4.4 11/02/2017   CL 104 11/02/2017   CO2 25 11/02/2017   BUN 13 11/02/2017   CREATININE 0.82 11/02/2017   GLUCOSE 105 (H) 11/02/2017   ALT 49 (H) 02/25/2018    Lab Results  Component Value Date   CHOL 111 02/25/2018   HDL 35 (L) 02/25/2018   LDLCALC 62 02/25/2018   LDLDIRECT 76 08/30/2016   TRIG 71 02/25/2018   CHOLHDL 3.2 02/25/2018    --------------------------------------------------------------------------------------------------  ASSESSMENT AND PLAN: Coronary artery disease: Deanna Moran has not had any chest pain.   She reports that her chronic exertional dyspnea is stable.  We will defer additional testing at this time.  Continue aspirin, carvedilol, ezetimibe, and rosuvastatin for secondary prevention.  Smoking cessation encouraged.  Chronic heart failure with reduced ejection fraction: Deanna Moran reports stable exertional dyspnea but has put on 25 pounds since March.  She also has more pronounced lower extremity edema.  Some of this could certainly be due to dietary indiscretion and being more sedentary in the setting of COVID-19.  Blood pressure is mildly elevated today.  We will therefore add HCTZ 25 mg daily.  I will check a CMP today.  She will also need a BMP in 1 month.  I encouraged Deanna Moran to limit her sodium intake.  We will also check a CBC in the setting of chronic antiplatelet therapy and exertional dyspnea.  Hypertension: Blood pressure is not well controlled today.  Given edema  and chronic exertional dyspnea in the setting of ischemic cardiomyopathy, we have agreed to add HCTZ 25 mg daily.  I will check a CMP today and a BMP in 1 month.  Hyperlipidemia: Deanna Moran is tolerating rosuvastatin and ezetimibe well.  We will check a CMP and lipid panel today to ensure adequate response.  Influenza immunization: Seasonal flu vaccine administered at the patient's request.  Follow-up: Return to clinic in 3 months.  Nelva Bush, MD 02/05/2019 10:42 AM

## 2019-02-05 ENCOUNTER — Encounter: Payer: Self-pay | Admitting: Internal Medicine

## 2019-02-05 ENCOUNTER — Other Ambulatory Visit: Payer: Self-pay

## 2019-02-05 ENCOUNTER — Ambulatory Visit (INDEPENDENT_AMBULATORY_CARE_PROVIDER_SITE_OTHER): Payer: Managed Care, Other (non HMO) | Admitting: Internal Medicine

## 2019-02-05 VITALS — BP 140/64 | HR 85 | Ht 70.0 in | Wt 247.0 lb

## 2019-02-05 DIAGNOSIS — I2510 Atherosclerotic heart disease of native coronary artery without angina pectoris: Secondary | ICD-10-CM | POA: Diagnosis not present

## 2019-02-05 DIAGNOSIS — I5022 Chronic systolic (congestive) heart failure: Secondary | ICD-10-CM | POA: Diagnosis not present

## 2019-02-05 DIAGNOSIS — Z23 Encounter for immunization: Secondary | ICD-10-CM

## 2019-02-05 DIAGNOSIS — I10 Essential (primary) hypertension: Secondary | ICD-10-CM

## 2019-02-05 DIAGNOSIS — E785 Hyperlipidemia, unspecified: Secondary | ICD-10-CM

## 2019-02-05 MED ORDER — HYDROCHLOROTHIAZIDE 25 MG PO TABS: 25.0000 mg | ORAL_TABLET | Freq: Every day | ORAL | 3 refills | Status: DC

## 2019-02-05 NOTE — Patient Instructions (Signed)
Medication Instructions:  Your physician has recommended you make the following change in your medication:  1- START HCTZ (Hydrochlorothiazide) 25 mg (1 tablet) by mouth once a day.  *If you need a refill on your cardiac medications before your next appointment, please call your pharmacy*  Lab Work: 1) Your physician recommends that you return for lab work in: TODAY - CBC no diff, CMP, LIPID.  2) Your physician recommends that you return for lab work in: [place] 03/07/19. BMP.  If you have labs (blood work) drawn today and your tests are completely normal, you will receive your results only by: Marland Kitchen MyChart Message (if you have MyChart) OR . A paper copy in the mail If you have any lab test that is abnormal or we need to change your treatment, we will call you to review the results.  Testing/Procedures: none  Follow-Up: At [facility], you and your health needs are our priority.  As part of our continuing mission to provide you with exceptional heart care, we have created designated Provider Care Teams.  These Care Teams include your primary Cardiologist (physician) and Advanced Practice Providers (APPs -  Physician Assistants and Nurse Practitioners) who all work together to provide you with the care you need, when you need it.  Your next appointment:   3 month(s)  The format for your next appointment:   In Person  Provider:    You may see Nelva Bush, MD or one of the following Advanced Practice Providers on your designated Care Team:    Murray Hodgkins, NP  Christell Faith, PA-C  Marrianne Mood, PA-C

## 2019-02-06 ENCOUNTER — Encounter: Payer: Self-pay | Admitting: Internal Medicine

## 2019-02-06 LAB — COMPREHENSIVE METABOLIC PANEL
ALT: 19 IU/L (ref 0–32)
AST: 14 IU/L (ref 0–40)
Albumin/Globulin Ratio: 1.9 (ref 1.2–2.2)
Albumin: 4.2 g/dL (ref 3.8–4.9)
Alkaline Phosphatase: 85 IU/L (ref 39–117)
BUN/Creatinine Ratio: 18 (ref 9–23)
BUN: 14 mg/dL (ref 6–24)
Bilirubin Total: 0.4 mg/dL (ref 0.0–1.2)
CO2: 25 mmol/L (ref 20–29)
Calcium: 9.1 mg/dL (ref 8.7–10.2)
Chloride: 103 mmol/L (ref 96–106)
Creatinine, Ser: 0.77 mg/dL (ref 0.57–1.00)
GFR calc Af Amer: 101 mL/min/{1.73_m2} (ref >59)
GFR calc non Af Amer: 88 mL/min/{1.73_m2} (ref >59)
Globulin, Total: 2.2 g/dL (ref 1.5–4.5)
Glucose: 116 mg/dL — ABNORMAL HIGH (ref 65–99)
Potassium: 3.9 mmol/L (ref 3.5–5.2)
Sodium: 142 mmol/L (ref 134–144)
Total Protein: 6.4 g/dL (ref 6.0–8.5)

## 2019-02-06 LAB — CBC
Hematocrit: 43.5 % (ref 34.0–46.6)
Hemoglobin: 14.2 g/dL (ref 11.1–15.9)
MCH: 28.3 pg (ref 26.6–33.0)
MCHC: 32.6 g/dL (ref 31.5–35.7)
MCV: 87 fL (ref 79–97)
Platelets: 167 10*3/uL (ref 150–450)
RBC: 5.02 x10E6/uL (ref 3.77–5.28)
RDW: 12.6 % (ref 11.7–15.4)
WBC: 6.1 10*3/uL (ref 3.4–10.8)

## 2019-02-06 LAB — LIPID PANEL
Chol/HDL Ratio: 2.9 ratio (ref 0.0–4.4)
Cholesterol, Total: 135 mg/dL (ref 100–199)
HDL: 46 mg/dL (ref >39)
LDL Chol Calc (NIH): 75 mg/dL (ref 0–99)
Triglycerides: 66 mg/dL (ref 0–149)
VLDL Cholesterol Cal: 14 mg/dL (ref 5–40)

## 2019-03-04 ENCOUNTER — Other Ambulatory Visit: Payer: Self-pay | Admitting: Internal Medicine

## 2019-03-05 ENCOUNTER — Other Ambulatory Visit: Payer: Self-pay | Admitting: Physician Assistant

## 2019-03-05 ENCOUNTER — Other Ambulatory Visit: Payer: Self-pay | Admitting: Internal Medicine

## 2019-03-08 LAB — CBC
Hematocrit: 43.6 % (ref 34.0–46.6)
Hemoglobin: 14.5 g/dL (ref 11.1–15.9)
MCH: 28.7 pg (ref 26.6–33.0)
MCHC: 33.3 g/dL (ref 31.5–35.7)
MCV: 86 fL (ref 79–97)
Platelets: 191 10*3/uL (ref 150–450)
RBC: 5.05 x10E6/uL (ref 3.77–5.28)
RDW: 12.2 % (ref 11.7–15.4)
WBC: 7.9 10*3/uL (ref 3.4–10.8)

## 2019-03-08 LAB — COMPREHENSIVE METABOLIC PANEL
ALT: 20 IU/L (ref 0–32)
AST: 17 IU/L (ref 0–40)
Albumin/Globulin Ratio: 1.9 (ref 1.2–2.2)
Albumin: 4.4 g/dL (ref 3.8–4.9)
Alkaline Phosphatase: 92 IU/L (ref 39–117)
BUN/Creatinine Ratio: 19 (ref 9–23)
BUN: 19 mg/dL (ref 6–24)
Bilirubin Total: 0.5 mg/dL (ref 0.0–1.2)
CO2: 29 mmol/L (ref 20–29)
Calcium: 9.4 mg/dL (ref 8.7–10.2)
Chloride: 96 mmol/L (ref 96–106)
Creatinine, Ser: 0.98 mg/dL (ref 0.57–1.00)
GFR calc Af Amer: 76 mL/min/{1.73_m2} (ref >59)
GFR calc non Af Amer: 66 mL/min/{1.73_m2} (ref >59)
Globulin, Total: 2.3 g/dL (ref 1.5–4.5)
Glucose: 86 mg/dL (ref 65–99)
Potassium: 3.7 mmol/L (ref 3.5–5.2)
Sodium: 140 mmol/L (ref 134–144)
Total Protein: 6.7 g/dL (ref 6.0–8.5)

## 2019-03-08 LAB — LIPID PANEL
Chol/HDL Ratio: 2.5 ratio (ref 0.0–4.4)
Cholesterol, Total: 108 mg/dL (ref 100–199)
HDL: 43 mg/dL (ref >39)
LDL Chol Calc (NIH): 50 mg/dL (ref 0–99)
Triglycerides: 69 mg/dL (ref 0–149)
VLDL Cholesterol Cal: 15 mg/dL (ref 5–40)

## 2019-05-08 ENCOUNTER — Other Ambulatory Visit: Payer: Self-pay

## 2019-05-08 ENCOUNTER — Telehealth (INDEPENDENT_AMBULATORY_CARE_PROVIDER_SITE_OTHER): Payer: Managed Care, Other (non HMO) | Admitting: Internal Medicine

## 2019-05-08 ENCOUNTER — Encounter: Payer: Self-pay | Admitting: Internal Medicine

## 2019-05-08 VITALS — BP 146/84 | HR 73 | Ht 69.75 in | Wt 249.0 lb

## 2019-05-08 DIAGNOSIS — I2510 Atherosclerotic heart disease of native coronary artery without angina pectoris: Secondary | ICD-10-CM | POA: Diagnosis not present

## 2019-05-08 DIAGNOSIS — Z720 Tobacco use: Secondary | ICD-10-CM

## 2019-05-08 DIAGNOSIS — I10 Essential (primary) hypertension: Secondary | ICD-10-CM

## 2019-05-08 DIAGNOSIS — E785 Hyperlipidemia, unspecified: Secondary | ICD-10-CM | POA: Diagnosis not present

## 2019-05-08 DIAGNOSIS — I5022 Chronic systolic (congestive) heart failure: Secondary | ICD-10-CM | POA: Diagnosis not present

## 2019-05-08 MED ORDER — VARENICLINE TARTRATE 1 MG PO TABS: 1.0000 mg | ORAL_TABLET | Freq: Two times a day (BID) | ORAL | 3 refills | Status: DC

## 2019-05-08 MED ORDER — CHANTIX STARTING MONTH PAK 0.5 MG X 11 & 1 MG X 42 PO TABS: ORAL_TABLET | ORAL | 0 refills | Status: DC

## 2019-05-08 NOTE — Patient Instructions (Signed)
Medication Instructions:  Your physician has recommended you make the following change in your medication:  1- CHANTIX starter pack and additional refills as directed.   *If you need a refill on your cardiac medications before your next appointment, please call your pharmacy*  Lab Work: none If you have labs (blood work) drawn today and your tests are completely normal, you will receive your results only by: Marland Kitchen MyChart Message (if you have MyChart) OR . A paper copy in the mail If you have any lab test that is abnormal or we need to change your treatment, we will call you to review the results.  Testing/Procedures: none  Follow-Up: At [facility], you and your health needs are our priority.  As part of our continuing mission to provide you with exceptional heart care, we have created designated Provider Care Teams.  These Care Teams include your primary Cardiologist (physician) and Advanced Practice Providers (APPs -  Physician Assistants and Nurse Practitioners) who all work together to provide you with the care you need, when you need it.  Your next appointment:   4 month(s)  - Already scheduled.   The format for your next appointment:   In Person  Provider:    You may see Nelva Bush, MD or one of the following Advanced Practice Providers on your designated Care Team:    Murray Hodgkins, NP  Christell Faith, PA-C  Marrianne Mood, PA-C

## 2019-05-08 NOTE — Progress Notes (Signed)
Virtual Visit via Video Note   This visit type was conducted due to national recommendations for restrictions regarding the COVID-19 Pandemic (e.g. social distancing) in an effort to limit this patient's exposure and mitigate transmission in our community.  Due to her co-morbid illnesses, this patient is at least at moderate risk for complications without adequate follow up.  This format is felt to be most appropriate for this patient at this time.  All issues noted in this document were discussed and addressed.  A limited physical exam was performed with this format.  Please refer to the patient's chart for her consent to telehealth for [facility].   Date:  05/08/2019   ID:  Deanna Moran, DOB [birth year], MRN [id number]  Patient Location: Home Provider Location: Home  PCP:  Jodelle Green, [name]  Cardiologist:  Nelva Bush, MD  Electrophysiologist:  None   Evaluation Performed:  Follow-Up Visit  Chief Complaint:  Follow-up coronary artery disease and heart failure  History of Present Illness:    Deanna Moran is a 54 y.o. female with history of CAD with a NSTEMI in 06/2016 that was medically managed, HFrEF secondary to ICM, HTN, HLD, Crohn's disease, and tobacco abuse.  I last saw Deanna Moran in 01/2019, at which time she reported stable exertional dyspnea without chest pain.  Dependent leg edema had worsened, which she attributed to sitting at home most days.  She had gained ~25 pounds since 05/2018 due to inactivity.  We agreed to add HCTZ 25 mg daily to improve blood pressure control and edema.  Today, Deanna Moran reports that she has not had any chest pain.  She has stable exertional dyspnea that she attributes to inactivity and weight gain since the start of the COVID-19 pandemic.  Lower extremity edema has been well controlled.  She has been bothered by increasing neuropathy in her legs.  Blood pressures typically 1 69-4 30 systolic.  She is interested in trying Chantix again  to help her quit smoking.  She tried it 10 years ago but experienced nightmares.  The patient does not have symptoms concerning for COVID-19 infection (fever, chills, cough, or new shortness of breath).    Past Medical History:  Diagnosis Date  . Blood in stool   . Chicken pox   . Chronic back pain   . Coronary artery disease    a. NSTEMI 4/18; b. LHC 4/18: LM nl, ost LAD 100% with L-L & R-L collats, LCx nl, OM3 40%, RCA with mild irregs  . GERD (gastroesophageal reflux disease)   . Heart disease   . HLD (hyperlipidemia)   . HTN (hypertension)   . Ischemic cardiomyopathy    a. echo 4/18: EF 40-45%, ant HK, Gr1DD, mild MR, mild LAE, nl RVSF  . NSTEMI (non-ST elevated myocardial infarction) ([place]) 06/2016  . Obesity    Past Surgical History:  Procedure Laterality Date  . CARDIAC CATHETERIZATION    . COLONOSCOPY WITH PROPOFOL N/A 05/13/2018   Procedure: COLONOSCOPY WITH PROPOFOL;  Surgeon: Lin Landsman, MD;  Location: [facility] ENDOSCOPY;  Service: Gastroenterology;  Laterality: N/A;  . GIVENS CAPSULE STUDY N/A 10/08/2018   Procedure: GIVENS CAPSULE STUDY;  Surgeon: Lin Landsman, MD;  Location: [facility] ENDOSCOPY;  Service: Gastroenterology;  Laterality: N/A;  . LEFT HEART CATH AND CORONARY ANGIOGRAPHY N/A 07/11/2016   Procedure: Left Heart Cath and Coronary Angiography;  Surgeon: Nelva Bush, MD;  Location: [place] CV LAB;  Service: Cardiovascular;  Laterality: N/A;  . TUBAL LIGATION  Current Meds  Medication Sig  . ASPIRIN LOW DOSE 81 MG EC tablet TAKE 1 TABLET BY MOUTH  DAILY  . carvedilol (COREG) 3.125 MG tablet TAKE 1 TABLET BY MOUTH TWO  TIMES DAILY WITH MEALS  . ezetimibe (ZETIA) 10 MG tablet TAKE 1 TABLET BY MOUTH  DAILY  . gabapentin (NEURONTIN) 100 MG capsule Take 100 mg in the am & 200 mg  in the pm  . hydrochlorothiazide (HYDRODIURIL) 25 MG tablet Take 1 tablet (25 mg total) by mouth daily.  Marland Kitchen losartan (COZAAR) 25 MG tablet TAKE ONE-HALF TABLET BY   MOUTH DAILY  . naproxen sodium (ANAPROX) 220 MG tablet Take 220 mg by mouth as needed.  . nitroGLYCERIN (NITROSTAT) 0.4 MG SL tablet DISSOLVE 1 TABLET ON THE  TONGUE EVERY 5 MINS AS  NEEDED FOR CHEST PAIN MAX 3 TABS IN 15 MINUTES, CALL  911 IF CHEST PAIN PERSISTS  . rosuvastatin (CRESTOR) 40 MG tablet TAKE 1 TABLET BY MOUTH  DAILY     Allergies:   Patient has no known allergies.   Social History   Tobacco Use  . Smoking status: Current Every Day Smoker    Packs/day: 0.50    Years: 38.00    Pack years: 19.00    Types: Cigarettes  . Smokeless tobacco: Never Used  Substance Use Topics  . Alcohol use: Yes    Comment: ocassionally  . Drug use: Never     Family Hx: The patient's family history includes Anuerysm in her brother; COPD in her mother; Heart attack in her father.  ROS:   Please see the history of present illness.   All other systems reviewed and are negative.   Prior CV studies:   The following studies were reviewed today:  Cath/PCI:  LHC (07/11/16): Significant single vessel CAD with subacute versus chronic total occlusion of the ostial LAD with left to left and right-to-left collaterals. Mild to moderate nonobstructive CAD involving the LCx and RCA. LVEF 30-35% with anterior and apical wall motion abnormality.  Non-Invasive Evaluation(s):  ABIs (12/14/2017): Normal bilaterally (1.1 on the right, 1.1 on the left).  TTE (07/12/16): Technically difficult study. LVEF 40-45% with anterior hypokinesis and grade 1 diastolic dysfunction. Mild MR. Mild left atrial enlargement. Normal RV size and function.  Labs/Other Tests and Data Reviewed:    EKG:  No ECG reviewed.  Recent Labs: 03/07/2019: ALT 20; BUN 19; Creatinine, Ser 0.98; Hemoglobin 14.5; Platelets 191; Potassium 3.7; Sodium 140   Recent Lipid Panel Lab Results  Component Value Date/Time   CHOL 108 03/07/2019 08:37 AM   TRIG 69 03/07/2019 08:37 AM   HDL 43 03/07/2019 08:37 AM   CHOLHDL 2.5 03/07/2019 08:37  AM   CHOLHDL 4.6 07/11/2016 04:59 AM   LDLCALC 50 03/07/2019 08:37 AM   LDLDIRECT 76 08/30/2016 11:08 AM    Wt Readings from Last 3 Encounters:  05/08/19 249 lb (112.9 kg)  02/05/19 247 lb (112 kg)  09/24/18 240 lb 6.4 oz (109 kg)     Objective:    Vital Signs:  BP (!) 146/84   Pulse 73   Ht 5' 9.75" (1.772 m)   Wt 249 lb (112.9 kg)   BMI 35.98 kg/m    VITAL SIGNS:  reviewed GEN:  no acute distress  ASSESSMENT & PLAN:    Coronary artery disease: No symptoms of worsening coronary insufficiency.  Continue current medications for secondary prevention.  Chronic HFrEF: Leg edema improved.  Chronic exertional dyspnea stable and likely driven by combination  of heart failure, deconditioning, and obesity.  Continue current medications, including carvedilol, losartan, and HCTZ.  Hypertension: Blood pressure mildly elevated today but typically better at home.  Continue current regimen of carvedilol, HCTZ, and losartan.  Hyperlipidemia: LDL at goal on last check in 02/2019 (50).  Continue current regimen of ezetimibe and rosuvastatin.  Tobacco use: Deanna Moran is interested in quitting.  She would like to try Chantix, which I think is reasonable.  We discussed potential side effects.  Morbid obesity: BMI greater than 35 with multiple comorbidities (CAD, HFrEF, hypertension, and hyperlipidemia): Weight loss recommended through diet and exercise.  COVID-19 Education: The signs and symptoms of COVID-19 were discussed with the patient and how to seek care for testing (follow up with PCP or arrange E-visit).  The importance of social distancing was discussed today.  Time:   Today, I have spent 18 minutes with the patient with telehealth technology discussing the above problems.     Medication Adjustments/Labs and Tests Ordered: Current medicines are reviewed at length with the patient today.  Concerns regarding medicines are outlined above.   Tests Ordered: None.  Medication  Changes: Meds ordered this encounter  Medications  . varenicline (CHANTIX STARTING MONTH PAK) 0.5 MG X 11 & 1 MG X 42 tablet    Sig: Take one 0.5 mg tablet by mouth once daily for 3 days, then increase to one 0.5 mg tablet twice daily for 4 days, then increase to one 1 mg tablet twice daily.    Dispense:  53 tablet    Refill:  0  . varenicline (CHANTIX CONTINUING MONTH PAK) 1 MG tablet    Sig: Take 1 tablet (1 mg total) by mouth 2 (two) times daily.    Dispense:  60 tablet    Refill:  3    Follow Up:  In Person in 4 month(s)  Signed, Nelva Bush, MD  05/08/2019 11:04 AM    [place]

## 2019-05-09 ENCOUNTER — Telehealth: Payer: Self-pay

## 2019-05-09 NOTE — Telephone Encounter (Addendum)
Spoke with the patient. Advised her that Chantix has been denied by her insurance and listed the medication alternatives below.  Patients sts that she has tried the Nicotine patch in the past and has not been able to tolerate due to rash. She would consider Bupropion. She would like to know if she can take the medication and gradually reduce her smoking until she is able to stop. She does not think she would be able to stop smoking cold [place]. Advised the patient that the bupropion would help with the nicotine withdrawals, not sure if it curbs cravings.  Advised her that I will fwd her question to Dr. Saunders Revel and we will give her a call back with his response. Patient agreeable with the plan.

## 2019-05-09 NOTE — Telephone Encounter (Signed)
Chantix not covered by patient's insurance plan. Covered alternatives are: Nicotine 56m, 152m or 2141m4hr patch Bupropion Hcl Nicotrol  Please advise.

## 2019-05-09 NOTE — Telephone Encounter (Signed)
To Dr. Saunders Revel to review.

## 2019-05-09 NOTE — Telephone Encounter (Signed)
I will defer to the patient if she wishes to try one of these alternatives.  Nicotine replacement can be purchased from the pharmacy without a prescription.  She should let us know if she wishes to try bupropion.  Nelva Bush, MD [facility]

## 2019-05-12 MED ORDER — BUPROPION HCL ER (SR) 150 MG PO TB12: ORAL_TABLET | ORAL | 2 refills | Status: DC

## 2019-05-12 NOTE — Telephone Encounter (Signed)
Discussed other smoking cessation options with Ms. Deanna Moran.  She has not tolerated nicotine patches and gum well in the past.  She is agreeable to a trial of bupropion.  She says that she may have had a "seizure" once while getting a throat swab done, but she was told by the MD at the time that she probably had transient drop in blood pressure.  She did not have true seizure activity nor has she ever had any other seizures.  Ms. Deanna Moran would like to try bupropion for smoking cessation.  If she has side effects or is unable to quit, we will try to get approval for Chantix again.  Deanna Bush, MD [facility]

## 2019-05-12 NOTE — Addendum Note (Signed)
Addended by: Carlisle Enke A on: 05/12/2019 09:31 AM   Modules accepted: Orders

## 2019-05-25 ENCOUNTER — Other Ambulatory Visit: Payer: Self-pay | Admitting: Internal Medicine

## 2019-05-25 ENCOUNTER — Other Ambulatory Visit: Payer: Self-pay | Admitting: Physician Assistant

## 2019-08-01 ENCOUNTER — Ambulatory Visit: Payer: Managed Care, Other (non HMO) | Attending: Internal Medicine

## 2019-08-01 DIAGNOSIS — Z23 Encounter for immunization: Secondary | ICD-10-CM

## 2019-08-01 NOTE — Progress Notes (Signed)
   Covid-19 Vaccination Clinic  Name:  Deanna Moran    MRN: [id number] DOB: 20-May-1965  08/01/2019  [name] was observed post Covid-19 immunization for 15 minutes without incident. She was provided with Vaccine Information Sheet and instruction to access the V-Safe system.   [name] was instructed to call 911 with any severe reactions post vaccine: Marland Kitchen Difficulty breathing  . Swelling of face and throat  . A fast heartbeat  . A bad rash all over body  . Dizziness and weakness   Immunizations Administered    Name Date Dose VIS Date Route   Pfizer COVID-19 Vaccine 08/01/2019 10:36 AM 0.3 mL 05/14/2018 Intramuscular   Manufacturer: [place]   Lot: [id number]   [place]: [id number]

## 2019-08-29 ENCOUNTER — Ambulatory Visit: Payer: Managed Care, Other (non HMO) | Attending: Internal Medicine

## 2019-08-29 DIAGNOSIS — Z23 Encounter for immunization: Secondary | ICD-10-CM

## 2019-08-29 NOTE — Progress Notes (Signed)
   Covid-19 Vaccination Clinic  Name:  Deanna Moran    MRN: [id number] DOB: 1965/05/21  08/29/2019  [name] was observed post Covid-19 immunization for 15 minutes without incident. She was provided with Vaccine Information Sheet and instruction to access the V-Safe system.   [name] was instructed to call 911 with any severe reactions post vaccine: Marland Kitchen Difficulty breathing  . Swelling of face and throat  . A fast heartbeat  . A bad rash all over body  . Dizziness and weakness   Immunizations Administered    Name Date Dose VIS Date Route   Pfizer COVID-19 Vaccine 08/29/2019 10:41 AM 0.3 mL 05/14/2018 Intramuscular   Manufacturer: [place]   Lot: [id number]   [place]: [id number]

## 2019-09-13 ENCOUNTER — Other Ambulatory Visit: Payer: Self-pay | Admitting: Internal Medicine

## 2019-09-17 ENCOUNTER — Other Ambulatory Visit: Payer: Self-pay

## 2019-09-17 ENCOUNTER — Encounter: Payer: Self-pay | Admitting: Internal Medicine

## 2019-09-17 ENCOUNTER — Ambulatory Visit: Payer: Managed Care, Other (non HMO) | Admitting: Internal Medicine

## 2019-09-17 VITALS — BP 140/80 | HR 83 | Ht 69.0 in | Wt 247.4 lb

## 2019-09-17 DIAGNOSIS — I2510 Atherosclerotic heart disease of native coronary artery without angina pectoris: Secondary | ICD-10-CM | POA: Diagnosis not present

## 2019-09-17 DIAGNOSIS — I5022 Chronic systolic (congestive) heart failure: Secondary | ICD-10-CM | POA: Diagnosis not present

## 2019-09-17 DIAGNOSIS — I10 Essential (primary) hypertension: Secondary | ICD-10-CM | POA: Diagnosis not present

## 2019-09-17 DIAGNOSIS — E785 Hyperlipidemia, unspecified: Secondary | ICD-10-CM

## 2019-09-17 DIAGNOSIS — Z720 Tobacco use: Secondary | ICD-10-CM

## 2019-09-17 MED ORDER — BUPROPION HCL ER (SR) 150 MG PO TB12: 150.0000 mg | ORAL_TABLET | Freq: Two times a day (BID) | ORAL | 0 refills | Status: DC

## 2019-09-17 NOTE — Progress Notes (Signed)
Follow-up Outpatient Visit Date: 09/17/2019  Primary Care Provider: Jodelle Green, [name] No address on file  Chief Complaint: Follow-up coronary artery disease and chronic HFrEF  HPI:  Ms. Wojtas is a 53 y.o. female with history of CAD with a NSTEMI in 06/2016 that was medically managed, HFrEF secondary to ICM, HTN, HLD,Crohn's disease,and tobacco abuse, who presents for follow-up of coronary artery disease and cardiomyopathy.  We last spoke in February via virtual visit, at which time Ms. Cott reported stable exertional dyspnea which she attributed to inactivity and weight gain during the COVID-19 pandemic.  She noted increasing neuropathy in both legs as well.  We agreed to initiate Chantix to aid with smoking cessation.  However, this was not affordable and we subsequently decided to try bupropion.  No other medication changes or additional testing were pursued.  Today, Ms. Kitamura reports feeling about the same, with stable exertional dyspnea.  She has not been very active, working 14 hours a day most days.  She denies chest pain, palpitations, lightheadedness, and edema.  She is tolerating bupropion and notes that it has helped with her some of her depression.  Unfortunately, she continues to smoke about 1/2 pack/day.  Financial stress, including her son's recent job loss, have made it more difficult for her to quit.  --------------------------------------------------------------------------------------------------  Past Medical History:  Diagnosis Date  . Blood in stool   . Chicken pox   . Chronic back pain   . Coronary artery disease    a. NSTEMI 4/18; b. LHC 4/18: LM nl, ost LAD 100% with L-L & R-L collats, LCx nl, OM3 40%, RCA with mild irregs  . GERD (gastroesophageal reflux disease)   . Heart disease   . HLD (hyperlipidemia)   . HTN (hypertension)   . Ischemic cardiomyopathy    a. echo 4/18: EF 40-45%, ant HK, Gr1DD, mild MR, mild LAE, nl RVSF  . NSTEMI (non-ST elevated  myocardial infarction) ([place]) 06/2016  . Obesity    Past Surgical History:  Procedure Laterality Date  . CARDIAC CATHETERIZATION    . COLONOSCOPY WITH PROPOFOL N/A 05/13/2018   Procedure: COLONOSCOPY WITH PROPOFOL;  Surgeon: Lin Landsman, MD;  Location: [facility] ENDOSCOPY;  Service: Gastroenterology;  Laterality: N/A;  . GIVENS CAPSULE STUDY N/A 10/08/2018   Procedure: GIVENS CAPSULE STUDY;  Surgeon: Lin Landsman, MD;  Location: [facility] ENDOSCOPY;  Service: Gastroenterology;  Laterality: N/A;  . LEFT HEART CATH AND CORONARY ANGIOGRAPHY N/A 07/11/2016   Procedure: Left Heart Cath and Coronary Angiography;  Surgeon: Nelva Bush, MD;  Location: [place] CV LAB;  Service: Cardiovascular;  Laterality: N/A;  . TUBAL LIGATION      Current Meds  Medication Sig  . ASPIRIN LOW DOSE 81 MG EC tablet TAKE 1 TABLET BY MOUTH  DAILY  . buPROPion (WELLBUTRIN SR) 150 MG 12 hr tablet Take 1 tablet daily for first 3 days, then 1 tablet twice daily thereafter.  . carvedilol (COREG) 3.125 MG tablet TAKE 1 TABLET BY MOUTH  TWICE DAILY WITH MEALS  . ezetimibe (ZETIA) 10 MG tablet TAKE 1 TABLET BY MOUTH  DAILY  . gabapentin (NEURONTIN) 100 MG capsule Take 100 mg in the am & 200 mg  in the pm  . hydrochlorothiazide (HYDRODIURIL) 25 MG tablet Take 1 tablet (25 mg total) by mouth daily.  Marland Kitchen losartan (COZAAR) 25 MG tablet TAKE ONE-HALF TABLET BY  MOUTH DAILY  . naproxen sodium (ANAPROX) 220 MG tablet Take 220 mg by mouth as needed.  Marland Kitchen  nitroGLYCERIN (NITROSTAT) 0.4 MG SL tablet DISSOLVE 1 TABLET ON THE  TONGUE EVERY 5 MINS AS  NEEDED FOR CHEST PAIN MAX 3 TABS IN 15 MINUTES, CALL  911 IF CHEST PAIN PERSISTS  . rosuvastatin (CRESTOR) 40 MG tablet TAKE 1 TABLET BY MOUTH  DAILY    Allergies: Patient has no known allergies.  Social History   Tobacco Use  . Smoking status: Current Every Day Smoker    Packs/day: 0.50    Years: 38.00    Pack years: 19.00    Types: Cigarettes  . Smokeless tobacco: Never  Used  Vaping Use  . Vaping Use: Never used  Substance Use Topics  . Alcohol use: Yes    Comment: ocassionally  . Drug use: Never    Family History  Problem Relation Age of Onset  . Heart attack Father   . COPD Mother   . Anuerysm Brother        brain    Review of Systems: A 12-system review of systems was performed and was negative except as noted in the HPI.  --------------------------------------------------------------------------------------------------  Physical Exam: BP 140/80 (BP Location: Left Arm, Patient Position: Sitting, Cuff Size: Large)   Pulse 83   Ht 5' 9"  (1.753 m)   Wt 247 lb 6 oz (112.2 kg)   SpO2 94%   BMI 36.53 kg/m   General:  NAD Neck: No JVD or HJR. Lungs: Mildly diminished breath sounds without wheezes or crackles. Heart: RRR w/o murmurs, rubs, or gallops. Abdomen: Soft, NT/ND Extremities: Trace pretibial edema bilaterally.  EKG: Normal sinus rhythm with right axis deviation and anteroseptal Q waves.  No significant change from prior tracing on 02/05/2019.  Lab Results  Component Value Date   WBC 7.9 03/07/2019   HGB 14.5 03/07/2019   HCT 43.6 03/07/2019   MCV 86 03/07/2019   PLT 191 03/07/2019    Lab Results  Component Value Date   NA 140 03/07/2019   K 3.7 03/07/2019   CL 96 03/07/2019   CO2 29 03/07/2019   BUN 19 03/07/2019   CREATININE 0.98 03/07/2019   GLUCOSE 86 03/07/2019   ALT 20 03/07/2019    Lab Results  Component Value Date   CHOL 108 03/07/2019   HDL 43 03/07/2019   LDLCALC 50 03/07/2019   LDLDIRECT 76 08/30/2016   TRIG 69 03/07/2019   CHOLHDL 2.5 03/07/2019    --------------------------------------------------------------------------------------------------  ASSESSMENT AND PLAN: Coronary artery disease: Ms. Vargus denies recurrent angina and has stable exertional dyspnea that is likely multifactorial.  Continue current medications for secondary prevention.  Chronic HFrEF: Ms. Menzel has stable NYHA  class II symptoms with trace pedal edema on exam today.  Her blood pressure is also borderline elevated today at 140/80.  We discussed the importance of sodium restriction and have agreed to continue her current regimen of carvedilol and losartan.  If her dyspnea or edema worsen, she should let us know.  Hypertension: Blood pressure mildly elevated today.  Ms. Kareem has not been checking her blood pressure regularly at home, which I have encouraged her to do.  Defer medication changes today, though if blood pressure remains elevated, I would favor escalation of losartan.  Hyperlipidemia: LDL at goal.  Continue rosuvastatin and ezetimibe.  Tobacco abuse: Ms. Cleary could not afford Chantix and is currently on bupropion.  She feels like she has cut back on smoking but has not stopped completely.  I think it is reasonable to continue with a few more months of bupropion.  I have encouraged Ms. Gunby to work on quitting smoking.  Morbid obesity: BMI remains greater than 35 with multiple comorbidities.  I have encouraged Ms. Seki to work on [employer] loss through diet and increased activity.  Follow-up: Return to clinic in 1 year. Nelva Bush, MD 09/17/2019 10:07 AM

## 2019-09-17 NOTE — Patient Instructions (Signed)
Medication Instructions:  Your physician recommends that you continue on your current medications as directed. Please refer to the Current Medication list given to you today.  *If you need a refill on your cardiac medications before your next appointment, please call your pharmacy*   Follow-Up: At [facility], you and your health needs are our priority.  As part of our continuing mission to provide you with exceptional heart care, we have created designated Provider Care Teams.  These Care Teams include your primary Cardiologist (physician) and Advanced Practice Providers (APPs -  Physician Assistants and Nurse Practitioners) who all work together to provide you with the care you need, when you need it.  We recommend signing up for the patient portal called "MyChart".  Sign up information is provided on this After Visit Summary.  MyChart is used to connect with patients for Virtual Visits (Telemedicine).  Patients are able to view lab/test results, encounter notes, upcoming appointments, etc.  Non-urgent messages can be sent to your provider as well.   To learn more about what you can do with MyChart, go to [email or web address].    Your next appointment:   12 month(s)  The format for your next appointment:   In Person  Provider:    You may see Nelva Bush, MD or one of the following Advanced Practice Providers on your designated Care Team:    Murray Hodgkins, NP  Christell Faith, PA-C  Marrianne Mood, PA-C   Steps to Quit Smoking Smoking tobacco is the leading cause of preventable death. It can affect almost every organ in the body. Smoking puts you and people around you at risk for many serious, long-lasting (chronic) diseases. Quitting smoking can be hard, but it is one of the best things that you can do for your health. It is never too late to quit. How do I get ready to quit? When you decide to quit smoking, make a plan to help you succeed. Before you quit:  Pick a date  to quit. Set a date within the next 2 weeks to give you time to prepare.  Write down the reasons why you are quitting. Keep this list in places where you will see it often.  Tell your family, friends, and co-workers that you are quitting. Their support is important.  Talk with your doctor about the choices that may help you quit.  Find out if your health insurance will pay for these treatments.  Know the people, places, things, and activities that make you want to smoke (triggers). Avoid them. What first steps can I take to quit smoking?  Throw away all cigarettes at home, at work, and in your car.  Throw away the things that you use when you smoke, such as ashtrays and lighters.  Clean your car. Make sure to empty the ashtray.  Clean your home, including curtains and carpets. What can I do to help me quit smoking? Talk with your doctor about taking medicines and seeing a counselor at the same time. You are more likely to succeed when you do both.  If you are pregnant or breastfeeding, talk with your doctor about counseling or other ways to quit smoking. Do not take medicine to help you quit smoking unless your doctor tells you to do so. To quit smoking: Quit right away  Quit smoking totally, instead of slowly cutting back on how much you smoke over a period of time.  Go to counseling. You are more likely to quit if you go to  counseling sessions regularly. Take medicine You may take medicines to help you quit. Some medicines need a prescription, and some you can buy over-the-counter. Some medicines may contain a drug called nicotine to replace the nicotine in cigarettes. Medicines may:  Help you to stop having the desire to smoke (cravings).  Help to stop the problems that come when you stop smoking (withdrawal symptoms). Your doctor may ask you to use:  Nicotine patches, gum, or lozenges.  Nicotine inhalers or sprays.  Non-nicotine medicine that is taken by mouth. Find  resources Find resources and other ways to help you quit smoking and remain smoke-free after you quit. These resources are most helpful when you use them often. They include:  Online chats with a Social worker.  Phone quitlines.  Printed Furniture conservator/restorer.  Support groups or group counseling.  Text messaging programs.  Mobile phone apps. Use apps on your mobile phone or tablet that can help you stick to your quit plan. There are many free apps for mobile phones and tablets as well as websites. Examples include Quit Guide from the [employer] and smokefree.gov  What things can I do to make it easier to quit?   Talk to your family and friends. Ask them to support and encourage you.  Call a phone quitline (1-800-QUIT-NOW), reach out to support groups, or work with a Social worker.  Ask people who smoke to not smoke around you.  Avoid places that make you want to smoke, such as: ? Bars. ? Parties. ? Smoke-break areas at work.  Spend time with people who do not smoke.  Lower the stress in your life. Stress can make you want to smoke. Try these things to help your stress: ? Getting regular exercise. ? Doing deep-breathing exercises. ? Doing yoga. ? Meditating. ? Doing a body scan. To do this, close your eyes, focus on one area of your body at a time from head to toe. Notice which parts of your body are tense. Try to relax the muscles in those areas. How will I feel when I quit smoking? Day 1 to 3 weeks Within the first 24 hours, you may start to have some problems that come from quitting tobacco. These problems are very bad 2-3 days after you quit, but they do not often last for more than 2-3 weeks. You may get these symptoms:  Mood swings.  Feeling restless, nervous, angry, or annoyed.  Trouble concentrating.  Dizziness.  Strong desire for high-sugar foods and nicotine.  Weight gain.  Trouble pooping (constipation).  Feeling like you may vomit (nausea).  Coughing or a sore  throat.  Changes in how the medicines that you take for other issues work in your body.  Depression.  Trouble sleeping (insomnia). Week 3 and afterward After the first 2-3 weeks of quitting, you may start to notice more positive results, such as:  Better sense of smell and taste.  Less coughing and sore throat.  Slower heart rate.  Lower blood pressure.  Clearer skin.  Better breathing.  Fewer sick days. Quitting smoking can be hard. Do not give up if you fail the first time. Some people need to try a few times before they succeed. Do your best to stick to your quit plan, and talk with your doctor if you have any questions or concerns. Summary  Smoking tobacco is the leading cause of preventable death. Quitting smoking can be hard, but it is one of the best things that you can do for your health.  When  you decide to quit smoking, make a plan to help you succeed.  Quit smoking right away, not slowly over a period of time.  When you start quitting, seek help from your doctor, family, or friends. This information is not intended to replace advice given to you by your health care provider. Make sure you discuss any questions you have with your health care provider. Document Revised: 11/29/2018 Document Reviewed: 05/25/2018 Elsevier Patient Education  [place].

## 2019-12-07 ENCOUNTER — Other Ambulatory Visit: Payer: Self-pay | Admitting: Internal Medicine

## 2020-01-26 ENCOUNTER — Other Ambulatory Visit: Payer: Self-pay | Admitting: Internal Medicine

## 2020-04-22 ENCOUNTER — Other Ambulatory Visit: Payer: Self-pay | Admitting: Physician Assistant

## 2020-04-22 ENCOUNTER — Other Ambulatory Visit: Payer: Self-pay | Admitting: Internal Medicine

## 2020-04-23 NOTE — Telephone Encounter (Signed)
Rx request sent to pharmacy.  

## 2020-05-21 IMAGING — MG DIGITAL SCREENING BILATERAL MAMMOGRAM WITH TOMO AND CAD
8 series · 8 of 24 positions shown · non-contrast
Comparison: Previous exam(s).

CLINICAL DATA: Screening.

EXAM:
DIGITAL SCREENING BILATERAL MAMMOGRAM WITH TOMO AND CAD

[R CC synth-2D]
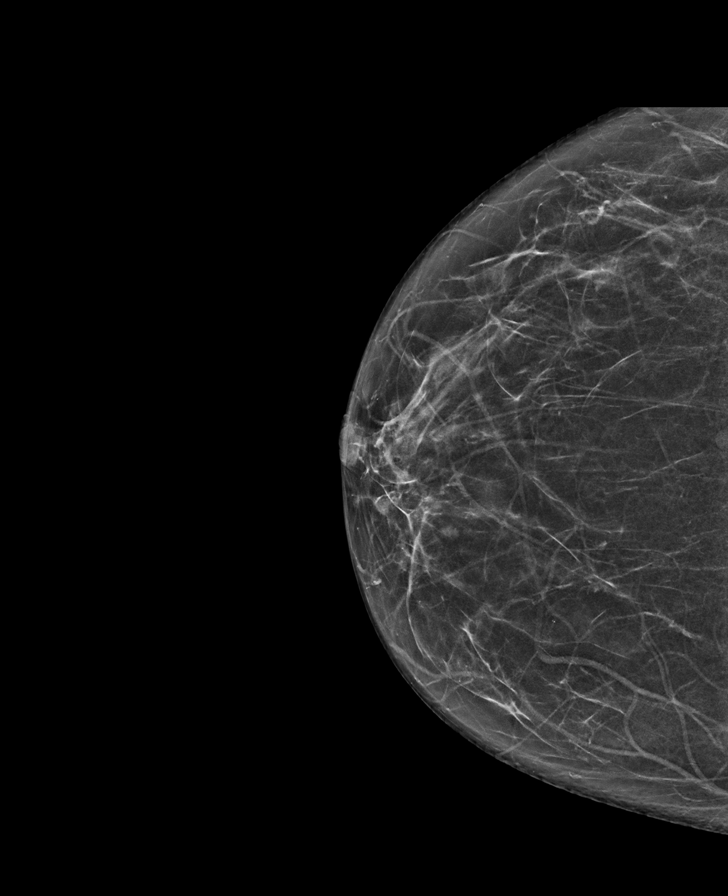

[L CC synth-2D]
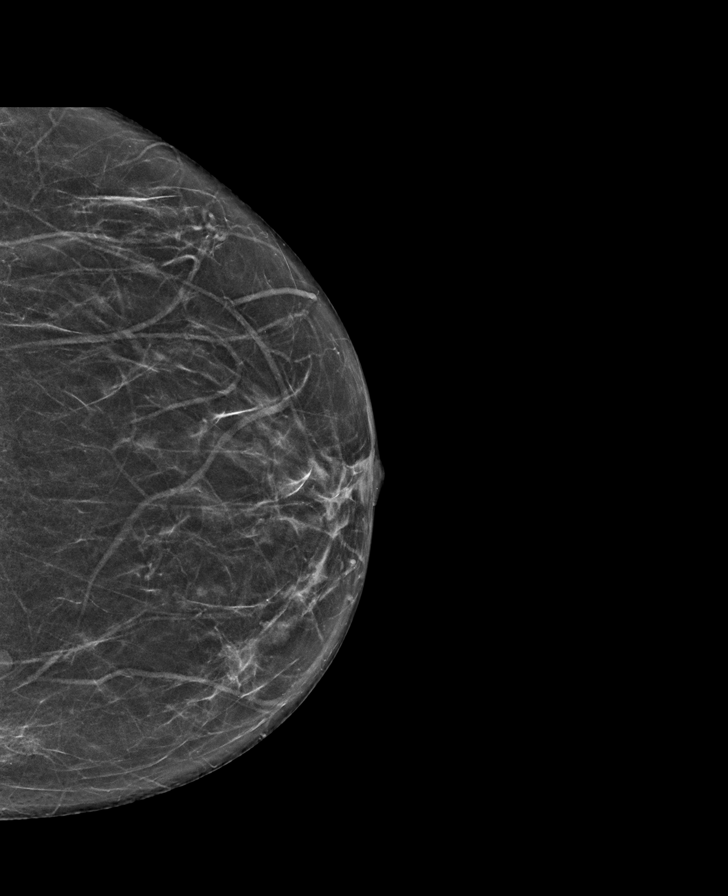

[L MLO synth-2D]
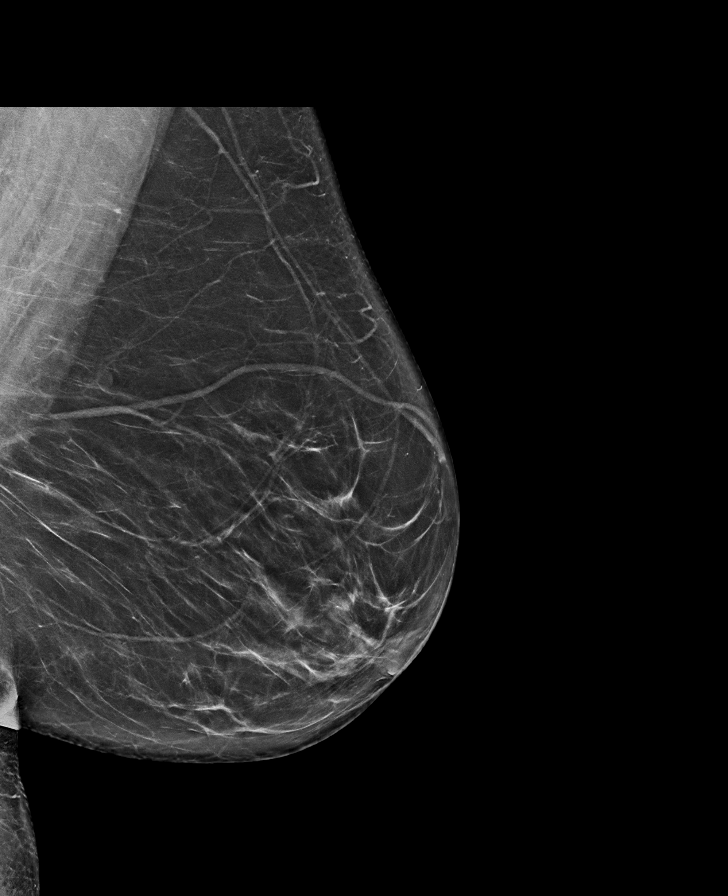

[R MLO synth-2D]
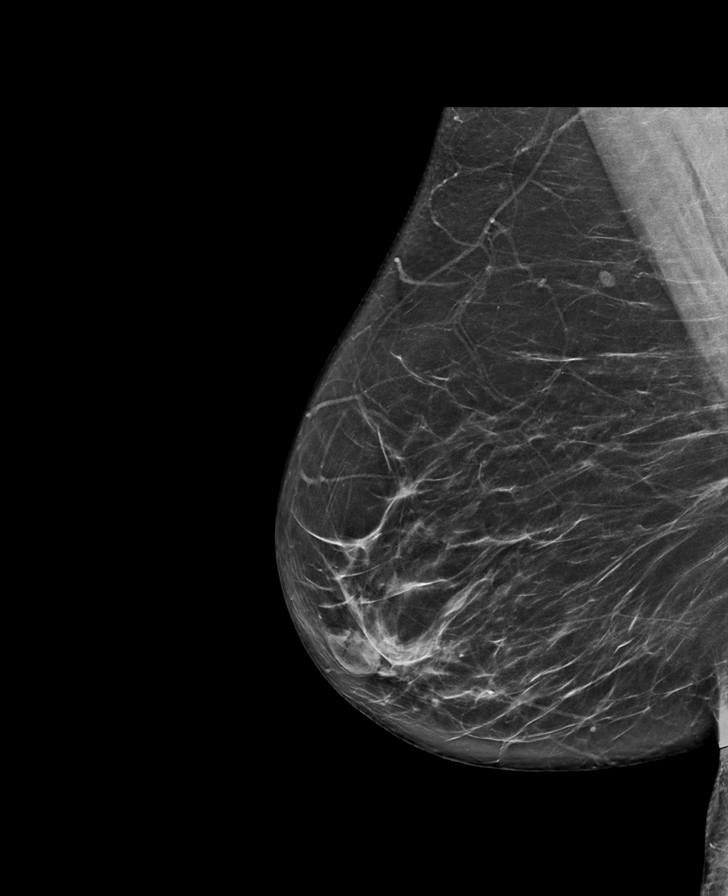

[R CC tomo · tomo slice 33/64.0]
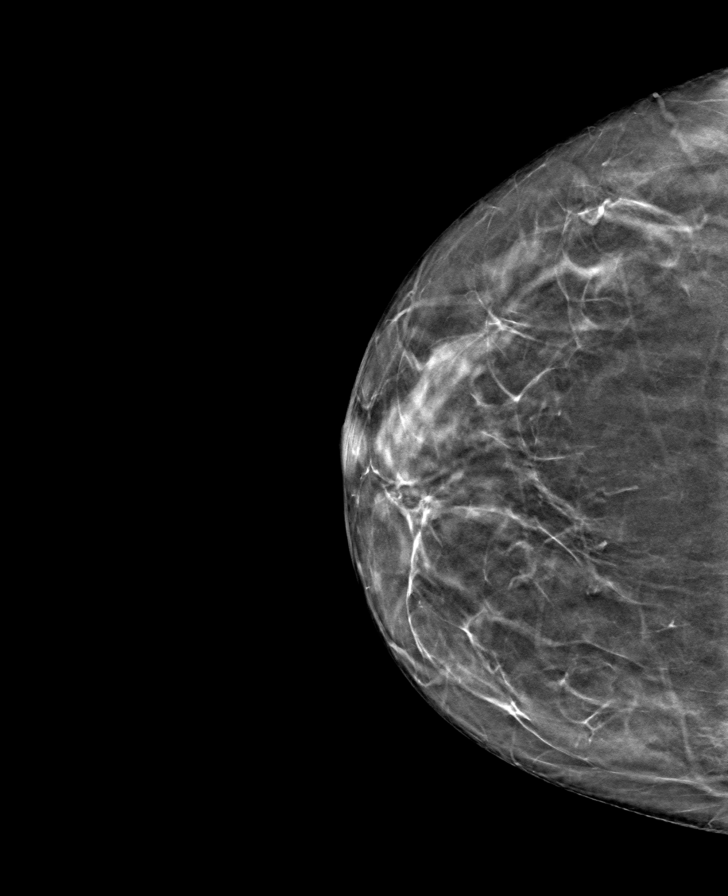

[R MLO tomo · tomo slice 39/77.0]
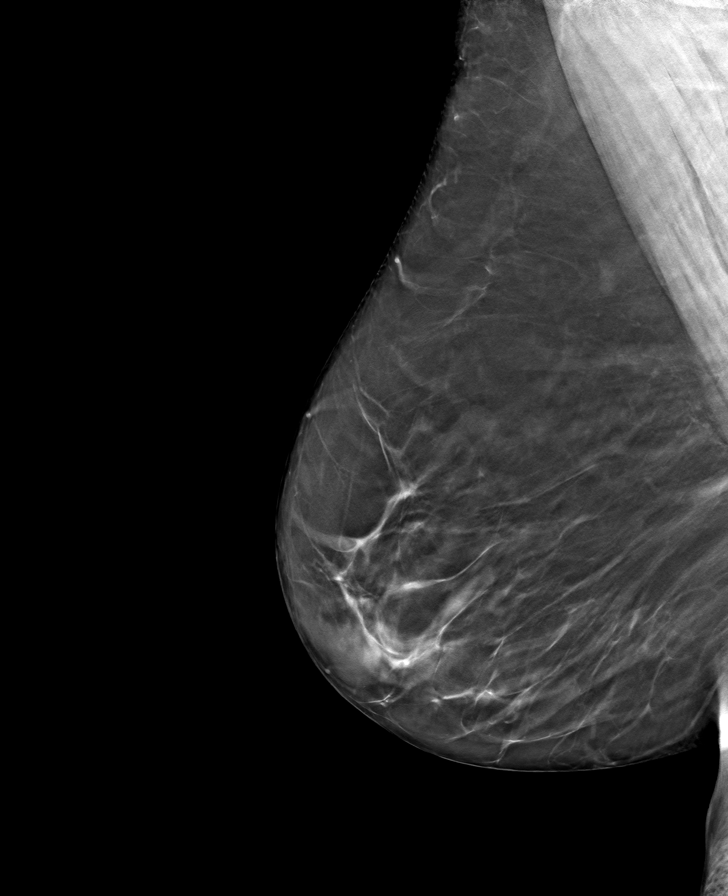

[L MLO tomo · tomo slice 41/80.0]
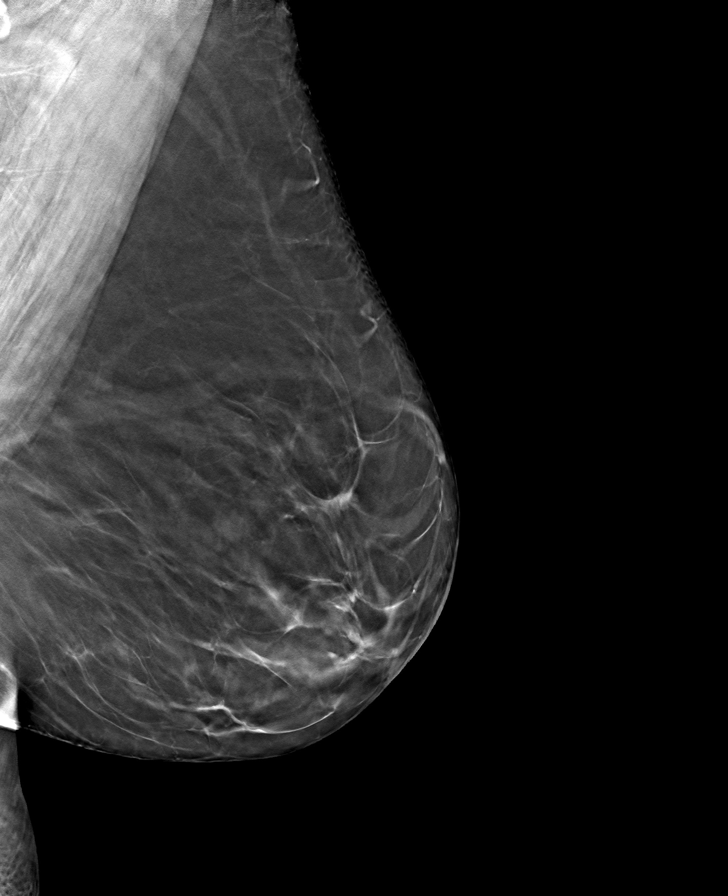

[L CC tomo · tomo slice 29/58.0]
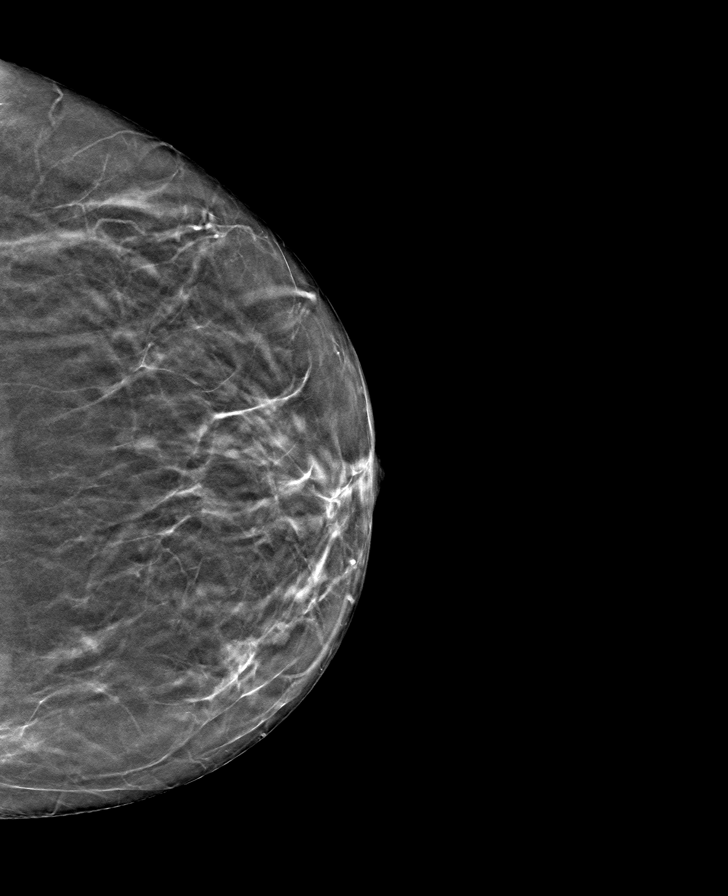

[8 of 24 positions shown; findings below may reference images not displayed]

ACR Breast Density Category b: There are scattered areas of
fibroglandular density.
FINDINGS: There are no findings suspicious for malignancy. Images were
processed with CAD.
IMPRESSION: No mammographic evidence of malignancy. A result letter of this
screening mammogram will be mailed directly to the patient.

RECOMMENDATION:
Screening mammogram in one year. (Code:[id number])

BI-RADS CATEGORY  1: Negative.

## 2020-08-18 ENCOUNTER — Other Ambulatory Visit: Payer: Self-pay | Admitting: Internal Medicine

## 2020-09-24 NOTE — Progress Notes (Deleted)
Cardiology Office Note    Date:  09/24/2020   ID:  Deanna Moran, DOB 1965-10-22, MRN [id number]  PCP:  Jodelle Green, [name]  Cardiologist:  Nelva Bush, MD  Electrophysiologist:  None   Chief Complaint: Follow up  History of Present Illness:   Deanna Moran is a 55 y.o. female with history of CAD with NSTEMI in 06/2016 that was medically managed, HFrEF secondary to ICM, HTN, HLD, Crohn's disease, peripheral neuropathy, and tobacco use who presents for follow-up of her CAD and cardiomyopathy.  She was admitted in 06/2016 with an NSTEMI with troponin peaking at 10.78.  LHC at that time showed significant one-vessel CAD with subacute versus CTO of the ostial LAD with left to left and right to left collaterals.  There was moderate nonobstructive disease involving the LCx and RCA.  LVEF was 30 to 35% with anterior and apical wall motion abnormality.  Echo at that time showed an EF of 40 to 45%, anterior wall hypokinesis, grade 1 diastolic dysfunction, mild mitral regurgitation, mild left atrial enlargement, and normal RV systolic function and ventricular cavity size.  Prior ABIs were normal in 2019.  She was last seen in the office in 08/2019 with stable exertional dyspnea that had previously been attributed to inactivity and weight gain during the [place] pandemic.  Outside of work, she had not been very active.  It was noted that she was working 14 hours/day on most days.  She was tolerating bupropion and it noted this had helped with some of her depression.  She did continue to smoke about 1/2 pack/day.  Financial stressors were contributing to her difficulty in quitting tobacco.  She had previously been unable to afford Chantix.  No medication changes were made at that time.  ***   Labs independently reviewed: 02/2019 - TC 108, TG 69, HDL 43, LDL 50, Hgb 14.5, PLT 191, BUN 19, serum creatinine 0.98, potassium 3.7, albumin 4.4, AST/ALT normal 06/2016 - TSH normal, A1c 5.7  Past Medical  History:  Diagnosis Date   Blood in stool    Chicken pox    Chronic back pain    Coronary artery disease    a. NSTEMI 4/18; b. LHC 4/18: LM nl, ost LAD 100% with L-L & R-L collats, LCx nl, OM3 40%, RCA with mild irregs   GERD (gastroesophageal reflux disease)    Heart disease    HLD (hyperlipidemia)    HTN (hypertension)    Ischemic cardiomyopathy    a. echo 4/18: EF 40-45%, ant HK, Gr1DD, mild MR, mild LAE, nl RVSF   NSTEMI (non-ST elevated myocardial infarction) ([place]) 06/2016   Obesity     Past Surgical History:  Procedure Laterality Date   CARDIAC CATHETERIZATION     COLONOSCOPY WITH PROPOFOL N/A 05/13/2018   Procedure: COLONOSCOPY WITH PROPOFOL;  Surgeon: Lin Landsman, MD;  Location: [facility] ENDOSCOPY;  Service: Gastroenterology;  Laterality: N/A;   GIVENS CAPSULE STUDY N/A 10/08/2018   Procedure: GIVENS CAPSULE STUDY;  Surgeon: Lin Landsman, MD;  Location[facility] ENDOSCOPY;  Service: Gastroenterology;  Laterality: N/A;   LEFT HEART CATH AND CORONARY ANGIOGRAPHY N/A 07/11/2016   Procedure: Left Heart Cath and Coronary Angiography;  Surgeon: Nelva Bush, MD;  Location: [place] [facility];  Service: Cardiovascular;  Laterality: N/A;   TUBAL LIGATION      Current Medications: No outpatient medications have been marked as taking for the 09/29/20 encounter (Appointment) with Rise Mu, PA-C.    Allergies:   Patient has  no known allergies.   Social History   Socioeconomic History   Marital status: Divorced    Spouse name: Not on file   Number of children: Not on file   Years of education: Not on file   Highest education level: Not on file  Occupational History   Not on file  Tobacco Use   Smoking status: Every Day    Packs/day: 0.50    Years: 38.00    Pack years: 19.00    Types: Cigarettes   Smokeless tobacco: Never  Vaping Use   Vaping Use: Never used  Substance and Sexual Activity   Alcohol use: Yes    Comment: ocassionally   Drug use: Never    Sexual activity: Not Currently  Other Topics Concern   Not on file  Social History Narrative   Not on file   Social Determinants of Health   Financial Resource Strain: Not on file  Food Insecurity: Not on file  Transportation Needs: Not on file  Physical Activity: Not on file  Stress: Not on file  Social Connections: Not on file     Family History:  The patient's family history includes Anuerysm in her brother; COPD in her mother; Heart attack in her father.  ROS:   ROS   EKGs/Labs/Other Studies Reviewed:    Studies reviewed were summarized above. The additional studies were reviewed today:  ABIs 11/2017: Final Interpretation:  Right: Resting right ankle-brachial index is within normal range. No  evidence of significant right lower extremity arterial disease. The right  toe-brachial index is normal.   Left: Resting left ankle-brachial index is within normal range. No  evidence of significant left lower extremity arterial disease. The left  toe-brachial index is normal.  __________  2D echo 06/2016: - Procedure narrative: Transthoracic echocardiography. The study    was technically difficult.  - Left ventricle: The cavity size was normal. Systolic function was    mildly to moderately reduced. The estimated ejection fraction was    in the range of 40% to 45%. Hypokinesis of the anterior    myocardium. Hypokinesis of the apical myocardium. Hypokinesis of    the anteroseptal myocardium. Doppler parameters are consistent    with abnormal left ventricular relaxation (grade 1 diastolic    dysfunction).  - Mitral valve: There was mild regurgitation.  - Left atrium: The atrium was mildly dilated.  - Right ventricle: Systolic function was normal.  - Pulmonary arteries: Systolic pressure was within the normal    range. __________  [facility] 06/2016: Conclusions: Significant single-vessel coronary artery disease with subacute vs. chronic total occlusion of ostial LAD. Distal  vessel fills by left-to-left and right-to-left collaterals. Mild to moderate, non-obstructive disease involving the LCx and RCA. Upper normal left ventricular filling pressure. Moderate to severely reduced LV contraction with mid/apical anterior, apical, and apical inferior akinesis. LVEF 30-35%.   Recommendations: Medical therapy including aspirin and clopidogrel for 12 months. Low-dose beta-blocker, to be uptitrated as heart rate and blood pressure allow. Aggressive secondary prevention, including high-intensity statin therapy and smoking cessation. Add ACEI/ARB as blood pressure allows prior to discharge. Follow-up echocardiogram with particular attention to evaluate for LV apical thrombus.    EKG:  EKG is ordered today.  The EKG ordered today demonstrates ***  Recent Labs: No results found for requested labs within last 8760 hours.  Recent Lipid Panel    Component Value Date/Time   CHOL 108 03/07/2019 0837   TRIG 69 03/07/2019 0837   HDL 43 03/07/2019 0837  CHOLHDL 2.5 03/07/2019 0837   CHOLHDL 4.6 07/11/2016 0459   VLDL 10 07/11/2016 0459   LDLCALC 50 03/07/2019 0837   LDLDIRECT 76 08/30/2016 1108    PHYSICAL EXAM:    VS:  There were no vitals taken for this visit.  BMI: There is no height or weight on file to calculate BMI.  Physical Exam  Wt Readings from Last 3 Encounters:  09/17/19 247 lb 6 oz (112.2 kg)  05/08/19 249 lb (112.9 kg)  02/05/19 247 lb (112 kg)     ASSESSMENT & PLAN:   CAD involving the native coronary arteries with***angina/dyspnea:  HFrEF secondary to ICM:  HTN: Blood pressure ***  HLD: LDL of 50 in 02/2019.  Tobacco use:  Obesity:  Disposition: F/u with Dr. Saunders Revel or an APP in ***.   Medication Adjustments/Labs and Tests Ordered: Current medicines are reviewed at length with the patient today.  Concerns regarding medicines are outlined above. Medication changes, Labs and Tests ordered today are summarized above and listed in the  Patient Instructions accessible in Encounters.   Signed, Christell Faith, PA-C 09/24/2020 11:48 AM     [facility] [address] [place] [address] [place], [place] [address] [phone]

## 2020-09-29 ENCOUNTER — Ambulatory Visit: Payer: Managed Care, Other (non HMO) | Admitting: Physician Assistant

## 2020-10-06 ENCOUNTER — Other Ambulatory Visit: Payer: Self-pay | Admitting: Physician Assistant

## 2020-11-10 NOTE — Progress Notes (Signed)
Cardiology Office Note    Date:  11/12/2020   ID:  Moran, Deanna [birth year], MRN [id number]  PCP:  Jodelle Green, [name]  Cardiologist:  Nelva Bush, MD  Electrophysiologist:  None   Chief Complaint: Follow-up  History of Present Illness:   Deanna Moran is a 55 y.o. female with history of CAD with NSTEMI in 06/2016 that was medically managed, HFrEF secondary to ICM, HTN, HLD, Crohn's disease, peripheral neuropathy, and tobacco use who presents for follow-up of her CAD and cardiomyopathy.  She was admitted to the hospital in 06/2016 with an NSTEMI with troponin peaking at 10.78.  LHC at that time demonstrated significant one-vessel CAD with subacute versus CTO of the ostial LAD with left to left and right to left collaterals.  There was moderate nonobstructive stenosis involving the LCx and RCA.  LVEF was 30 to 35% with anterior and apical wall motion abnormality noted.  She was medically managed.  Echo during that admission showed an EF of 40 to 45%, anterior hypokinesis, grade 1 diastolic dysfunction, mild mitral regurgitation, mild left atrial enlargement, and normal RV systolic function and ventricular cavity size.  She has not required ischemic evaluation since.  She was last seen in the office in 08/2019, noting stable exertional dyspnea.  She had not been very active outside of working long shifts at work, frequently 14 hours/day.  She was tolerating bupropion in an effort to quit smoking.  She did continue to smoke about half pack per day.  Stressors were making it more difficult for her to quit.  She comes in today and is doing well from a cardiac perspective.  Approximately 6 months ago she self discontinued all medications.  She reports that she was tired of taking them.  She does plan on resuming her medications after today's visit.  She has been without symptoms concerning for angina, worsening dyspnea, palpitations, dizziness, presyncope, or syncope.  Despite being off all  medications her blood pressure has remained well controlled with readings in the 120s to 737T mmHg systolic.  She does continue to smoke, though more recently has been smoking less secondary to recent move.  1 pack of cigarettes currently has lasted her greater than 2 days.  She remains very busy at work frequently working 10 to 12-hour, sometimes 14-hour days.  At times, she will work 6 days/week.  With this, there is difficulty in her finding time for regular exercise.   Labs independently reviewed: 10/2020 - A1c 5.6, serum creatinine 0.97, TC 224, TG 88, HDL 41, LDL 167 (obtained from patient's phone at recent health fair through [place]) 02/2019 - TC 108, TG 69, HDL 43, LDL 50, Hgb 14.5, PLT 191, BUN 19, serum creatinine 0.98, potassium 3.7, albumin 4.4, AST/ALT normal 06/2016 - TSH normal  Past Medical History:  Diagnosis Date   Blood in stool    Chicken pox    Chronic back pain    Coronary artery disease    a. NSTEMI 4/18; b. LHC 4/18: LM nl, ost LAD 100% with L-L & R-L collats, LCx nl, OM3 40%, RCA with mild irregs   GERD (gastroesophageal reflux disease)    Heart disease    HLD (hyperlipidemia)    HTN (hypertension)    Ischemic cardiomyopathy    a. echo 4/18: EF 40-45%, ant HK, Gr1DD, mild MR, mild LAE, nl RVSF   NSTEMI (non-ST elevated myocardial infarction) ([facility]) 06/2016   Obesity     Past Surgical History:  Procedure Laterality Date  CARDIAC CATHETERIZATION     COLONOSCOPY WITH PROPOFOL N/A 05/13/2018   Procedure: COLONOSCOPY WITH PROPOFOL;  Surgeon: Lin Landsman, MD;  Location: [facility] ENDOSCOPY;  Service: Gastroenterology;  Laterality: N/A;   GIVENS CAPSULE STUDY N/A 10/08/2018   Procedure: GIVENS CAPSULE STUDY;  Surgeon: Lin Landsman, MD;  Location: [facility] ENDOSCOPY;  Service: Gastroenterology;  Laterality: N/A;   LEFT HEART CATH AND CORONARY ANGIOGRAPHY N/A 07/11/2016   Procedure: Left Heart Cath and Coronary Angiography;  Surgeon: Nelva Bush, MD;  Location:  [place] [facility] CV LAB;  Service: Cardiovascular;  Laterality: N/A;   TUBAL LIGATION      Current Medications: Current Meds  Medication Sig   ASPIRIN LOW DOSE 81 MG EC tablet TAKE 1 TABLET BY MOUTH  DAILY   buPROPion (WELLBUTRIN SR) 150 MG 12 hr tablet Take 1 tablet (150 mg total) by mouth 2 (two) times daily.   carvedilol (COREG) 3.125 MG tablet TAKE 1 TABLET BY MOUTH  TWICE DAILY WITH MEALS (Patient taking differently: TAKE 1 TABLET BY MOUTH  TWICE DAILY WITH MEALS)   ezetimibe (ZETIA) 10 MG tablet TAKE 1 TABLET BY MOUTH  DAILY   nitroGLYCERIN (NITROSTAT) 0.4 MG SL tablet DISSOLVE 1 TABLET ON THE  TONGUE EVERY 5 MINS AS  NEEDED FOR CHEST PAIN MAX 3 TABS IN 15 MINUTES, CALL  911 IF CHEST PAIN PERSISTS (Patient taking differently: DISSOLVE 1 TABLET ON THE  TONGUE EVERY 5 MINS AS  NEEDED FOR CHEST PAIN MAX 3 TABS IN 15 MINUTES, CALL  911 IF CHEST PAIN PERSISTS)   rosuvastatin (CRESTOR) 40 MG tablet TAKE 1 TABLET BY MOUTH  DAILY    Allergies:   Patient has no known allergies.   Social History   Socioeconomic History   Marital status: Divorced    Spouse name: Not on file   Number of children: Not on file   Years of education: Not on file   Highest education level: Not on file  Occupational History   Not on file  Tobacco Use   Smoking status: Every Day    Packs/day: 0.50    Years: 38.00    Pack years: 19.00    Types: Cigarettes   Smokeless tobacco: Never  Vaping Use   Vaping Use: Never used  Substance and Sexual Activity   Alcohol use: Yes    Comment: ocassionally   Drug use: Never   Sexual activity: Not Currently  Other Topics Concern   Not on file  Social History Narrative   Not on file   Social Determinants of Health   Financial Resource Strain: Not on file  Food Insecurity: Not on file  Transportation Needs: Not on file  Physical Activity: Not on file  Stress: Not on file  Social Connections: Not on file     Family History:  The patient's family history includes  Anuerysm in her brother; COPD in her mother; Heart attack in her father.  ROS:   Review of Systems  Constitutional:  Positive for malaise/fatigue. Negative for chills, diaphoresis, fever and weight loss.  HENT:  Negative for congestion.   Eyes:  Negative for discharge and redness.  Respiratory:  Negative for cough, sputum production, shortness of breath and wheezing.   Cardiovascular:  Negative for chest pain, palpitations, orthopnea, claudication, leg swelling and PND.  Gastrointestinal:  Negative for abdominal pain, heartburn, nausea and vomiting.  Musculoskeletal:  Negative for falls and myalgias.  Skin:  Negative for rash.  Neurological:  Negative for dizziness, tingling, tremors, sensory change,  speech change, focal weakness, loss of consciousness and weakness.  Endo/Heme/Allergies:  Does not bruise/bleed easily.  Psychiatric/Behavioral:  Negative for substance abuse. The patient is not nervous/anxious.   All other systems reviewed and are negative.   EKGs/Labs/Other Studies Reviewed:    Studies reviewed were summarized above. The additional studies were reviewed today:  LHC 06/2016: Conclusions: Significant single-vessel coronary artery disease with subacute vs. chronic total occlusion of ostial LAD. Distal vessel fills by left-to-left and right-to-left collaterals. Mild to moderate, non-obstructive disease involving the LCx and RCA. Upper normal left ventricular filling pressure. Moderate to severely reduced LV contraction with mid/apical anterior, apical, and apical inferior akinesis. LVEF 30-35%.   Recommendations: Medical therapy including aspirin and clopidogrel for 12 months. Low-dose beta-blocker, to be uptitrated as heart rate and blood pressure allow. Aggressive secondary prevention, including high-intensity statin therapy and smoking cessation. Add ACEI/ARB as blood pressure allows prior to discharge. Follow-up echocardiogram with particular attention to evaluate for  LV apical thrombus. __________  Echo 06/2016: - Procedure narrative: Transthoracic echocardiography. The study    was technically difficult.  - Left ventricle: The cavity size was normal. Systolic function was    mildly to moderately reduced. The estimated ejection fraction was    in the range of 40% to 45%. Hypokinesis of the anterior    myocardium. Hypokinesis of the apical myocardium. Hypokinesis of    the anteroseptal myocardium. Doppler parameters are consistent    with abnormal left ventricular relaxation (grade 1 diastolic    dysfunction).  - Mitral valve: There was mild regurgitation.  - Left atrium: The atrium was mildly dilated.  - Right ventricle: Systolic function was normal.  - Pulmonary arteries: Systolic pressure was within the normal    range.     EKG:  EKG is ordered today.  The EKG ordered today demonstrates NSR, 76 bpm, incomplete RBBB, no acute ST-T changes  Recent Labs: No results found for requested labs within last 8760 hours.  Recent Lipid Panel    Component Value Date/Time   CHOL 108 03/07/2019 0837   TRIG 69 03/07/2019 0837   HDL 43 03/07/2019 0837   CHOLHDL 2.5 03/07/2019 0837   CHOLHDL 4.6 07/11/2016 0459   VLDL 10 07/11/2016 0459   LDLCALC 50 03/07/2019 0837   LDLDIRECT 76 08/30/2016 1108    PHYSICAL EXAM:    VS:  BP 130/68 (BP Location: Left Arm, Patient Position: Sitting, Cuff Size: Large)   Pulse 76   Ht 5' 9"  (1.753 m)   Wt 236 lb (107 kg)   SpO2 96%   BMI 34.85 kg/m   BMI: Body mass index is 34.85 kg/m.  Physical Exam Vitals reviewed.  Constitutional:      Appearance: She is well-developed.  HENT:     Head: Normocephalic and atraumatic.  Eyes:     General:        Right eye: No discharge.        Left eye: No discharge.  Neck:     Vascular: No JVD.  Cardiovascular:     Rate and Rhythm: Normal rate and regular rhythm.     Pulses:          Posterior tibial pulses are 2+ on the right side and 2+ on the left side.     Heart  sounds: Normal heart sounds, S1 normal and S2 normal. Heart sounds not distant. No midsystolic click and no opening snap. No murmur heard.   No friction rub.  Pulmonary:  Effort: Pulmonary effort is normal. No respiratory distress.     Breath sounds: Normal breath sounds. No decreased breath sounds, wheezing or rales.  Chest:     Chest wall: No tenderness.  Abdominal:     General: There is no distension.     Palpations: Abdomen is soft.     Tenderness: There is no abdominal tenderness.  Musculoskeletal:     Cervical back: Normal range of motion.     Right lower leg: No edema.     Left lower leg: No edema.  Skin:    General: Skin is warm and dry.     Nails: There is no clubbing.  Neurological:     Mental Status: She is alert and oriented to person, place, and time.  Psychiatric:        Speech: Speech normal.        Behavior: Behavior normal.        Thought Content: Thought content normal.        Judgment: Judgment normal.    Wt Readings from Last 3 Encounters:  11/12/20 236 lb (107 kg)  09/17/19 247 lb 6 oz (112.2 kg)  05/08/19 249 lb (112.9 kg)     ASSESSMENT & PLAN:   CAD involving the native coronaries without angina: She is doing well without symptoms concerning for angina.  We will resume aspirin 81 mg daily along with carvedilol, rosuvastatin, and ezetimibe for secondary prevention.  No indication for ischemic testing at this time.  HFrEF secondary to ICM: She appears euvolemic and well compensated with NYHA class II symptoms.  As previously noted, she has been off all medications for 6 months.  To reinitiate evidence-based medical therapy we resumed carvedilol 3.25 mg twice daily today.  When she is seen in follow-up look to add back losartan as her vitals and labs allow.  Not currently requiring a standing diuretic.  CHF education.  HTN: Blood pressure is well controlled in the office today despite being off all antihypertensive medication.  We did resume carvedilol  3.25 mg twice daily as outlined above given her cardiomyopathy.  When she is seen in follow-up look to add losartan as outlined above.  Low-sodium diet recommended.  HLD: LDL 50 in 02/2019 while on statin therapy.  Following self discontinuation of rosuvastatin and Zetia most recent LDL of 167 on 11/11/2020.  Goal LDL less than 70.  We did resume rosuvastatin 40 mg and ezetimibe 10 mg today.  We will plan to recheck a fasting lipid panel and LFT in approximately 2 months time with recommendation to achieve target LDL less than 70.  Tobacco use: Refill bupropion.  Complete cessation is recommended.  Disposition: F/u with Dr. Saunders Revel or an APP in 2 months.   Medication Adjustments/Labs and Tests Ordered: Current medicines are reviewed at length with the patient today.  Concerns regarding medicines are outlined above. Medication changes, Labs and Tests ordered today are summarized above and listed in the Patient Instructions accessible in Encounters.   Signed, Christell Faith, PA-C 11/12/2020 4:38 PM     [address] [place], [place] [address] [phone]

## 2020-11-12 ENCOUNTER — Ambulatory Visit: Payer: Managed Care, Other (non HMO) | Admitting: Physician Assistant

## 2020-11-12 ENCOUNTER — Encounter: Payer: Self-pay | Admitting: Physician Assistant

## 2020-11-12 ENCOUNTER — Other Ambulatory Visit: Payer: Self-pay

## 2020-11-12 VITALS — BP 130/68 | HR 76 | Ht 69.0 in | Wt 236.0 lb

## 2020-11-12 DIAGNOSIS — I255 Ischemic cardiomyopathy: Secondary | ICD-10-CM

## 2020-11-12 DIAGNOSIS — I2510 Atherosclerotic heart disease of native coronary artery without angina pectoris: Secondary | ICD-10-CM

## 2020-11-12 DIAGNOSIS — I10 Essential (primary) hypertension: Secondary | ICD-10-CM | POA: Diagnosis not present

## 2020-11-12 DIAGNOSIS — I5022 Chronic systolic (congestive) heart failure: Secondary | ICD-10-CM | POA: Diagnosis not present

## 2020-11-12 DIAGNOSIS — E785 Hyperlipidemia, unspecified: Secondary | ICD-10-CM

## 2020-11-12 DIAGNOSIS — Z720 Tobacco use: Secondary | ICD-10-CM

## 2020-11-12 NOTE — Patient Instructions (Signed)
Medication Instructions:  Your physician has recommended you make the following change in your medication:   RESUME the following medications:  1) Aspirin 81 mg daily 2) Wellbutrin SR 150 mg twice a day 3) Carvedilol 3.125 mg twice a day 4) Zetia 10 mg daily 5) Crestor (rosuvastatin) 40 mg daily 6) Nitroglycerin 0.4 mg to be use as needed as directed  Please let us know if you doe not have any of these prescription at home.   *If you need a refill on your cardiac medications before your next appointment, please call your pharmacy*   Lab Work: None ordered  If you have labs (blood work) drawn today and your tests are completely normal, you will receive your results only by: [place] (if you have MyChart) OR A paper copy in the mail If you have any lab test that is abnormal or we need to change your treatment, we will call you to review the results.   Testing/Procedures: None ordered   Follow-Up: At [facility], you and your health needs are our priority.  As part of our continuing mission to provide you with exceptional heart care, we have created designated Provider Care Teams.  These Care Teams include your primary Cardiologist (physician) and Advanced Practice Providers (APPs -  Physician Assistants and Nurse Practitioners) who all work together to provide you with the care you need, when you need it.  We recommend signing up for the patient portal called "MyChart".  Sign up information is provided on this After Visit Summary.  MyChart is used to connect with patients for Virtual Visits (Telemedicine).  Patients are able to view lab/test results, encounter notes, upcoming appointments, etc.  Non-urgent messages can be sent to your provider as well.   To learn more about what you can do with MyChart, go to [email or web address].    Your next appointment:   2 month(s)  The format for your next appointment:   In Person  Provider:   You may see Nelva Bush,  MD or one of the following Advanced Practice Providers on your designated Care Team:   Murray Hodgkins, NP Christell Faith, PA-C Marrianne Mood, PA-C Cadence Kathlen Mody, [place]   Other Instructions N/A

## 2020-12-30 ENCOUNTER — Other Ambulatory Visit: Payer: Self-pay | Admitting: Physician Assistant

## 2021-01-09 NOTE — Progress Notes (Signed)
Cardiology Office Note    Date:  01/10/2021   ID:  Deanna Moran, DOB 1965-03-29, MRN [id number]  PCP:  Jodelle Green, [name]  Cardiologist:  Nelva Bush, MD  Electrophysiologist:  None   Chief Complaint: Follow up  History of Present Illness:   Deanna Moran is a 55 y.o. female with history of CAD with NSTEMI in 06/2016 that was medically managed, HFrEF secondary to ICM, HTN, HLD, Crohn's disease, peripheral neuropathy, and tobacco use who presents for follow-up of her CAD and cardiomyopathy.   She was admitted to the hospital in 06/2016 with an NSTEMI with troponin peaking at 10.78.  LHC at that time demonstrated significant one-vessel CAD with subacute versus CTO of the ostial LAD with left to left and right to left collaterals.  There was moderate nonobstructive stenosis involving the LCx and RCA.  LVEF was 30 to 35% with anterior and apical wall motion abnormality noted.  She was medically managed.  Echo during that admission showed an EF of 40 to 45%, anterior hypokinesis, grade 1 diastolic dysfunction, mild mitral regurgitation, mild left atrial enlargement, and normal RV systolic function and ventricular cavity size.  She has not required ischemic evaluation since.   She was seen in the office in 08/2019, noting stable exertional dyspnea.  She had not been very active outside of working long shifts at work, frequently 14 hours/day.  She was tolerating bupropion in an effort to quit smoking.  She did continue to smoke about half pack per day.  Stressors were making it more difficult for her to quit.  She was last seen in the office in 10/2020 and was doing well from a cardiac perspective.  She was noted to have self discontinued all medications approximately 6 months prior to that visit, indicating she was tired of taking them.  She indicated she planned on resuming her medications following her visit.  She was without symptoms concerning for angina or decompensation.  Despite being  off medications, her BP remained well controlled in the 803O to 122Q systolic.  She did continue to smoke.  She remained very busy at work.  It was recommended she resume aspirin 81 mg, carvedilol 3.125 mg twice daily, rosuvastatin 40 mg, and ezetimibe 10 mg.  She comes in doing well from a cardiac perspective.  She is now back on aspirin, carvedilol, Zetia, HCTZ, and Crestor and tolerating these medications without issues.  No chest pain, dyspnea, palpitations, dizziness, presyncope, or syncope.  No lower extremity swelling or orthopnea.  Her weight remains stable.  She does need a refill of bupropion.  She continues to smoke due to increased stress.  BP at home has been in the 825O to 037C systolic.     Labs independently reviewed: 10/2020 - A1c 5.6, serum creatinine 0.97, TC 224, TG 88, HDL 41, LDL 167 (obtained from patient's phone at recent health fair through [facility]) 02/2019 - TC 108, TG 69, HDL 43, LDL 50, Hgb 14.5, PLT 191, BUN 19, serum creatinine 0.98, potassium 3.7, albumin 4.4, AST/ALT normal 06/2016 - TSH normal   Past Medical History:  Diagnosis Date   Blood in stool    Chicken pox    Chronic back pain    Coronary artery disease    a. NSTEMI 4/18; b. LHC 4/18: LM nl, ost LAD 100% with L-L & R-L collats, LCx nl, OM3 40%, RCA with mild irregs   GERD (gastroesophageal reflux disease)    Heart disease    HLD (hyperlipidemia)  HTN (hypertension)    Ischemic cardiomyopathy    a. echo 4/18: EF 40-45%, ant HK, Gr1DD, mild MR, mild LAE, nl RVSF   NSTEMI (non-ST elevated myocardial infarction) ([place]) 06/2016   Obesity     Past Surgical History:  Procedure Laterality Date   CARDIAC CATHETERIZATION     COLONOSCOPY WITH PROPOFOL N/A 05/13/2018   Procedure: COLONOSCOPY WITH PROPOFOL;  Surgeon: Lin Landsman, MD;  Location: [facility] ENDOSCOPY;  Service: Gastroenterology;  Laterality: N/A;   GIVENS CAPSULE STUDY N/A 10/08/2018   Procedure: GIVENS CAPSULE STUDY;  Surgeon: Lin Landsman, MD;  Location: [facility] ENDOSCOPY;  Service: Gastroenterology;  Laterality: N/A;   LEFT HEART CATH AND CORONARY ANGIOGRAPHY N/A 07/11/2016   Procedure: Left Heart Cath and Coronary Angiography;  Surgeon: Nelva Bush, MD;  Location: [facility] CV LAB;  Service: Cardiovascular;  Laterality: N/A;   TUBAL LIGATION      Current Medications: Current Meds  Medication Sig   ASPIRIN LOW DOSE 81 MG EC tablet TAKE 1 TABLET BY MOUTH  DAILY   carvedilol (COREG) 3.125 MG tablet TAKE 1 TABLET BY MOUTH  TWICE DAILY WITH MEALS (Patient taking differently: TAKE 1 TABLET BY MOUTH  TWICE DAILY WITH MEALS)   ezetimibe (ZETIA) 10 MG tablet TAKE 1 TABLET BY MOUTH  DAILY   gabapentin (NEURONTIN) 100 MG capsule Take 100 mg in the am & 200 mg  in the pm   hydrochlorothiazide (HYDRODIURIL) 25 MG tablet TAKE 1 TABLET(25 MG) BY MOUTH DAILY   lisinopril (ZESTRIL) 10 MG tablet Take 1 tablet (10 mg total) by mouth daily.   naproxen sodium (ANAPROX) 220 MG tablet Take 220 mg by mouth as needed.   nitroGLYCERIN (NITROSTAT) 0.4 MG SL tablet DISSOLVE 1 TABLET ON THE  TONGUE EVERY 5 MINS AS  NEEDED FOR CHEST PAIN MAX 3 TABS IN 15 MINUTES, CALL  911 IF CHEST PAIN PERSISTS (Patient taking differently: DISSOLVE 1 TABLET ON THE  TONGUE EVERY 5 MINS AS  NEEDED FOR CHEST PAIN MAX 3 TABS IN 15 MINUTES, CALL  911 IF CHEST PAIN PERSISTS)   rosuvastatin (CRESTOR) 40 MG tablet TAKE 1 TABLET BY MOUTH  DAILY   [DISCONTINUED] buPROPion (WELLBUTRIN SR) 150 MG 12 hr tablet Take 1 tablet (150 mg total) by mouth 2 (two) times daily.    Allergies:   Patient has no known allergies.   Social History   Socioeconomic History   Marital status: Divorced    Spouse name: Not on file   Number of children: Not on file   Years of education: Not on file   Highest education level: Not on file  Occupational History   Not on file  Tobacco Use   Smoking status: Every Day    Packs/day: 0.50    Years: 38.00    Pack years: 19.00    Types:  Cigarettes   Smokeless tobacco: Never  Vaping Use   Vaping Use: Never used  Substance and Sexual Activity   Alcohol use: Yes    Comment: ocassionally   Drug use: Never   Sexual activity: Not Currently  Other Topics Concern   Not on file  Social History Narrative   Not on file   Social Determinants of Health   Financial Resource Strain: Not on file  Food Insecurity: Not on file  Transportation Needs: Not on file  Physical Activity: Not on file  Stress: Not on file  Social Connections: Not on file     Family History:  The patient's  family history includes Anuerysm in her brother; COPD in her mother; Heart attack in her father.  ROS:   Review of Systems  Constitutional:  Positive for malaise/fatigue. Negative for chills, diaphoresis, fever and weight loss.  HENT:  Negative for congestion.   Eyes:  Negative for discharge and redness.  Respiratory:  Negative for cough, sputum production, shortness of breath and wheezing.   Cardiovascular:  Negative for chest pain, palpitations, orthopnea, claudication, leg swelling and PND.  Gastrointestinal:  Negative for abdominal pain, heartburn, nausea and vomiting.  Musculoskeletal:  Negative for falls and myalgias.  Skin:  Negative for rash.  Neurological:  Negative for dizziness, tingling, tremors, sensory change, speech change, focal weakness, loss of consciousness and weakness.  Endo/Heme/Allergies:  Does not bruise/bleed easily.  Psychiatric/Behavioral:  Negative for substance abuse. The patient is not nervous/anxious.   All other systems reviewed and are negative.   EKGs/Labs/Other Studies Reviewed:    Studies reviewed were summarized above. The additional studies were reviewed today:  LHC 06/2016: Conclusions: Significant single-vessel coronary artery disease with subacute vs. chronic total occlusion of ostial LAD. Distal vessel fills by left-to-left and right-to-left collaterals. Mild to moderate, non-obstructive disease  involving the LCx and RCA. Upper normal left ventricular filling pressure. Moderate to severely reduced LV contraction with mid/apical anterior, apical, and apical inferior akinesis. LVEF 30-35%.   Recommendations: Medical therapy including aspirin and clopidogrel for 12 months. Low-dose beta-blocker, to be uptitrated as heart rate and blood pressure allow. Aggressive secondary prevention, including high-intensity statin therapy and smoking cessation. Add ACEI/ARB as blood pressure allows prior to discharge. Follow-up echocardiogram with particular attention to evaluate for LV apical thrombus. __________   Echo 06/2016: - Procedure narrative: Transthoracic echocardiography. The study    was technically difficult.  - Left ventricle: The cavity size was normal. Systolic function was    mildly to moderately reduced. The estimated ejection fraction was    in the range of 40% to 45%. Hypokinesis of the anterior    myocardium. Hypokinesis of the apical myocardium. Hypokinesis of    the anteroseptal myocardium. Doppler parameters are consistent    with abnormal left ventricular relaxation (grade 1 diastolic    dysfunction).  - Mitral valve: There was mild regurgitation.  - Left atrium: The atrium was mildly dilated.  - Right ventricle: Systolic function was normal.  - Pulmonary arteries: Systolic pressure was within the normal    range.    EKG:  EKG is ordered today.  The EKG ordered today demonstrates NSR, 64 bpm, prior anterior infarct, poor R wave progression along the precordial leads, no acute ST-T changes  Recent Labs: No results found for requested labs within last 8760 hours.  Recent Lipid Panel    Component Value Date/Time   CHOL 108 03/07/2019 0837   TRIG 69 03/07/2019 0837   HDL 43 03/07/2019 0837   CHOLHDL 2.5 03/07/2019 0837   CHOLHDL 4.6 07/11/2016 0459   VLDL 10 07/11/2016 0459   LDLCALC 50 03/07/2019 0837   LDLDIRECT 76 08/30/2016 1108    PHYSICAL EXAM:    VS:   BP (!) 148/90 (BP Location: Left Arm, Patient Position: Sitting, Cuff Size: Normal)   Pulse 64   Ht 5' 9"  (1.753 m)   Wt 238 lb 8 oz (108.2 kg)   SpO2 98%   BMI 35.22 kg/m   BMI: Body mass index is 35.22 kg/m.  Physical Exam Vitals reviewed.  Constitutional:      Appearance: She is well-developed.  HENT:  Head: Normocephalic and atraumatic.  Eyes:     General:        Right eye: No discharge.        Left eye: No discharge.  Neck:     Vascular: No JVD.  Cardiovascular:     Rate and Rhythm: Normal rate and regular rhythm.     Pulses:          Posterior tibial pulses are 2+ on the right side and 2+ on the left side.     Heart sounds: Normal heart sounds, S1 normal and S2 normal. Heart sounds not distant. No midsystolic click and no opening snap. No murmur heard.   No friction rub.  Pulmonary:     Effort: Pulmonary effort is normal. No respiratory distress.     Breath sounds: Normal breath sounds. No decreased breath sounds, wheezing or rales.  Chest:     Chest wall: No tenderness.  Abdominal:     General: There is no distension.     Palpations: Abdomen is soft.     Tenderness: There is no abdominal tenderness.  Musculoskeletal:     Cervical back: Normal range of motion.     Right lower leg: No edema.     Left lower leg: No edema.  Skin:    General: Skin is warm and dry.     Nails: There is no clubbing.  Neurological:     Mental Status: She is alert and oriented to person, place, and time.  Psychiatric:        Speech: Speech normal.        Behavior: Behavior normal.        Thought Content: Thought content normal.        Judgment: Judgment normal.    Wt Readings from Last 3 Encounters:  01/10/21 238 lb 8 oz (108.2 kg)  11/12/20 236 lb (107 kg)  09/17/19 247 lb 6 oz (112.2 kg)     ASSESSMENT & PLAN:   CAD involving the native coronary arteries without angina: She is doing well without symptoms concerning for angina.  Continue current medical therapy including  aspirin, carvedilol, ezetimibe, and rosuvastatin.  Aggressive risk factor modification including complete smoking cessation is recommended.  No indication for ischemic testing at this time.  HFrEF secondary to ICM: She appears euvolemic and well compensated with NYHA class II symptoms.  Add lisinopril 10 mg daily.  Continue carvedilol.  Heart rate precludes escalation of beta-blocker.  When she is seen in follow-up consider addition of SGLT2i or MRA to further escalate GDMT.  Plan to repeat an echo in several months time, following optimization of GDMT, to evaluate for improvement in LV systolic function.  CHF education.  Check CMP through [facility].  HTN: Blood pressure is mildly elevated at triage today with a reading of 148/90.  Recheck BP 138/82.  Add lisinopril as outlined above to further escalate GDMT.  She will otherwise remain on HCTZ and carvedilol.  Heart rate precludes titration of beta-blocker.  Low-sodium diet recommended.  HLD: LDL of 50 in 02/2019 while on statin therapy.  Following self discontinuation of rosuvastatin and ezetimibe, LDL of 167 in 10/2020, with goal LDL being less than 70.  Now back on rosuvastatin 40 mg and ezetimibe.  She will have a CMP, lipid panel, and direct LDL drawn through [facility].  Escalate statin therapy as indicated to achieve target LDL.  Tobacco use: Refill bupropion.  Complete cessation is encouraged.  Disposition: F/u with Dr. Saunders Revel or an APP in  2 months.   Medication Adjustments/Labs and Tests Ordered: Current medicines are reviewed at length with the patient today.  Concerns regarding medicines are outlined above. Medication changes, Labs and Tests ordered today are summarized above and listed in the Patient Instructions accessible in Encounters.   Signed, Christell Faith, PA-C 01/10/2021 2:38 PM     [facility] [address] [place] [address] [place], [place] [address] [phone]

## 2021-01-10 ENCOUNTER — Ambulatory Visit (INDEPENDENT_AMBULATORY_CARE_PROVIDER_SITE_OTHER): Payer: Managed Care, Other (non HMO) | Admitting: Physician Assistant

## 2021-01-10 ENCOUNTER — Encounter: Payer: Self-pay | Admitting: Physician Assistant

## 2021-01-10 ENCOUNTER — Other Ambulatory Visit: Payer: Self-pay

## 2021-01-10 VITALS — BP 148/90 | HR 64 | Ht 69.0 in | Wt 238.5 lb

## 2021-01-10 DIAGNOSIS — I255 Ischemic cardiomyopathy: Secondary | ICD-10-CM

## 2021-01-10 DIAGNOSIS — E785 Hyperlipidemia, unspecified: Secondary | ICD-10-CM

## 2021-01-10 DIAGNOSIS — I5022 Chronic systolic (congestive) heart failure: Secondary | ICD-10-CM

## 2021-01-10 DIAGNOSIS — I2510 Atherosclerotic heart disease of native coronary artery without angina pectoris: Secondary | ICD-10-CM | POA: Diagnosis not present

## 2021-01-10 DIAGNOSIS — I10 Essential (primary) hypertension: Secondary | ICD-10-CM | POA: Diagnosis not present

## 2021-01-10 DIAGNOSIS — Z720 Tobacco use: Secondary | ICD-10-CM

## 2021-01-10 MED ORDER — BUPROPION HCL ER (SR) 150 MG PO TB12: 150.0000 mg | ORAL_TABLET | Freq: Two times a day (BID) | ORAL | 0 refills | Status: DC

## 2021-01-10 MED ORDER — LISINOPRIL 10 MG PO TABS: 10.0000 mg | ORAL_TABLET | Freq: Every day | ORAL | 3 refills | Status: DC

## 2021-01-10 NOTE — Patient Instructions (Signed)
Medication Instructions:  Your physician has recommended you make the following change in your medication:   START Lisinopril 10 mg once daily   *If you need a refill on your cardiac medications before your next appointment, please call your pharmacy*   Lab Work: CMET, lipid, and direct LDL to be done at [facility].  If you have labs (blood work) drawn today and your tests are completely normal, you will receive your results only by: [facility] (if you have MyChart) OR A paper copy in the mail If you have any lab test that is abnormal or we need to change your treatment, we will call you to review the results.   Testing/Procedures: None   Follow-Up: At [facility], you and your health needs are our priority.  As part of our continuing mission to provide you with exceptional heart care, we have created designated Provider Care Teams.  These Care Teams include your primary Cardiologist (physician) and Advanced Practice Providers (APPs -  Physician Assistants and Nurse Practitioners) who all work together to provide you with the care you need, when you need it.   Your next appointment:   2 month(s)  The format for your next appointment:   In Person  Provider:   You may see Nelva Bush, MD or one of the following Advanced Practice Providers on your designated Care Team:   Murray Hodgkins, NP Christell Faith, PA-C Marrianne Mood, PA-C Cadence Polk, [place]

## 2021-01-18 LAB — LIPID PANEL
Chol/HDL Ratio: 4.5 ratio — ABNORMAL HIGH (ref 0.0–4.4)
Cholesterol, Total: 192 mg/dL (ref 100–199)
HDL: 43 mg/dL (ref >39)
LDL Chol Calc (NIH): 127 mg/dL — ABNORMAL HIGH (ref 0–99)
Triglycerides: 123 mg/dL (ref 0–149)
VLDL Cholesterol Cal: 22 mg/dL (ref 5–40)

## 2021-01-18 LAB — COMPREHENSIVE METABOLIC PANEL
ALT: 13 IU/L (ref 0–32)
AST: 16 IU/L (ref 0–40)
Albumin/Globulin Ratio: 1.9 (ref 1.2–2.2)
Albumin: 4.5 g/dL (ref 3.8–4.9)
Alkaline Phosphatase: 90 IU/L (ref 44–121)
BUN/Creatinine Ratio: 16 (ref 9–23)
BUN: 14 mg/dL (ref 6–24)
Bilirubin Total: 0.4 mg/dL (ref 0.0–1.2)
CO2: 22 mmol/L (ref 20–29)
Calcium: 8.9 mg/dL (ref 8.7–10.2)
Chloride: 100 mmol/L (ref 96–106)
Creatinine, Ser: 0.9 mg/dL (ref 0.57–1.00)
Globulin, Total: 2.4 g/dL (ref 1.5–4.5)
Glucose: 122 mg/dL — ABNORMAL HIGH (ref 70–99)
Potassium: 4.5 mmol/L (ref 3.5–5.2)
Sodium: 138 mmol/L (ref 134–144)
Total Protein: 6.9 g/dL (ref 6.0–8.5)
eGFR: 75 mL/min/{1.73_m2} (ref >59)

## 2021-01-18 LAB — LDL CHOLESTEROL, DIRECT: LDL Direct: 122 mg/dL — ABNORMAL HIGH (ref 0–99)

## 2021-01-23 ENCOUNTER — Other Ambulatory Visit: Payer: Self-pay | Admitting: Internal Medicine

## 2021-02-27 NOTE — Progress Notes (Signed)
Cardiology Office Note    Date:  02/28/2021   ID:  Deanna Moran, DOB 05-28-1965, MRN [id number]  PCP:  Jodelle Green, [name]  Cardiologist:  Nelva Bush, MD  Electrophysiologist:  None   Chief Complaint: Follow up  History of Present Illness:   Deanna Moran is a 55 y.o. female with history of CAD with NSTEMI in 06/2016 that was medically managed, HFrEF secondary to ICM, HTN, HLD, Crohn's disease, peripheral neuropathy, and tobacco use who presents for follow-up of her CAD and cardiomyopathy.   She was admitted to the hospital in 06/2016 with an NSTEMI with troponin peaking at 10.78.  LHC at that time demonstrated significant one-vessel CAD with subacute versus CTO of the ostial LAD with left to left and right to left collaterals.  There was moderate nonobstructive stenosis involving the LCx and RCA.  LVEF was 30 to 35% with anterior and apical wall motion abnormality noted.  She was medically managed.  Echo during that admission showed an EF of 40 to 45%, anterior hypokinesis, grade 1 diastolic dysfunction, mild mitral regurgitation, mild left atrial enlargement, and normal RV systolic function and ventricular cavity size.  She has not required ischemic evaluation since.   She was seen in the office in 08/2019, noting stable exertional dyspnea.  She had not been very active outside of working long shifts at work, frequently 14 hours/day.  She was tolerating bupropion in an effort to quit smoking.  She did continue to smoke about half pack per day.  Stressors were making it more difficult for her to quit.   She was seen in the office in 10/2020 and was doing well from a cardiac perspective.  She was noted to have self discontinued all medications approximately 6 months prior to that visit, indicating she was tired of taking them.  She indicated she planned on resuming her medications following her visit.  She was without symptoms concerning for angina or decompensation.  Despite being off  medications, her BP remained well controlled in the 413K to 440N systolic.  She did continue to smoke.  She was last seen in the office on 01/10/2021 and was doing well from a cardiac perspective.  She was back on aspirin, carvedilol, Zetia, HCTZ, and Crestor.  She did continue to smoke due to increased stress.  BP at home was in the 027O to 536U systolic.  She was started on lisinopril 10 mg daily to further optimize GDMT.  She comes in doing well from a cardiac perspective.  No chest pain, dyspnea, palpitations, dizziness, presyncope, or syncope.  She remains very busy at work.  Her weight is down 11 pounds today when compared to her last clinic visit.  She has also decreased her tobacco use with her last carton of cigarettes being purchased in early 01/2021.  She is tolerating cardiac medication without issues.  No falls, hematochezia, or melena.  She is very pleased with her improvement.   Labs independently reviewed: 12/2020 - BUN 14, serum creatinine 0.9, potassium 4.5, albumin 4.5, AST/ALT normal, TC 192, TG 123, HDL 43, LDL 127, direct LDL 122 02/2019 - Hgb 14.5, PLT 191 06/2016 - TSH normal  Past Medical History:  Diagnosis Date   Blood in stool    Chicken pox    Chronic back pain    Coronary artery disease    a. NSTEMI 4/18; b. LHC 4/18: LM nl, ost LAD 100% with L-L & R-L collats, LCx nl, OM3 40%, RCA with mild irregs  GERD (gastroesophageal reflux disease)    Heart disease    HLD (hyperlipidemia)    HTN (hypertension)    Ischemic cardiomyopathy    a. echo 4/18: EF 40-45%, ant HK, Gr1DD, mild MR, mild LAE, nl RVSF   NSTEMI (non-ST elevated myocardial infarction) ([place]) 06/2016   Obesity     Past Surgical History:  Procedure Laterality Date   CARDIAC CATHETERIZATION     COLONOSCOPY WITH PROPOFOL N/A 05/13/2018   Procedure: COLONOSCOPY WITH PROPOFOL;  Surgeon: Lin Landsman, MD;  Location: [facility] ENDOSCOPY;  Service: Gastroenterology;  Laterality: N/A;   GIVENS CAPSULE STUDY  N/A 10/08/2018   Procedure: GIVENS CAPSULE STUDY;  Surgeon: Lin Landsman, MD;  Location: [facility] ENDOSCOPY;  Service: Gastroenterology;  Laterality: N/A;   LEFT HEART CATH AND CORONARY ANGIOGRAPHY N/A 07/11/2016   Procedure: Left Heart Cath and Coronary Angiography;  Surgeon: Nelva Bush, MD;  Location: [facility];  Service: Cardiovascular;  Laterality: N/A;   TUBAL LIGATION      Current Medications: Current Meds  Medication Sig   ASPIRIN LOW DOSE 81 MG EC tablet TAKE 1 TABLET BY MOUTH  DAILY   buPROPion (WELLBUTRIN SR) 150 MG 12 hr tablet Take 1 tablet (150 mg total) by mouth 2 (two) times daily.   carvedilol (COREG) 3.125 MG tablet TAKE 1 TABLET BY MOUTH  TWICE DAILY WITH MEALS (Patient taking differently: TAKE 1 TABLET BY MOUTH  TWICE DAILY WITH MEALS)   ezetimibe (ZETIA) 10 MG tablet TAKE 1 TABLET BY MOUTH  DAILY   gabapentin (NEURONTIN) 100 MG capsule Take 100 mg in the am & 200 mg  in the pm   hydrochlorothiazide (HYDRODIURIL) 25 MG tablet TAKE 1 TABLET(25 MG) BY MOUTH DAILY   lisinopril (ZESTRIL) 10 MG tablet Take 1 tablet (10 mg total) by mouth daily.   naproxen sodium (ANAPROX) 220 MG tablet Take 220 mg by mouth as needed.   nitroGLYCERIN (NITROSTAT) 0.4 MG SL tablet DISSOLVE 1 TABLET ON THE  TONGUE EVERY 5 MINS AS  NEEDED FOR CHEST PAIN MAX 3 TABS IN 15 MINUTES, CALL  911 IF CHEST PAIN PERSISTS (Patient taking differently: DISSOLVE 1 TABLET ON THE  TONGUE EVERY 5 MINS AS  NEEDED FOR CHEST PAIN MAX 3 TABS IN 15 MINUTES, CALL  911 IF CHEST PAIN PERSISTS)   rosuvastatin (CRESTOR) 40 MG tablet TAKE 1 TABLET BY MOUTH  DAILY    Allergies:   Patient has no known allergies.   Social History   Socioeconomic History   Marital status: Divorced    Spouse name: Not on file   Number of children: Not on file   Years of education: Not on file   Highest education level: Not on file  Occupational History   Not on file  Tobacco Use   Smoking status: Every Day    Packs/day:  0.50    Years: 38.00    Pack years: 19.00    Types: Cigarettes   Smokeless tobacco: Never  Vaping Use   Vaping Use: Never used  Substance and Sexual Activity   Alcohol use: Yes    Comment: ocassionally   Drug use: Never   Sexual activity: Not Currently  Other Topics Concern   Not on file  Social History Narrative   Not on file   Social Determinants of Health   Financial Resource Strain: Not on file  Food Insecurity: Not on file  Transportation Needs: Not on file  Physical Activity: Not on file  Stress: Not on  file  Social Connections: Not on file     Family History:  The patient's family history includes Anuerysm in her brother; COPD in her mother; Heart attack in her father.  ROS:   Review of Systems  Constitutional:  Positive for malaise/fatigue. Negative for chills, diaphoresis, fever and weight loss.  HENT:  Negative for congestion.   Eyes:  Negative for discharge and redness.  Respiratory:  Negative for cough, sputum production, shortness of breath and wheezing.   Cardiovascular:  Negative for chest pain, palpitations, orthopnea, claudication, leg swelling and PND.  Gastrointestinal:  Negative for abdominal pain, blood in stool, heartburn, melena, nausea and vomiting.  Musculoskeletal:  Negative for falls and myalgias.  Skin:  Negative for rash.  Neurological:  Negative for dizziness, tingling, tremors, sensory change, speech change, focal weakness, loss of consciousness and weakness.  Endo/Heme/Allergies:  Does not bruise/bleed easily.  Psychiatric/Behavioral:  Negative for substance abuse. The patient is not nervous/anxious.   All other systems reviewed and are negative.   EKGs/Labs/Other Studies Reviewed:    Studies reviewed were summarized above. The additional studies were reviewed today:  LHC 06/2016: Conclusions: Significant single-vessel coronary artery disease with subacute vs. chronic total occlusion of ostial LAD. Distal vessel fills by left-to-left  and right-to-left collaterals. Mild to moderate, non-obstructive disease involving the LCx and RCA. Upper normal left ventricular filling pressure. Moderate to severely reduced LV contraction with mid/apical anterior, apical, and apical inferior akinesis. LVEF 30-35%.   Recommendations: Medical therapy including aspirin and clopidogrel for 12 months. Low-dose beta-blocker, to be uptitrated as heart rate and blood pressure allow. Aggressive secondary prevention, including high-intensity statin therapy and smoking cessation. Add ACEI/ARB as blood pressure allows prior to discharge. Follow-up echocardiogram with particular attention to evaluate for LV apical thrombus. __________   Echo 06/2016: - Procedure narrative: Transthoracic echocardiography. The study    was technically difficult.  - Left ventricle: The cavity size was normal. Systolic function was    mildly to moderately reduced. The estimated ejection fraction was    in the range of 40% to 45%. Hypokinesis of the anterior    myocardium. Hypokinesis of the apical myocardium. Hypokinesis of    the anteroseptal myocardium. Doppler parameters are consistent    with abnormal left ventricular relaxation (grade 1 diastolic    dysfunction).  - Mitral valve: There was mild regurgitation.  - Left atrium: The atrium was mildly dilated.  - Right ventricle: Systolic function was normal.  - Pulmonary arteries: Systolic pressure was within the normal    range.     EKG:  EKG is not ordered today.    Recent Labs: 01/17/2021: ALT 13; BUN 14; Creatinine, Ser 0.90; Potassium 4.5; Sodium 138  Recent Lipid Panel    Component Value Date/Time   CHOL 192 01/17/2021 1039   TRIG 123 01/17/2021 1039   HDL 43 01/17/2021 1039   CHOLHDL 4.5 (H) 01/17/2021 1039   CHOLHDL 4.6 07/11/2016 0459   VLDL 10 07/11/2016 0459   LDLCALC 127 (H) 01/17/2021 1039   LDLDIRECT 122 (H) 01/17/2021 1039    PHYSICAL EXAM:    VS:  BP 120/60 (BP Location: Left Arm,  Patient Position: Sitting, Cuff Size: Normal)   Pulse 89   Ht 5' 9"  (1.753 m)   Wt 229 lb 6 oz (104 kg)   SpO2 98%   BMI 33.87 kg/m   BMI: Body mass index is 33.87 kg/m.  Physical Exam Vitals reviewed.  Constitutional:      Appearance: She is  well-developed.  HENT:     Head: Normocephalic and atraumatic.  Eyes:     General:        Right eye: No discharge.        Left eye: No discharge.  Neck:     Vascular: No JVD.  Cardiovascular:     Rate and Rhythm: Normal rate and regular rhythm.     Pulses:          Posterior tibial pulses are 2+ on the right side and 2+ on the left side.     Heart sounds: Normal heart sounds, S1 normal and S2 normal. Heart sounds not distant. No midsystolic click and no opening snap. No murmur heard.   No friction rub.  Pulmonary:     Effort: Pulmonary effort is normal. No respiratory distress.     Breath sounds: Normal breath sounds. No decreased breath sounds, wheezing or rales.  Chest:     Chest wall: No tenderness.  Abdominal:     General: There is no distension.     Palpations: Abdomen is soft.     Tenderness: There is no abdominal tenderness.  Musculoskeletal:     Cervical back: Normal range of motion.     Right lower leg: No edema.     Left lower leg: No edema.  Skin:    General: Skin is warm and dry.     Nails: There is no clubbing.  Neurological:     Mental Status: She is alert and oriented to person, place, and time.  Psychiatric:        Speech: Speech normal.        Behavior: Behavior normal.        Thought Content: Thought content normal.        Judgment: Judgment normal.    Wt Readings from Last 3 Encounters:  02/28/21 229 lb 6 oz (104 kg)  01/10/21 238 lb 8 oz (108.2 kg)  11/12/20 236 lb (107 kg)     ASSESSMENT & PLAN:   CAD involving the native coronary arteries without angina: She is doing well without any symptoms concerning for angina.  Continue current medical therapy including aspirin, carvedilol, rosuvastatin,  ezetimibe, and lisinopril.  Aggressive risk factor modification including complete smoking cessation is encouraged.  No indication for ischemic testing at this time.  HFrEF secondary to ICM: She appears euvolemic and well compensated with NYHA class II symptoms.  Continue carvedilol and lisinopril.  We will obtain an echo to reevaluate her LV systolic function.  If her mild cardiomyopathy persists would recommend further escalation of GDMT with possible transition to [facility] along with addition of SGLT2i and/or MRA.  Recent labs showed stable renal function and potassium.  HTN: Blood pressure is well controlled in the office today.  She remains on carvedilol, HCTZ, and lisinopril.  HLD: LDL 122.  Goal LDL less than 70.  She remains on rosuvastatin 40 mg and ezetimibe.  She would like to continue current medical therapy with recheck lipid panel in several months time with deferment of lipid clinic at this time.  Tobacco use: She has tapered use and congratulations were offered.  Continued tapering and complete cessation are encouraged.    Disposition: F/u with Dr. Saunders Revel or an APP in 8 weeks.   Medication Adjustments/Labs and Tests Ordered: Current medicines are reviewed at length with the patient today.  Concerns regarding medicines are outlined above. Medication changes, Labs and Tests ordered today are summarized above and listed in the Patient Instructions accessible  in Encounters.   Signed, Christell Faith, PA-C 02/28/2021 3:48 PM     [address], [place] [address] [phone]

## 2021-02-28 ENCOUNTER — Ambulatory Visit: Payer: Managed Care, Other (non HMO) | Admitting: Physician Assistant

## 2021-02-28 ENCOUNTER — Other Ambulatory Visit: Payer: Self-pay

## 2021-02-28 ENCOUNTER — Encounter: Payer: Self-pay | Admitting: Physician Assistant

## 2021-02-28 VITALS — BP 120/60 | HR 89 | Ht 69.0 in | Wt 229.4 lb

## 2021-02-28 DIAGNOSIS — I255 Ischemic cardiomyopathy: Secondary | ICD-10-CM | POA: Diagnosis not present

## 2021-02-28 DIAGNOSIS — E785 Hyperlipidemia, unspecified: Secondary | ICD-10-CM

## 2021-02-28 DIAGNOSIS — I10 Essential (primary) hypertension: Secondary | ICD-10-CM

## 2021-02-28 DIAGNOSIS — I2510 Atherosclerotic heart disease of native coronary artery without angina pectoris: Secondary | ICD-10-CM | POA: Diagnosis not present

## 2021-02-28 DIAGNOSIS — I5022 Chronic systolic (congestive) heart failure: Secondary | ICD-10-CM | POA: Diagnosis not present

## 2021-02-28 DIAGNOSIS — Z720 Tobacco use: Secondary | ICD-10-CM

## 2021-02-28 NOTE — Patient Instructions (Addendum)
Medication Instructions:  No changes at this time.  *If you need a refill on your cardiac medications before your next appointment, please call your pharmacy*   Lab Work: None  If you have labs (blood work) drawn today and your tests are completely normal, you will receive your results only by: [place] (if you have MyChart) OR A paper copy in the mail If you have any lab test that is abnormal or we need to change your treatment, we will call you to review the results.   Testing/Procedures: Your physician has requested that you have an echocardiogram after the first of the year.   Echocardiography is a painless test that uses sound waves to create images of your heart. It provides your doctor with information about the size and shape of your heart and how well your heart's chambers and valves are working. This procedure takes approximately one hour. There are no restrictions for this procedure.    Follow-Up: At [facility], you and your health needs are our priority.  As part of our continuing mission to provide you with exceptional heart care, we have created designated Provider Care Teams.  These Care Teams include your primary Cardiologist (physician) and Advanced Practice Providers (APPs -  Physician Assistants and Nurse Practitioners) who all work together to provide you with the care you need, when you need it.   Your next appointment:   2 month(s) from now but after echocardiogram has been done.  The format for your next appointment:   In Person  Provider:   Nelva Bush, MD or Christell Faith, PA-C

## 2021-03-03 ENCOUNTER — Ambulatory Visit: Payer: Managed Care, Other (non HMO) | Admitting: Internal Medicine

## 2021-04-13 ENCOUNTER — Ambulatory Visit (INDEPENDENT_AMBULATORY_CARE_PROVIDER_SITE_OTHER): Payer: Managed Care, Other (non HMO)

## 2021-04-13 ENCOUNTER — Other Ambulatory Visit: Payer: Self-pay

## 2021-04-13 DIAGNOSIS — I255 Ischemic cardiomyopathy: Secondary | ICD-10-CM | POA: Diagnosis not present

## 2021-04-13 MED ORDER — PERFLUTREN LIPID MICROSPHERE
1.0000 mL | INTRAVENOUS | Status: AC | PRN
  Administered 2021-04-13: 4 mL via INTRAVENOUS

## 2021-04-14 LAB — ECHOCARDIOGRAM COMPLETE
AR max vel: 3.18 cm2
AV Area VTI: 3.05 cm2
AV Area mean vel: 2.87 cm2
AV Mean grad: 3 mmHg
AV Peak grad: 4.9 mmHg
Ao pk vel: 1.11 m/s
Area-P 1/2: 3.37 cm2
S' Lateral: 2.8 cm

## 2021-05-04 NOTE — Progress Notes (Signed)
Cardiology Office Note    Date:  05/05/2021   ID:  [name], DOB Feb 14, 1966, MRN [id number]  PCP:  Jodelle Green, [name]  Cardiologist:  Nelva Bush, MD  Electrophysiologist:  None   Chief Complaint: Follow-up  History of Present Illness:   Deanna Moran is a 56 y.o. female with history of CAD with NSTEMI in 06/2016 that was medically managed, HFrEF secondary to ICM, HTN, HLD, Crohn's disease, peripheral neuropathy, and tobacco use who presents for follow-up of her CAD and cardiomyopathy.   She was admitted to the hospital in 06/2016 with an NSTEMI with troponin peaking at 10.78.  LHC at that time demonstrated significant one-vessel CAD with subacute versus CTO of the ostial LAD with left to left and right to left collaterals.  There was moderate nonobstructive stenosis involving the LCx and RCA.  LVEF was 30 to 35% with anterior and apical wall motion abnormality noted.  She was medically managed.  Echo during that admission showed an EF of 40 to 45%, anterior hypokinesis, grade 1 diastolic dysfunction, mild mitral regurgitation, mild left atrial enlargement, and normal RV systolic function and ventricular cavity size.  She has not required ischemic evaluation since.   She was seen in the office in 08/2019, noting stable exertional dyspnea.  She had not been very active outside of working long shifts at work, frequently 14 hours/day.  She was tolerating bupropion in an effort to quit smoking.  She did continue to smoke about half pack per day.  Stressors were making it more difficult for her to quit.   She was seen in the office in 10/2020 and was doing well from a cardiac perspective.  She was noted to have self discontinued all medications approximately 6 months prior to that visit, indicating she was tired of taking them.  She indicated she planned on resuming her medications following her visit.  She was without symptoms concerning for angina or decompensation.  Despite being off  medications, her BP remained well controlled in the 093O to 671I systolic.  She did continue to smoke.  She was seen in the office on 01/10/2021 and was doing well from a cardiac perspective.  She was back on aspirin, carvedilol, Zetia, HCTZ, and Crestor.  She did continue to smoke due to increased stress.  BP at home was in the 458K to 998P systolic.  She was started on lisinopril 10 mg daily to further optimize GDMT.  She was last seen in the office on 02/28/2021 and continued to do well from a cardiac perspective, without symptoms of angina or decompensation.  Her weight was down 11 pounds when compared to her prior clinic visit.  She had also decreased her tobacco use.  She underwent repeat echo, on medical therapy, on 04/13/2021 which demonstrated an improved EF of 45 to 50%, hypokinesis of the anterior, anteroseptal, and apical region, grade 1 diastolic dysfunction, normal RV systolic function and ventricular cavity size, mildly dilated left atrium, mild mitral regurgitation, and an estimated right atrial pressure of 3 mmHg.  She comes in continuing to do well from a cardiac perspective without symptoms of angina or decompensation.  No dizziness, presyncope, or syncope.  No palpitations or lower extremity swelling.  Her weight is up 11 pounds today when compared to her last clinic visit, at which time it was down 11 pounds.  She attributes this to increased high caloric snacking in the context of decreased tobacco use.  She is beginning to work on improving her  diet.  Her blood pressure is mildly elevated in the office today, though she attributes this to increased stress and after having a long day at work.  No falls, hematochezia, or melena.   Labs independently reviewed: 12/2020 - BUN 14, serum creatinine 0.9, potassium 4.5, albumin 4.5, AST/ALT normal, TC 192, TG 123, HDL 43, LDL 127, direct LDL 122 02/2019 - Hgb 14.5, PLT 191 06/2016 - TSH normal  Past Medical History:  Diagnosis Date   Blood  in stool    Chicken pox    Chronic back pain    Coronary artery disease    a. NSTEMI 4/18; b. LHC 4/18: LM nl, ost LAD 100% with L-L & R-L collats, LCx nl, OM3 40%, RCA with mild irregs   GERD (gastroesophageal reflux disease)    Heart disease    HLD (hyperlipidemia)    HTN (hypertension)    Ischemic cardiomyopathy    a. echo 4/18: EF 40-45%, ant HK, Gr1DD, mild MR, mild LAE, nl RVSF   NSTEMI (non-ST elevated myocardial infarction) ([place]) 06/2016   Obesity     Past Surgical History:  Procedure Laterality Date   CARDIAC CATHETERIZATION     COLONOSCOPY WITH PROPOFOL N/A 05/13/2018   Procedure: COLONOSCOPY WITH PROPOFOL;  Surgeon: Lin Landsman, MD;  Location: [facility] ENDOSCOPY;  Service: Gastroenterology;  Laterality: N/A;   GIVENS CAPSULE STUDY N/A 10/08/2018   Procedure: GIVENS CAPSULE STUDY;  Surgeon: Lin Landsman, MD;  Location: [facility] ENDOSCOPY;  Service: Gastroenterology;  Laterality: N/A;   LEFT HEART CATH AND CORONARY ANGIOGRAPHY N/A 07/11/2016   Procedure: Left Heart Cath and Coronary Angiography;  Surgeon: Nelva Bush, MD;  Location: [facility];  Service: Cardiovascular;  Laterality: N/A;   TUBAL LIGATION      Current Medications: Current Meds  Medication Sig   ASPIRIN LOW DOSE 81 MG EC tablet TAKE 1 TABLET BY MOUTH  DAILY   buPROPion (WELLBUTRIN SR) 150 MG 12 hr tablet Take 1 tablet (150 mg total) by mouth 2 (two) times daily.   carvedilol (COREG) 3.125 MG tablet TAKE 1 TABLET BY MOUTH  TWICE DAILY WITH MEALS   ezetimibe (ZETIA) 10 MG tablet TAKE 1 TABLET BY MOUTH  DAILY   gabapentin (NEURONTIN) 100 MG capsule Take 100 mg in the am & 200 mg  in the pm   hydrochlorothiazide (HYDRODIURIL) 25 MG tablet TAKE 1 TABLET(25 MG) BY MOUTH DAILY   lisinopril (ZESTRIL) 10 MG tablet Take 1 tablet (10 mg total) by mouth daily.   naproxen sodium (ANAPROX) 220 MG tablet Take 220 mg by mouth as needed.   nitroGLYCERIN (NITROSTAT) 0.4 MG SL tablet DISSOLVE 1 TABLET ON  THE  TONGUE EVERY 5 MINS AS  NEEDED FOR CHEST PAIN MAX 3 TABS IN 15 MINUTES, CALL  911 IF CHEST PAIN PERSISTS (Patient taking differently: DISSOLVE 1 TABLET ON THE  TONGUE EVERY 5 MINS AS  NEEDED FOR CHEST PAIN MAX 3 TABS IN 15 MINUTES, CALL  911 IF CHEST PAIN PERSISTS)   rosuvastatin (CRESTOR) 40 MG tablet TAKE 1 TABLET BY MOUTH  DAILY    Allergies:   Patient has no known allergies.   Social History   Socioeconomic History   Marital status: Divorced    Spouse name: Not on file   Number of children: Not on file   Years of education: Not on file   Highest education level: Not on file  Occupational History   Not on file  Tobacco Use   Smoking status:  Every Day    Packs/day: 0.50    Years: 38.00    Pack years: 19.00    Types: Cigarettes   Smokeless tobacco: Never  Vaping Use   Vaping Use: Never used  Substance and Sexual Activity   Alcohol use: Yes    Comment: ocassionally   Drug use: Never   Sexual activity: Not Currently  Other Topics Concern   Not on file  Social History Narrative   Not on file   Social Determinants of Health   Financial Resource Strain: Not on file  Food Insecurity: Not on file  Transportation Needs: Not on file  Physical Activity: Not on file  Stress: Not on file  Social Connections: Not on file     Family History:  The patient's family history includes Anuerysm in her brother; COPD in her mother; Heart attack in her father.  ROS:   Review of Systems  Constitutional:  Positive for malaise/fatigue. Negative for chills, diaphoresis, fever and weight loss.  HENT:  Negative for congestion.   Eyes:  Negative for discharge and redness.  Respiratory:  Negative for cough, sputum production, shortness of breath and wheezing.   Cardiovascular:  Negative for chest pain, palpitations, orthopnea, claudication, leg swelling and PND.  Gastrointestinal:  Negative for abdominal pain, blood in stool, heartburn, melena, nausea and vomiting.  Musculoskeletal:   Negative for falls and myalgias.  Skin:  Negative for rash.  Neurological:  Negative for dizziness, tingling, tremors, sensory change, speech change, focal weakness, loss of consciousness and weakness.  Endo/Heme/Allergies:  Does not bruise/bleed easily.  Psychiatric/Behavioral:  Negative for substance abuse. The patient is not nervous/anxious.   All other systems reviewed and are negative.   EKGs/Labs/Other Studies Reviewed:    Studies reviewed were summarized above. The additional studies were reviewed today:  LHC 06/2016: Conclusions: Significant single-vessel coronary artery disease with subacute vs. chronic total occlusion of ostial LAD. Distal vessel fills by left-to-left and right-to-left collaterals. Mild to moderate, non-obstructive disease involving the LCx and RCA. Upper normal left ventricular filling pressure. Moderate to severely reduced LV contraction with mid/apical anterior, apical, and apical inferior akinesis. LVEF 30-35%.   Recommendations: Medical therapy including aspirin and clopidogrel for 12 months. Low-dose beta-blocker, to be uptitrated as heart rate and blood pressure allow. Aggressive secondary prevention, including high-intensity statin therapy and smoking cessation. Add ACEI/ARB as blood pressure allows prior to discharge. Follow-up echocardiogram with particular attention to evaluate for LV apical thrombus. __________   Echo 06/2016: - Procedure narrative: Transthoracic echocardiography. The study    was technically difficult.  - Left ventricle: The cavity size was normal. Systolic function was    mildly to moderately reduced. The estimated ejection fraction was    in the range of 40% to 45%. Hypokinesis of the anterior    myocardium. Hypokinesis of the apical myocardium. Hypokinesis of    the anteroseptal myocardium. Doppler parameters are consistent    with abnormal left ventricular relaxation (grade 1 diastolic    dysfunction).  - Mitral valve:  There was mild regurgitation.  - Left atrium: The atrium was mildly dilated.  - Right ventricle: Systolic function was normal.  - Pulmonary arteries: Systolic pressure was within the normal    range.    EKG:  EKG is ordered today.  The EKG ordered today demonstrates NSR, 70 bpm, incomplete RBBB, no significant change when compared to prior tracing  Recent Labs: 01/17/2021: ALT 13; BUN 14; Creatinine, Ser 0.90; Potassium 4.5; Sodium 138  Recent Lipid Panel  Component Value Date/Time   CHOL 192 01/17/2021 1039   TRIG 123 01/17/2021 1039   HDL 43 01/17/2021 1039   CHOLHDL 4.5 (H) 01/17/2021 1039   CHOLHDL 4.6 07/11/2016 0459   VLDL 10 07/11/2016 0459   LDLCALC 127 (H) 01/17/2021 1039   LDLDIRECT 122 (H) 01/17/2021 1039    PHYSICAL EXAM:    VS:  BP 140/80 (BP Location: Left Arm, Patient Position: Sitting, Cuff Size: Normal)    Pulse 70    Ht 5\' 10"  (1.778 m)    Wt 240 lb 6 oz (109 kg)    SpO2 97%    BMI 34.49 kg/m   BMI: Body mass index is 34.49 kg/m.  Physical Exam Vitals reviewed.  Constitutional:      Appearance: She is well-developed.  HENT:     Head: Normocephalic and atraumatic.  Eyes:     General:        Right eye: No discharge.        Left eye: No discharge.  Neck:     Vascular: No JVD.  Cardiovascular:     Rate and Rhythm: Normal rate and regular rhythm.     Pulses:          Posterior tibial pulses are 2+ on the right side and 2+ on the left side.     Heart sounds: Normal heart sounds, S1 normal and S2 normal. Heart sounds not distant. No midsystolic click and no opening snap. No murmur heard.   No friction rub.  Pulmonary:     Effort: Pulmonary effort is normal. No respiratory distress.     Breath sounds: Normal breath sounds. No decreased breath sounds, wheezing or rales.  Chest:     Chest wall: No tenderness.  Abdominal:     General: There is no distension.     Palpations: Abdomen is soft.     Tenderness: There is no abdominal tenderness.   Musculoskeletal:     Cervical back: Normal range of motion.     Right lower leg: No edema.     Left lower leg: No edema.  Skin:    General: Skin is warm and dry.     Nails: There is no clubbing.  Neurological:     Mental Status: She is alert and oriented to person, place, and time.  Psychiatric:        Speech: Speech normal.        Behavior: Behavior normal.        Thought Content: Thought content normal.        Judgment: Judgment normal.    Wt Readings from Last 3 Encounters:  05/05/21 240 lb 6 oz (109 kg)  02/28/21 229 lb 6 oz (104 kg)  01/10/21 238 lb 8 oz (108.2 kg)     ASSESSMENT & PLAN:   CAD involving the native coronary arteries without angina: She is doing well without symptoms concerning for angina.  Continue current medical therapy including aspirin, carvedilol, rosuvastatin, ezetimibe, and lisinopril.  Aggressive risk factor modification including complete smoking cessation is encouraged.  No indication for further ischemic testing at this time.  HFrEF secondary to ICM: She appears euvolemic and well compensated with NYHA class II symptoms.  Continue carvedilol and lisinopril.  Recent echo from 02/2021 demonstrated continued improvement in her LV systolic function with an EF of 50% now.  Not requiring a standing loop diuretic.  She has preferred to minimize pharmacotherapy.  CHF education.  HTN: Blood pressure is mildly elevated in the office  today, though this is in the setting of increased rest and a long workday.  Continue current pharmacotherapy as outlined above.  Continue to monitor.  HLD: LDL 122.  She prefers to defer lipid testing at this time given increased high caloric snacking.  She will work on improving her diet.  Plan to update fasting lipid panel and liver function in follow-up in 6 months.  Tobacco use: She continues to taper her tobacco use.  Complete cessation is encouraged.   Disposition: F/u with Dr. Saunders Revel or an APP in 6 months.   Medication  Adjustments/Labs and Tests Ordered: Current medicines are reviewed at length with the patient today.  Concerns regarding medicines are outlined above. Medication changes, Labs and Tests ordered today are summarized above and listed in the Patient Instructions accessible in Encounters.   Signed, Christell Faith, PA-C 05/05/2021 4:42 PM     [place] [address] [place] [address] [place], [place] [address] [phone]

## 2021-05-05 ENCOUNTER — Other Ambulatory Visit: Payer: Self-pay

## 2021-05-05 ENCOUNTER — Encounter: Payer: Self-pay | Admitting: Physician Assistant

## 2021-05-05 ENCOUNTER — Ambulatory Visit: Payer: Managed Care, Other (non HMO) | Admitting: Physician Assistant

## 2021-05-05 VITALS — BP 140/80 | HR 70 | Ht 70.0 in | Wt 240.4 lb

## 2021-05-05 DIAGNOSIS — I2510 Atherosclerotic heart disease of native coronary artery without angina pectoris: Secondary | ICD-10-CM | POA: Diagnosis not present

## 2021-05-05 DIAGNOSIS — I5022 Chronic systolic (congestive) heart failure: Secondary | ICD-10-CM | POA: Diagnosis not present

## 2021-05-05 DIAGNOSIS — I255 Ischemic cardiomyopathy: Secondary | ICD-10-CM

## 2021-05-05 DIAGNOSIS — I10 Essential (primary) hypertension: Secondary | ICD-10-CM | POA: Diagnosis not present

## 2021-05-05 DIAGNOSIS — Z720 Tobacco use: Secondary | ICD-10-CM

## 2021-05-05 DIAGNOSIS — E785 Hyperlipidemia, unspecified: Secondary | ICD-10-CM

## 2021-05-05 NOTE — Patient Instructions (Signed)
Medication Instructions:  ?No changes at this time.  ? ?*If you need a refill on your cardiac medications before your next appointment, please call your pharmacy* ? ? ?Lab Work: ?None ? ?If you have labs (blood work) drawn today and your tests are completely normal, you will receive your results only by: ?MyChart Message (if you have MyChart) OR ?A paper copy in the mail ?If you have any lab test that is abnormal or we need to change your treatment, we will call you to review the results. ? ? ?Testing/Procedures: ?None ? ? ?Follow-Up: ?At [facility], you and your health needs are our priority.  As part of our continuing mission to provide you with exceptional heart care, we have created designated Provider Care Teams.  These Care Teams include your primary Cardiologist (physician) and Advanced Practice Providers (APPs -  Physician Assistants and Nurse Practitioners) who all work together to provide you with the care you need, when you need it. ? ? ?Your next appointment:   ?6 month(s) ? ?The format for your next appointment:   ?In Person ? ?Provider:   ?Christopher End, MD or Ryan Dunn, PA-C ?

## 2021-05-06 ENCOUNTER — Other Ambulatory Visit: Payer: Self-pay | Admitting: Internal Medicine

## 2021-11-16 NOTE — Progress Notes (Signed)
Cardiology Office Note    Date:  11/18/2021   ID:  Deanna Moran, DOB 26-Nov-1965, MRN [id number]  PCP:  Jodelle Green, [name]  Cardiologist:  Nelva Bush, MD  Electrophysiologist:  None   Chief Complaint: Follow up  History of Present Illness:   Deanna Moran is a 56 y.o. female with history of CAD with NSTEMI in 06/2016 that was medically managed, HFrEF secondary to ICM, HTN, HLD, Crohn's disease, peripheral neuropathy, and tobacco use who presents for follow-up of her CAD and cardiomyopathy.   She was admitted to the hospital in 06/2016 with an NSTEMI with troponin peaking at 10.78.  LHC at that time demonstrated significant one-vessel CAD with subacute versus CTO of the ostial LAD with left to left and right to left collaterals.  There was moderate nonobstructive stenosis involving the LCx and RCA.  LVEF was 30 to 35% with anterior and apical wall motion abnormality noted.  She was medically managed.  Echo during that admission showed an EF of 40 to 45%, anterior hypokinesis, grade 1 diastolic dysfunction, mild mitral regurgitation, mild left atrial enlargement, and normal RV systolic function and ventricular cavity size.  She has not required ischemic evaluation since.   She was seen in the office in 08/2019, noting stable exertional dyspnea.  She had not been very active outside of working long shifts at work, frequently 14 hours/day.  She was tolerating bupropion in an effort to quit smoking.  She did continue to smoke about half pack per day.  Stressors were making it more difficult for her to quit.   She was seen in the office in 10/2020 and was doing well from a cardiac perspective.  She was noted to have self discontinued all medications approximately 6 months prior to that visit, indicating she was tired of taking them.  She indicated she planned on resuming her medications following her visit.  She was without symptoms concerning for angina or decompensation.  Despite being off  medications, her BP remained well controlled in the 338S to 505L systolic.  She did continue to smoke.  She was seen in the office on 01/10/2021 and was doing well from a cardiac perspective.  She was back on aspirin, carvedilol, Zetia, HCTZ, and Crestor.  She did continue to smoke due to increased stress.  BP at home was in the 976B to 341P systolic.  She was started on lisinopril 10 mg daily to further optimize GDMT.   She was seen in the office on 02/28/2021 and continued to do well from a cardiac perspective, without symptoms of angina or decompensation.  Her weight was down 11 pounds when compared to her prior clinic visit.  She had also decreased her tobacco use.  She underwent repeat echo, on medical therapy, on 04/13/2021 which demonstrated an improved EF of 45 to 50%, hypokinesis of the anterior, anteroseptal, and apical region, grade 1 diastolic dysfunction, normal RV systolic function and ventricular cavity size, mildly dilated left atrium, mild mitral regurgitation, and an estimated right atrial pressure of 3 mmHg.  She was last seen in the office in 04/2021 and remained without symptoms of angina or decompensation.  Her weight was up 11 pounds when compared to her prior visit, which she attributed to increased caloric intake in the context of decreased tobacco use.    She comes in doing well from a cardiac perspective and is without symptoms of angina or decompensation.  No dizziness, presyncope, or syncope.  No palpitations or lower extremity swelling.  Her weight is down 11 pounds today when compared to her last clinic visit.  She has significantly tried to improve her diet and cut out as much sugar as possible.  She does continue to have some increased stress at home.  No falls or symptoms concerning for bleeding.  She continues to smoke with 1 carton of cigarettes lasting approximately 3 weeks.   Labs independently reviewed: Obtained from the patient's [facility] portal on her phone: A1c 5.9,  serum creatinine 1.22, TC 164, TG 86, HDL 43, LDL 105  Past Medical History:  Diagnosis Date   Blood in stool    Chicken pox    Chronic back pain    Coronary artery disease    a. NSTEMI 4/18; b. LHC 4/18: LM nl, ost LAD 100% with L-L & R-L collats, LCx nl, OM3 40%, RCA with mild irregs   GERD (gastroesophageal reflux disease)    Heart disease    HLD (hyperlipidemia)    HTN (hypertension)    Ischemic cardiomyopathy    a. echo 4/18: EF 40-45%, ant HK, Gr1DD, mild MR, mild LAE, nl RVSF   NSTEMI (non-ST elevated myocardial infarction) ([place]) 06/2016   Obesity     Past Surgical History:  Procedure Laterality Date   CARDIAC CATHETERIZATION     COLONOSCOPY WITH PROPOFOL N/A 05/13/2018   Procedure: COLONOSCOPY WITH PROPOFOL;  Surgeon: Lin Landsman, MD;  Location: [facility] ENDOSCOPY;  Service: Gastroenterology;  Laterality: N/A;   GIVENS CAPSULE STUDY N/A 10/08/2018   Procedure: GIVENS CAPSULE STUDY;  Surgeon: Lin Landsman, MD;  Location: [facility] ENDOSCOPY;  Service: Gastroenterology;  Laterality: N/A;   LEFT HEART CATH AND CORONARY ANGIOGRAPHY N/A 07/11/2016   Procedure: Left Heart Cath and Coronary Angiography;  Surgeon: Nelva Bush, MD;  Location: [facility];  Service: Cardiovascular;  Laterality: N/A;   TUBAL LIGATION      Current Medications: Current Meds  Medication Sig   ASPIRIN LOW DOSE 81 MG EC tablet TAKE 1 TABLET BY MOUTH  DAILY   buPROPion (WELLBUTRIN SR) 150 MG 12 hr tablet Take 1 tablet (150 mg total) by mouth 2 (two) times daily.   gabapentin (NEURONTIN) 100 MG capsule Take 100 mg in the am & 200 mg  in the pm   naproxen sodium (ANAPROX) 220 MG tablet Take 220 mg by mouth as needed.   [DISCONTINUED] carvedilol (COREG) 3.125 MG tablet TAKE 1 TABLET BY MOUTH  TWICE DAILY WITH MEALS   [DISCONTINUED] ezetimibe (ZETIA) 10 MG tablet TAKE 1 TABLET BY MOUTH  DAILY   [DISCONTINUED] hydrochlorothiazide (HYDRODIURIL) 25 MG tablet TAKE 1 TABLET(25 MG) BY MOUTH DAILY    [DISCONTINUED] lisinopril (ZESTRIL) 10 MG tablet Take 1 tablet (10 mg total) by mouth daily.   [DISCONTINUED] nitroGLYCERIN (NITROSTAT) 0.4 MG SL tablet DISSOLVE 1 TABLET ON THE  TONGUE EVERY 5 MINS AS  NEEDED FOR CHEST PAIN MAX 3 TABS IN 15 MINUTES, CALL  911 IF CHEST PAIN PERSISTS (Patient taking differently: DISSOLVE 1 TABLET ON THE  TONGUE EVERY 5 MINS AS  NEEDED FOR CHEST PAIN MAX 3 TABS IN 15 MINUTES, CALL  911 IF CHEST PAIN PERSISTS)   [DISCONTINUED] rosuvastatin (CRESTOR) 40 MG tablet TAKE 1 TABLET BY MOUTH  DAILY    Allergies:   Patient has no known allergies.   Social History   Socioeconomic History   Marital status: Divorced    Spouse name: Not on file   Number of children: Not on file   Years of education: Not  on file   Highest education level: Not on file  Occupational History   Not on file  Tobacco Use   Smoking status: Every Day    Packs/day: 0.50    Years: 38.00    Total pack years: 19.00    Types: Cigarettes   Smokeless tobacco: Never  Vaping Use   Vaping Use: Never used  Substance and Sexual Activity   Alcohol use: Not Currently    Comment: ocassionally   Drug use: Never   Sexual activity: Not Currently  Other Topics Concern   Not on file  Social History Narrative   Not on file   Social Determinants of Health   Financial Resource Strain: Not on file  Food Insecurity: Not on file  Transportation Needs: Not on file  Physical Activity: Not on file  Stress: Not on file  Social Connections: Not on file     Family History:  The patient's family history includes Anuerysm in her brother; COPD in her mother; Heart attack in her father.  ROS:   12-point review of systems is negative unless otherwise noted in the HPI.   EKGs/Labs/Other Studies Reviewed:    Studies reviewed were summarized above. The additional studies were reviewed today:  2D echo 04/13/2021: 1. Left ventricular ejection fraction, by estimation, is 45 to 50%. The  left ventricle has  mildly decreased function. The left ventricle  demonstrates regional wall motion abnormalities (Hypokinesis of teh  anterior,anteroseptal and apical region). Left  ventricular diastolic parameters are consistent with Grade I diastolic  dysfunction (impaired relaxation).   2. Right ventricular systolic function is normal. The right ventricular  size is normal. Tricuspid regurgitation signal is inadequate for assessing  PA pressure.   3. Left atrial size was mildly dilated.   4. The mitral valve is normal in structure. Mild mitral valve  regurgitation. No evidence of mitral stenosis.   5. The aortic valve is normal in structure. Aortic valve regurgitation is  not visualized. No aortic stenosis is present.   6. The inferior vena cava is normal in size with greater than 50%  respiratory variability, suggesting right atrial pressure of 3 mmHg.   7. Challenging images, definity used.   Comparison(s): LVEF 40-45%, Apex/Anterior Hypokinesis. __________  LHC 06/2016: Conclusions: Significant single-vessel coronary artery disease with subacute vs. chronic total occlusion of ostial LAD. Distal vessel fills by left-to-left and right-to-left collaterals. Mild to moderate, non-obstructive disease involving the LCx and RCA. Upper normal left ventricular filling pressure. Moderate to severely reduced LV contraction with mid/apical anterior, apical, and apical inferior akinesis. LVEF 30-35%.   Recommendations: Medical therapy including aspirin and clopidogrel for 12 months. Low-dose beta-blocker, to be uptitrated as heart rate and blood pressure allow. Aggressive secondary prevention, including high-intensity statin therapy and smoking cessation. Add ACEI/ARB as blood pressure allows prior to discharge. Follow-up echocardiogram with particular attention to evaluate for LV apical thrombus. __________   Echo 06/2016: - Procedure narrative: Transthoracic echocardiography. The study    was technically  difficult.  - Left ventricle: The cavity size was normal. Systolic function was    mildly to moderately reduced. The estimated ejection fraction was    in the range of 40% to 45%. Hypokinesis of the anterior    myocardium. Hypokinesis of the apical myocardium. Hypokinesis of    the anteroseptal myocardium. Doppler parameters are consistent    with abnormal left ventricular relaxation (grade 1 diastolic    dysfunction).  - Mitral valve: There was mild regurgitation.  - Left  atrium: The atrium was mildly dilated.  - Right ventricle: Systolic function was normal.  - Pulmonary arteries: Systolic pressure was within the normal    range.    EKG:  EKG is ordered today.  The EKG ordered today demonstrates NSR, 92 bpm, poor R wave progression along the precordial leads, nonspecific ST-T changes  Recent Labs: 01/17/2021: ALT 13; BUN 14; Creatinine, Ser 0.90; Potassium 4.5; Sodium 138  Recent Lipid Panel    Component Value Date/Time   CHOL 192 01/17/2021 1039   TRIG 123 01/17/2021 1039   HDL 43 01/17/2021 1039   CHOLHDL 4.5 (H) 01/17/2021 1039   CHOLHDL 4.6 07/11/2016 0459   VLDL 10 07/11/2016 0459   LDLCALC 127 (H) 01/17/2021 1039   LDLDIRECT 122 (H) 01/17/2021 1039    PHYSICAL EXAM:    VS:  BP (!) 118/58 (BP Location: Left Arm, Patient Position: Sitting, Cuff Size: Large)   Pulse 92   Ht 5' 9"  (1.753 m)   Wt 229 lb 9.6 oz (104.1 kg)   SpO2 96%   BMI 33.91 kg/m   BMI: Body mass index is 33.91 kg/m.  Physical Exam Vitals reviewed.  Constitutional:      Appearance: She is well-developed.  HENT:     Head: Normocephalic and atraumatic.  Eyes:     General:        Right eye: No discharge.        Left eye: No discharge.  Neck:     Vascular: No JVD.  Cardiovascular:     Rate and Rhythm: Normal rate and regular rhythm.     Pulses:          Dorsalis pedis pulses are 2+ on the right side and 2+ on the left side.       Posterior tibial pulses are 2+ on the right side and 2+ on  the left side.     Heart sounds: Normal heart sounds, S1 normal and S2 normal. Heart sounds not distant. No midsystolic click and no opening snap. No murmur heard.    No friction rub.  Pulmonary:     Effort: Pulmonary effort is normal. No respiratory distress.     Breath sounds: Normal breath sounds. No decreased breath sounds, wheezing or rales.  Chest:     Chest wall: No tenderness.  Abdominal:     General: There is no distension.  Musculoskeletal:     Cervical back: Normal range of motion.     Right lower leg: No edema.     Left lower leg: No edema.  Skin:    General: Skin is warm and dry.     Nails: There is no clubbing.  Neurological:     Mental Status: She is alert and oriented to person, place, and time.  Psychiatric:        Speech: Speech normal.        Behavior: Behavior normal.        Thought Content: Thought content normal.        Judgment: Judgment normal.     Wt Readings from Last 3 Encounters:  11/18/21 229 lb 9.6 oz (104.1 kg)  05/05/21 240 lb 6 oz (109 kg)  02/28/21 229 lb 6 oz (104 kg)     ASSESSMENT & PLAN:   CAD involving the native coronary arteries without angina: She continues to do well and is without symptoms of angina or decompensation.  Continue current medical therapy including aspirin, carvedilol, rosuvastatin, ezetimibe, and lisinopril.  Aggressive resector modification  including complete smoking cessation is recommended.  No indication for further ischemic testing at this time.  HFrEF secondary to ICM: She appears euvolemic and well compensated with NYHA class II symptoms.  Her weight is down 11 pounds when compared to last visit.  Recent echo from 02/2021 demonstrated continued improvement in her LV systolic function with an EF of 50%.  Not requiring a standing loop diuretic.  She remains on carvedilol and lisinopril.  She has preferred to minimize pharmacotherapy.  CHF education.  HTN: Blood pressure is well controlled in the office today.   Continue medical therapy as outlined above.  HLD: LDL 105 with goal being less than 70.  She reports adherence to rosuvastatin 40 mg and ezetimibe 10 mg.  She would like to continue to work on heart healthy diet and regular exercise with a follow-up fasting lipid panel in 6 months.  At that time, if LDL remains above goal she agrees to referral to the lipid clinic for consideration of PCSK9 inhibitor.  Tobacco use: She remains on Wellbutrin.  Complete cessation is encouraged.    Disposition: F/u with Dr. Saunders Revel or an APP in 6 months.   Medication Adjustments/Labs and Tests Ordered: Current medicines are reviewed at length with the patient today.  Concerns regarding medicines are outlined above. Medication changes, Labs and Tests ordered today are summarized above and listed in the Patient Instructions accessible in Encounters.   Signed, Christell Faith, PA-C 11/18/2021 4:29 PM     [facility] [address] [place], [place] [address] [phone]

## 2021-11-17 ENCOUNTER — Telehealth: Payer: Self-pay | Admitting: Physician Assistant

## 2021-11-17 NOTE — Telephone Encounter (Signed)
Patient stated that she will bring her lab results with her as I confirmed her appointment.

## 2021-11-18 ENCOUNTER — Encounter: Payer: Self-pay | Admitting: Physician Assistant

## 2021-11-18 ENCOUNTER — Ambulatory Visit: Payer: Managed Care, Other (non HMO) | Attending: Physician Assistant | Admitting: Physician Assistant

## 2021-11-18 VITALS — BP 118/58 | HR 92 | Ht 69.0 in | Wt 229.6 lb

## 2021-11-18 DIAGNOSIS — E785 Hyperlipidemia, unspecified: Secondary | ICD-10-CM

## 2021-11-18 DIAGNOSIS — I255 Ischemic cardiomyopathy: Secondary | ICD-10-CM

## 2021-11-18 DIAGNOSIS — I2510 Atherosclerotic heart disease of native coronary artery without angina pectoris: Secondary | ICD-10-CM

## 2021-11-18 DIAGNOSIS — Z720 Tobacco use: Secondary | ICD-10-CM

## 2021-11-18 DIAGNOSIS — I10 Essential (primary) hypertension: Secondary | ICD-10-CM

## 2021-11-18 DIAGNOSIS — I5022 Chronic systolic (congestive) heart failure: Secondary | ICD-10-CM

## 2021-11-18 MED ORDER — CARVEDILOL 3.125 MG PO TABS: 3.1250 mg | ORAL_TABLET | Freq: Two times a day (BID) | ORAL | 3 refills | Status: DC

## 2021-11-18 MED ORDER — NITROGLYCERIN 0.4 MG SL SUBL: SUBLINGUAL_TABLET | SUBLINGUAL | 5 refills | Status: AC

## 2021-11-18 MED ORDER — EZETIMIBE 10 MG PO TABS: 10.0000 mg | ORAL_TABLET | Freq: Every day | ORAL | 3 refills | Status: DC

## 2021-11-18 MED ORDER — HYDROCHLOROTHIAZIDE 25 MG PO TABS: 25.0000 mg | ORAL_TABLET | Freq: Every day | ORAL | 3 refills | Status: DC

## 2021-11-18 MED ORDER — LISINOPRIL 10 MG PO TABS: 10.0000 mg | ORAL_TABLET | Freq: Every day | ORAL | 3 refills | Status: DC

## 2021-11-18 MED ORDER — ROSUVASTATIN CALCIUM 40 MG PO TABS: 40.0000 mg | ORAL_TABLET | Freq: Every day | ORAL | 3 refills | Status: DC

## 2021-11-18 NOTE — Patient Instructions (Signed)
Medication Instructions:  Your physician recommends that you continue on your current medications as directed. Please refer to the Current Medication list given to you today.  *If you need a refill on your cardiac medications before your next appointment, please call your pharmacy*   Lab Work: None If you have labs (blood work) drawn today and your tests are completely normal, you will receive your results only by: [facility] (if you have MyChart) OR A paper copy in the mail If you have any lab test that is abnormal or we need to change your treatment, we will call you to review the results.  Follow-Up: At [facility], you and your health needs are our priority.  As part of our continuing mission to provide you with exceptional heart care, we have created designated Provider Care Teams.  These Care Teams include your primary Cardiologist (physician) and Advanced Practice Providers (APPs -  Physician Assistants and Nurse Practitioners) who all work together to provide you with the care you need, when you need it.  Your next appointment:   6 month(s)  The format for your next appointment:   In Person  Provider:   You may see Nelva Bush, MD or one of the following Advanced Practice Providers on your designated Care Team:   Murray Hodgkins, NP Christell Faith, PA-C Cadence Kathlen Mody, PA-C Gerrie Nordmann, NP  Important Information About Sugar

## 2021-11-23 ENCOUNTER — Other Ambulatory Visit: Payer: Self-pay | Admitting: *Deleted

## 2021-11-23 MED ORDER — BUPROPION HCL ER (SR) 150 MG PO TB12: 150.0000 mg | ORAL_TABLET | Freq: Two times a day (BID) | ORAL | 3 refills | Status: DC

## 2022-06-05 NOTE — Progress Notes (Signed)
Cardiology Office Note    Date:  06/09/2022   ID:  Deanna Moran, Deanna Moran, Deanna Moran, MRN [id number]  PCP:  Jodelle Green, [name]  Cardiologist:  Nelva Bush, MD  Electrophysiologist:  None   Chief Complaint: Follow up  History of Present Illness:   Deanna Moran is a 57 y.o. female with history of CAD with NSTEMI in 06/2016 that was medically managed, HFrEF secondary to ICM, HTN, HLD, Crohn's disease, peripheral neuropathy, and tobacco use who presents for follow-up of her CAD and cardiomyopathy.   She was admitted to the hospital in 06/2016 with an NSTEMI with troponin peaking at 10.78.  LHC at that time demonstrated significant one-vessel CAD with subacute versus CTO of the ostial LAD with left to left and right to left collaterals.  There was moderate nonobstructive stenosis involving the LCx and RCA.  LVEF was 30 to 35% with anterior and apical wall motion abnormality noted.  She was medically managed.  Echo during that admission showed an EF of 40 to 45%, anterior hypokinesis, grade 1 diastolic dysfunction, mild mitral regurgitation, mild left atrial enlargement, and normal RV systolic function and ventricular cavity size.  She has not required ischemic evaluation since.   She was seen in the office in 10/2020 and was noted to have self discontinued all medications approximately 6 months prior to that visit, indicating she was tired of taking them.  She indicated she planned on resuming her medications following her visit.  Despite being off medications, her BP remained well controlled in the [id number] to [id number] systolic.  She did continue to smoke.  She underwent repeat echo, on medical therapy, on 04/13/2021 which demonstrated an improved EF of 45 to 50%, hypokinesis of the anterior, anteroseptal, and apical region, grade 1 diastolic dysfunction, normal RV systolic function and ventricular cavity size, mildly dilated left atrium, mild mitral regurgitation, and an estimated right atrial pressure of  3 mmHg.  She was last seen in the office in 11/2021 and was without symptoms of angina or cardiac decompensation.  She has significantly improved her diet.  She did continue to smoke, and was working on tapering.  She comes in doing well from a cardiac perspective and is without symptoms of angina or cardiac decompensation.  Her weight is down another 15 pounds when compared to her last visit.  She indicates that she continues to work on consuming a healthier diet.  Blood pressure at home has been well-controlled in the 1 teens to Q000111Q systolic.  No dizziness, presyncope, or syncope.  No falls or symptoms concerning for bleeding.  She does continue to smoke with 1 pack of cigarettes lasting her approximately 4 days.  She requests a refill of Wellbutrin.  She is under increased financial stress.  She does not have any acute cardiac concerns at this time.   Labs independently reviewed: Obtained from the patient's [facility] portal on her phone: A1c 5.9, serum creatinine 1.22, TC 164, TG 86, HDL 43, LDL 105    Past Medical History:  Diagnosis Date   Blood in stool    Chicken pox    Chronic back pain    Coronary artery disease    a. NSTEMI 4/18; b. LHC 4/18: LM nl, ost LAD 100% with L-L & R-L collats, LCx nl, OM3 40%, RCA with mild irregs   GERD (gastroesophageal reflux disease)    Heart disease    HLD (hyperlipidemia)    HTN (hypertension)    Ischemic cardiomyopathy    a. echo  4/18: EF 40-45%, ant HK, Gr1DD, mild MR, mild LAE, nl RVSF   NSTEMI (non-ST elevated myocardial infarction) ([place]) 06/2016   Obesity     Past Surgical History:  Procedure Laterality Date   CARDIAC CATHETERIZATION     COLONOSCOPY WITH PROPOFOL N/A 05/13/2018   Procedure: COLONOSCOPY WITH PROPOFOL;  Surgeon: Lin Landsman, MD;  Location: [facility] ENDOSCOPY;  Service: Gastroenterology;  Laterality: N/A;   GIVENS CAPSULE STUDY N/A 10/08/2018   Procedure: GIVENS CAPSULE STUDY;  Surgeon: Lin Landsman, MD;  Location:  [facility] ENDOSCOPY;  Service: Gastroenterology;  Laterality: N/A;   LEFT HEART CATH AND CORONARY ANGIOGRAPHY N/A 07/11/2016   Procedure: Left Heart Cath and Coronary Angiography;  Surgeon: Nelva Bush, MD;  Location: [place] CV LAB;  Service: Cardiovascular;  Laterality: N/A;   TUBAL LIGATION      Current Medications: Current Meds  Medication Sig   ASPIRIN LOW DOSE 81 MG EC tablet TAKE 1 TABLET BY MOUTH  DAILY   carvedilol (COREG) 3.125 MG tablet Take 1 tablet (3.125 mg total) by mouth 2 (two) times daily with a meal.   ezetimibe (ZETIA) 10 MG tablet Take 1 tablet (10 mg total) by mouth daily.   hydrochlorothiazide (HYDRODIURIL) 25 MG tablet Take 1 tablet (25 mg total) by mouth daily.   lisinopril (ZESTRIL) 10 MG tablet Take 1 tablet (10 mg total) by mouth daily.   naproxen sodium (ANAPROX) 220 MG tablet Take 220 mg by mouth as needed.   nitroGLYCERIN (NITROSTAT) 0.4 MG SL tablet DISSOLVE 1 TABLET UNDER THE TONGUE EVERY 5 MINS AS  NEEDED FOR CHEST PAIN MAX 3 TABS IN 15 MINUTES, CALL  911 IF CHEST PAIN PERSISTS   rosuvastatin (CRESTOR) 40 MG tablet Take 1 tablet (40 mg total) by mouth daily.   [DISCONTINUED] buPROPion (WELLBUTRIN SR) 150 MG 12 hr tablet Take 1 tablet (150 mg total) by mouth 2 (two) times daily.    Allergies:   Patient has no known allergies.   Social History   Socioeconomic History   Marital status: Divorced    Spouse name: Not on file   Number of children: Not on file   Years of education: Not on file   Highest education level: Not on file  Occupational History   Not on file  Tobacco Use   Smoking status: Every Day    Packs/day: 0.50    Years: 38.00    Additional pack years: 0.00    Total pack years: 19.00    Types: Cigarettes   Smokeless tobacco: Never  Vaping Use   Vaping Use: Never used  Substance and Sexual Activity   Alcohol use: Not Currently    Comment: ocassionally   Drug use: Never   Sexual activity: Not Currently  Other Topics Concern    Not on file  Social History Narrative   Not on file   Social Determinants of Health   Financial Resource Strain: Not on file  Food Insecurity: Not on file  Transportation Needs: Not on file  Physical Activity: Not on file  Stress: Not on file  Social Connections: Not on file     Family History:  The patient's family history includes Anuerysm in her brother; COPD in her mother; Heart attack in her father.  ROS:   12-point review of systems is negative unless otherwise noted in the HPI.   EKGs/Labs/Other Studies Reviewed:    Studies reviewed were summarized above. The additional studies were reviewed today:  2D echo 04/13/2021: 1. Left ventricular  ejection fraction, by estimation, is 45 to 50%. The  left ventricle has mildly decreased function. The left ventricle  demonstrates regional wall motion abnormalities (Hypokinesis of teh  anterior,anteroseptal and apical region). Left  ventricular diastolic parameters are consistent with Grade I diastolic  dysfunction (impaired relaxation).   2. Right ventricular systolic function is normal. The right ventricular  size is normal. Tricuspid regurgitation signal is inadequate for assessing  PA pressure.   3. Left atrial size was mildly dilated.   4. The mitral valve is normal in structure. Mild mitral valve  regurgitation. No evidence of mitral stenosis.   5. The aortic valve is normal in structure. Aortic valve regurgitation is  not visualized. No aortic stenosis is present.   6. The inferior vena cava is normal in size with greater than 50%  respiratory variability, suggesting right atrial pressure of 3 mmHg.   7. Challenging images, definity used.   Comparison(s): LVEF 40-45%, Apex/Anterior Hypokinesis. __________   LHC 06/2016: Conclusions: Significant single-vessel coronary artery disease with subacute vs. chronic total occlusion of ostial LAD. Distal vessel fills by left-to-left and right-to-left collaterals. Mild to  moderate, non-obstructive disease involving the LCx and RCA. Upper normal left ventricular filling pressure. Moderate to severely reduced LV contraction with mid/apical anterior, apical, and apical inferior akinesis. LVEF 30-35%.   Recommendations: Medical therapy including aspirin and clopidogrel for 12 months. Low-dose beta-blocker, to be uptitrated as heart rate and blood pressure allow. Aggressive secondary prevention, including high-intensity statin therapy and smoking cessation. Add ACEI/ARB as blood pressure allows prior to discharge. Follow-up echocardiogram with particular attention to evaluate for LV apical thrombus. __________   Echo 06/2016: - Procedure narrative: Transthoracic echocardiography. The study    was technically difficult.  - Left ventricle: The cavity size was normal. Systolic function was    mildly to moderately reduced. The estimated ejection fraction was    in the range of 40% to 45%. Hypokinesis of the anterior    myocardium. Hypokinesis of the apical myocardium. Hypokinesis of    the anteroseptal myocardium. Doppler parameters are consistent    with abnormal left ventricular relaxation (grade 1 diastolic    dysfunction).  - Mitral valve: There was mild regurgitation.  - Left atrium: The atrium was mildly dilated.  - Right ventricle: Systolic function was normal.  - Pulmonary arteries: Systolic pressure was within the normal    range.    EKG:  EKG is ordered today.  The EKG ordered today demonstrates NSR, 60 bpm, incomplete RBBB, consistent with prior tracing  Recent Labs: No results found for requested labs within last 365 days.  Recent Lipid Panel    Component Value Date/Time   CHOL 192 01/17/2021 1039   TRIG 123 01/17/2021 1039   HDL 43 01/17/2021 1039   CHOLHDL 4.5 (H) 01/17/2021 1039   CHOLHDL 4.6 07/11/2016 0459   VLDL 10 07/11/2016 0459   LDLCALC 127 (H) 01/17/2021 1039   LDLDIRECT 122 (H) 01/17/2021 1039    PHYSICAL EXAM:    VS:  BP  107/62   Pulse 68   Ht 5\' 9"  (1.753 m)   Wt 214 lb 3.2 oz (97.2 kg)   SpO2 97%   BMI 31.63 kg/m   BMI: Body mass index is 31.63 kg/m.  Physical Exam Vitals reviewed.  Constitutional:      Appearance: She is well-developed.  HENT:     Head: Normocephalic and atraumatic.  Eyes:     General:        Right  eye: No discharge.        Left eye: No discharge.  Neck:     Vascular: No JVD.  Cardiovascular:     Rate and Rhythm: Normal rate and regular rhythm.     Pulses:          Dorsalis pedis pulses are 2+ on the right side and 2+ on the left side.       Posterior tibial pulses are 2+ on the right side and 2+ on the left side.     Heart sounds: Normal heart sounds, S1 normal and S2 normal. Heart sounds not distant. No midsystolic click and no opening snap. No murmur heard.    No friction rub.  Pulmonary:     Effort: Pulmonary effort is normal. No respiratory distress.     Breath sounds: Normal breath sounds. No decreased breath sounds, wheezing or rales.  Chest:     Chest wall: No tenderness.  Abdominal:     General: There is no distension.  Musculoskeletal:     Cervical back: Normal range of motion.     Right lower leg: No edema.     Left lower leg: No edema.  Skin:    General: Skin is warm and dry.     Nails: There is no clubbing.  Neurological:     Mental Status: She is alert and oriented to person, place, and time.  Psychiatric:        Speech: Speech normal.        Behavior: Behavior normal.        Thought Content: Thought content normal.        Judgment: Judgment normal.     Wt Readings from Last 3 Encounters:  06/09/22 214 lb 3.2 oz (97.2 kg)  11/18/21 229 lb 9.6 oz (104.1 kg)  05/05/21 240 lb 6 oz (109 kg)     ASSESSMENT & PLAN:   CAD involving the native coronary arteries without angina: She continues to do well and has without symptoms of angina or cardiac decompensation.  Continue aggressive risk factor modification and secondary prevention including  complete smoking cessation, aspirin, carvedilol, rosuvastatin, ezetimibe, and lisinopril.  No indication for further ischemic testing at this time.  HFrEF secondary to ICM: She is euvolemic and well compensated with NYHA class II symptoms.  Not requiring a standing loop diuretic.  She remains on carvedilol and lisinopril.  She has preferred to minimize pharmacotherapy.  Relative hypotension precludes escalation of GDMT at this time.  CHF education.  HTN: Blood pressure is well-controlled in the office today.  Continue current medical therapy.  HLD: LDL 105 with goal being less than 70.  She remains on ezetimibe and rosuvastatin.  She will obtain fasting labs through her work.  If LDL remains above goal we may need to consider PCSK9 inhibitor if not precluded by financial constraints.  Tobacco use: Refill Wellbutrin.  Complete cessation is encouraged.  Weight loss: It is unclear if all of this is intentional versus unintentional.  I did discuss healthy weight loss with her.  It may be beneficial for her to discuss her weight loss with her PCP to ensure there is not an outside driving force.   Disposition: F/u with Dr. Saunders Revel or an APP in 6 months.   Medication Adjustments/Labs and Tests Ordered: Current medicines are reviewed at length with the patient today.  Concerns regarding medicines are outlined above. Medication changes, Labs and Tests ordered today are summarized above and listed in the Patient Instructions accessible  in Encounters.   Signed, Christell Faith, PA-C 06/09/2022 3:36 PM     [facility] [address] [place], [place] [address] [phone]

## 2022-06-09 ENCOUNTER — Encounter: Payer: Self-pay | Admitting: Physician Assistant

## 2022-06-09 ENCOUNTER — Ambulatory Visit: Payer: Managed Care, Other (non HMO) | Attending: Physician Assistant | Admitting: Physician Assistant

## 2022-06-09 VITALS — BP 107/62 | HR 68 | Ht 69.0 in | Wt 214.2 lb

## 2022-06-09 DIAGNOSIS — E785 Hyperlipidemia, unspecified: Secondary | ICD-10-CM

## 2022-06-09 DIAGNOSIS — I255 Ischemic cardiomyopathy: Secondary | ICD-10-CM | POA: Diagnosis not present

## 2022-06-09 DIAGNOSIS — I2510 Atherosclerotic heart disease of native coronary artery without angina pectoris: Secondary | ICD-10-CM | POA: Diagnosis not present

## 2022-06-09 DIAGNOSIS — I5022 Chronic systolic (congestive) heart failure: Secondary | ICD-10-CM

## 2022-06-09 DIAGNOSIS — I10 Essential (primary) hypertension: Secondary | ICD-10-CM

## 2022-06-09 DIAGNOSIS — Z720 Tobacco use: Secondary | ICD-10-CM

## 2022-06-09 MED ORDER — BUPROPION HCL ER (SR) 150 MG PO TB12: 150.0000 mg | ORAL_TABLET | Freq: Two times a day (BID) | ORAL | 3 refills | Status: AC

## 2022-06-09 NOTE — Patient Instructions (Signed)
Medication Instructions:  No changes at this time.   *If you need a refill on your cardiac medications before your next appointment, please call your pharmacy*   Lab Work: CMET & Lipid panel. This is fasting so nothing to eat or drink after midnight except sip of water with your medications.   If you have labs (blood work) drawn today and your tests are completely normal, you will receive your results only by: [place] (if you have MyChart) OR A paper copy in the mail If you have any lab test that is abnormal or we need to change your treatment, we will call you to review the results.   Testing/Procedures: None   Follow-Up: At [facility], you and your health needs are our priority.  As part of our continuing mission to provide you with exceptional heart care, we have created designated Provider Care Teams.  These Care Teams include your primary Cardiologist (physician) and Advanced Practice Providers (APPs -  Physician Assistants and Nurse Practitioners) who all work together to provide you with the care you need, when you need it.   Your next appointment:   August  Provider:   Christell Faith, PA-C

## 2022-06-17 LAB — LIPID PANEL
Chol/HDL Ratio: 2.3 ratio (ref 0.0–4.4)
Cholesterol, Total: 103 mg/dL (ref 100–199)
HDL: 44 mg/dL (ref >39)
LDL Chol Calc (NIH): 45 mg/dL (ref 0–99)
Triglycerides: 64 mg/dL (ref 0–149)
VLDL Cholesterol Cal: 14 mg/dL (ref 5–40)

## 2022-06-17 LAB — COMPREHENSIVE METABOLIC PANEL
ALT: 20 IU/L (ref 0–32)
AST: 13 IU/L (ref 0–40)
Albumin/Globulin Ratio: 2 (ref 1.2–2.2)
Albumin: 4.5 g/dL (ref 3.8–4.9)
Alkaline Phosphatase: 81 IU/L (ref 44–121)
BUN/Creatinine Ratio: 14 (ref 9–23)
BUN: 20 mg/dL (ref 6–24)
Bilirubin Total: 0.7 mg/dL (ref 0.0–1.2)
CO2: 28 mmol/L (ref 20–29)
Calcium: 9.6 mg/dL (ref 8.7–10.2)
Chloride: 95 mmol/L — ABNORMAL LOW (ref 96–106)
Creatinine, Ser: 1.42 mg/dL — ABNORMAL HIGH (ref 0.57–1.00)
Globulin, Total: 2.2 g/dL (ref 1.5–4.5)
Glucose: 111 mg/dL — ABNORMAL HIGH (ref 70–99)
Potassium: 4 mmol/L (ref 3.5–5.2)
Sodium: 137 mmol/L (ref 134–144)
Total Protein: 6.7 g/dL (ref 6.0–8.5)
eGFR: 43 mL/min/{1.73_m2} — ABNORMAL LOW (ref >59)

## 2022-06-20 ENCOUNTER — Other Ambulatory Visit: Payer: Self-pay | Admitting: *Deleted

## 2022-06-20 DIAGNOSIS — I5022 Chronic systolic (congestive) heart failure: Secondary | ICD-10-CM

## 2022-06-20 DIAGNOSIS — I2510 Atherosclerotic heart disease of native coronary artery without angina pectoris: Secondary | ICD-10-CM

## 2022-06-20 MED ORDER — HYDROCHLOROTHIAZIDE 25 MG PO TABS: 12.5000 mg | ORAL_TABLET | Freq: Every day | ORAL | 3 refills | Status: AC

## 2022-06-26 ENCOUNTER — Other Ambulatory Visit: Payer: Self-pay | Admitting: *Deleted

## 2022-07-06 ENCOUNTER — Other Ambulatory Visit
Admission: RE | Admit: 2022-07-06 | Discharge: 2022-07-06 | Disposition: A | Discharge status: Home / Self Care | Payer: Managed Care, Other (non HMO) | Attending: Physician Assistant | Admitting: Physician Assistant

## 2022-07-06 DIAGNOSIS — I2510 Atherosclerotic heart disease of native coronary artery without angina pectoris: Secondary | ICD-10-CM | POA: Diagnosis not present

## 2022-07-06 DIAGNOSIS — I5022 Chronic systolic (congestive) heart failure: Secondary | ICD-10-CM | POA: Diagnosis present

## 2022-07-06 LAB — BASIC METABOLIC PANEL
Anion gap: 8 (ref 5–15)
BUN: 18 mg/dL (ref 6–20)
CO2: 28 mmol/L (ref 22–32)
Calcium: 8.8 mg/dL — ABNORMAL LOW (ref 8.9–10.3)
Chloride: 104 mmol/L (ref 98–111)
Creatinine, Ser: 1.08 mg/dL — ABNORMAL HIGH (ref 0.44–1.00)
GFR, Estimated: 60 mL/min — ABNORMAL LOW (ref >60)
Glucose, Bld: 78 mg/dL (ref 70–99)
Potassium: 3.6 mmol/L (ref 3.5–5.1)
Sodium: 140 mmol/L (ref 135–145)

## 2022-11-14 NOTE — Progress Notes (Signed)
Cardiology Office Note    Date:  11/17/2022   ID:  Deanna Moran Jun 02, 1965, MRN [id number]  PCP:  Tracey Harries, [name]  Cardiologist:  Yvonne Kendall, MD  Electrophysiologist:  None   Chief Complaint: Follow up  History of Present Illness:   Deanna Moran is a 58 y.o. female with history of CAD with NSTEMI in 06/2016 that was medically managed, HFrEF secondary to ICM, HTN, HLD, Crohn's disease, peripheral neuropathy, and tobacco use who presents for follow-up of her CAD and cardiomyopathy.   She was admitted to the hospital in 06/2016 with an NSTEMI with troponin peaking at 10.78.  LHC at that time demonstrated significant one-vessel CAD with subacute versus CTO of the ostial LAD with left to left and right to left collaterals.  There was moderate nonobstructive stenosis involving the LCx and RCA.  LVEF was 30 to 35% with anterior and apical wall motion abnormality noted.  She was medically managed.  Echo during that admission showed an EF of 40 to 45%, anterior hypokinesis, grade 1 diastolic dysfunction, mild mitral regurgitation, mild left atrial enlargement, and normal RV systolic function and ventricular cavity size.  She has not required ischemic evaluation since.   She was seen in the office in 10/2020 and was noted to have self discontinued all medications approximately 6 months prior to that visit, indicating she was tired of taking them.  She indicated she planned on resuming her medications following her visit.  Despite being off medications, her BP remained well controlled in the 120s to 130s systolic.  She did continue to smoke.  She underwent repeat echo, on medical therapy, on 04/13/2021 which demonstrated an improved EF of 45 to 50%, hypokinesis of the anterior, anteroseptal, and apical region, grade 1 diastolic dysfunction, normal RV systolic function and ventricular cavity size, mildly dilated left atrium, mild mitral regurgitation, and an estimated right atrial pressure of  3 mmHg.  She was last seen in the office in 05/2022 and remained without symptoms of angina or cardiac decompensation.  Her weight continued to downtrend with lifestyle modification.  She was smoking about a quarter of a pack per day.  She comes in doing well from a cardiac perspective and is without symptoms of angina or cardiac decompensation.  Her weight is down another 9 pounds today when compared to revisit in 05/2022, which is intentional with improved diet.  She has been under increased stress recently following her recent move to the [place] area, which will help some with her financial stress.  She continues to smoke approximately quarter pack of cigarettes per day.  She indicates Wellbutrin is helping.  No dizziness, presyncope, or syncope.  No lower extremity swelling or progressive orthopnea.   Labs independently reviewed: 10/2022 -TC 120, TG 99, HDL 38, LDL 63, A1c 5.7 06/2022 - potassium 3.6, BUN 18, serum creatinine 1.08 05/2022 - albumin 4.5, AST/ALT normal  Past Medical History:  Diagnosis Date   Blood in stool    Chicken pox    Chronic back pain    Coronary artery disease    a. NSTEMI 4/18; b. LHC 4/18: LM nl, ost LAD 100% with L-L & R-L collats, LCx nl, OM3 40%, RCA with mild irregs   GERD (gastroesophageal reflux disease)    Heart disease    HLD (hyperlipidemia)    HTN (hypertension)    Ischemic cardiomyopathy    a. echo 4/18: EF 40-45%, ant HK, Gr1DD, mild MR, mild LAE, nl RVSF   NSTEMI (non-ST  elevated myocardial infarction) (HCC) 06/2016   Obesity     Past Surgical History:  Procedure Laterality Date   CARDIAC CATHETERIZATION     COLONOSCOPY WITH PROPOFOL N/A 05/13/2018   Procedure: COLONOSCOPY WITH PROPOFOL;  Surgeon: Toney Reil, MD;  Location: [facility] ENDOSCOPY;  Service: Gastroenterology;  Laterality: N/A;   GIVENS CAPSULE STUDY N/A 10/08/2018   Procedure: GIVENS CAPSULE STUDY;  Surgeon: Toney Reil, MD;  Location: [facility] ENDOSCOPY;  Service:  Gastroenterology;  Laterality: N/A;   LEFT HEART CATH AND CORONARY ANGIOGRAPHY N/A 07/11/2016   Procedure: Left Heart Cath and Coronary Angiography;  Surgeon: Yvonne Kendall, MD;  Location: [facility] INVASIVE CV LAB;  Service: Cardiovascular;  Laterality: N/A;   TUBAL LIGATION      Current Medications: Current Meds  Medication Sig   ASPIRIN LOW DOSE 81 MG EC tablet TAKE 1 TABLET BY MOUTH  DAILY   buPROPion (WELLBUTRIN SR) 150 MG 12 hr tablet Take 1 tablet (150 mg total) by mouth 2 (two) times daily.   carvedilol (COREG) 3.125 MG tablet Take 1 tablet (3.125 mg total) by mouth 2 (two) times daily with a meal.   ezetimibe (ZETIA) 10 MG tablet Take 1 tablet (10 mg total) by mouth daily.   hydrochlorothiazide (HYDRODIURIL) 25 MG tablet Take 0.5 tablets (12.5 mg total) by mouth daily. Take one half tablet once daily   lisinopril (ZESTRIL) 10 MG tablet Take 1 tablet (10 mg total) by mouth daily.   naproxen sodium (ANAPROX) 220 MG tablet Take 220 mg by mouth as needed.   nitroGLYCERIN (NITROSTAT) 0.4 MG SL tablet DISSOLVE 1 TABLET UNDER THE TONGUE EVERY 5 MINS AS  NEEDED FOR CHEST PAIN MAX 3 TABS IN 15 MINUTES, CALL  911 IF CHEST PAIN PERSISTS   rosuvastatin (CRESTOR) 40 MG tablet Take 1 tablet (40 mg total) by mouth daily.    Allergies:   Patient has no known allergies.   Social History   Socioeconomic History   Marital status: Divorced    Spouse name: Not on file   Number of children: Not on file   Years of education: Not on file   Highest education level: Not on file  Occupational History   Not on file  Tobacco Use   Smoking status: Every Day    Current packs/day: 0.50    Average packs/day: 0.5 packs/day for 38.0 years (19.0 ttl pk-yrs)    Types: Cigarettes   Smokeless tobacco: Never  Vaping Use   Vaping status: Never Used  Substance and Sexual Activity   Alcohol use: Not Currently    Comment: ocassionally   Drug use: Never   Sexual activity: Not Currently  Other Topics Concern    Not on file  Social History Narrative   Not on file   Social Determinants of Health   Financial Resource Strain: Not on file  Food Insecurity: Not on file  Transportation Needs: Not on file  Physical Activity: Not on file  Stress: Not on file  Social Connections: Not on file     Family History:  The patient's family history includes Anuerysm in her brother; COPD in her mother; Heart attack in her father.  ROS:   12-point review of systems is negative unless otherwise noted in the HPI.   EKGs/Labs/Other Studies Reviewed:    Studies reviewed were summarized above. The additional studies were reviewed today:  2D echo 04/13/2021: 1. Left ventricular ejection fraction, by estimation, is 45 to 50%. The  left ventricle has mildly decreased function.  The left ventricle  demonstrates regional wall motion abnormalities (Hypokinesis of teh  anterior,anteroseptal and apical region). Left  ventricular diastolic parameters are consistent with Grade I diastolic  dysfunction (impaired relaxation).   2. Right ventricular systolic function is normal. The right ventricular  size is normal. Tricuspid regurgitation signal is inadequate for assessing  PA pressure.   3. Left atrial size was mildly dilated.   4. The mitral valve is normal in structure. Mild mitral valve  regurgitation. No evidence of mitral stenosis.   5. The aortic valve is normal in structure. Aortic valve regurgitation is  not visualized. No aortic stenosis is present.   6. The inferior vena cava is normal in size with greater than 50%  respiratory variability, suggesting right atrial pressure of 3 mmHg.   7. Challenging images, definity used.   Comparison(s): LVEF 40-45%, Apex/Anterior Hypokinesis. __________   LHC 06/2016: Conclusions: Significant single-vessel coronary artery disease with subacute vs. chronic total occlusion of ostial LAD. Distal vessel fills by left-to-left and right-to-left collaterals. Mild to  moderate, non-obstructive disease involving the LCx and RCA. Upper normal left ventricular filling pressure. Moderate to severely reduced LV contraction with mid/apical anterior, apical, and apical inferior akinesis. LVEF 30-35%.   Recommendations: Medical therapy including aspirin and clopidogrel for 12 months. Low-dose beta-blocker, to be uptitrated as heart rate and blood pressure allow. Aggressive secondary prevention, including high-intensity statin therapy and smoking cessation. Add ACEI/ARB as blood pressure allows prior to discharge. Follow-up echocardiogram with particular attention to evaluate for LV apical thrombus. __________   Echo 06/2016: - Procedure narrative: Transthoracic echocardiography. The study    was technically difficult.  - Left ventricle: The cavity size was normal. Systolic function was    mildly to moderately reduced. The estimated ejection fraction was    in the range of 40% to 45%. Hypokinesis of the anterior    myocardium. Hypokinesis of the apical myocardium. Hypokinesis of    the anteroseptal myocardium. Doppler parameters are consistent    with abnormal left ventricular relaxation (grade 1 diastolic    dysfunction).  - Mitral valve: There was mild regurgitation.  - Left atrium: The atrium was mildly dilated.  - Right ventricle: Systolic function was normal.  - Pulmonary arteries: Systolic pressure was within the normal    range.    EKG:  EKG is not ordered today.    Recent Labs: 06/16/2022: ALT 20 07/06/2022: BUN 18; Creatinine, Ser 1.08; Potassium 3.6; Sodium 140  Recent Lipid Panel    Component Value Date/Time   CHOL 103 06/16/2022 1022   TRIG 64 06/16/2022 1022   HDL 44 06/16/2022 1022   CHOLHDL 2.3 06/16/2022 1022   CHOLHDL 4.6 07/11/2016 0459   VLDL 10 07/11/2016 0459   LDLCALC 45 06/16/2022 1022   LDLDIRECT 122 (H) 01/17/2021 1039    PHYSICAL EXAM:    VS:  BP (!) 110/58 (BP Location: Left Arm, Patient Position: Sitting, Cuff Size:  Normal)   Pulse 68   Ht 5' 7.3" (1.709 m)   Wt 205 lb 6.4 oz (93.2 kg)   SpO2 97%   BMI 31.88 kg/m   BMI: Body mass index is 31.88 kg/m.  Physical Exam Vitals reviewed.  Constitutional:      Appearance: She is well-developed.  HENT:     Head: Normocephalic and atraumatic.  Eyes:     General:        Right eye: No discharge.        Left eye: No discharge.  Neck:  Vascular: No JVD.  Cardiovascular:     Rate and Rhythm: Normal rate and regular rhythm.     Heart sounds: Normal heart sounds, S1 normal and S2 normal. Heart sounds not distant. No midsystolic click and no opening snap. No murmur heard.    No friction rub.  Pulmonary:     Effort: Pulmonary effort is normal. No respiratory distress.     Breath sounds: Normal breath sounds. No decreased breath sounds, wheezing or rales.  Chest:     Chest wall: No tenderness.  Abdominal:     General: There is no distension.  Musculoskeletal:     Cervical back: Normal range of motion.  Skin:    General: Skin is warm and dry.     Nails: There is no clubbing.  Neurological:     Mental Status: She is alert and oriented to person, place, and time.  Psychiatric:        Speech: Speech normal.        Behavior: Behavior normal.        Thought Content: Thought content normal.        Judgment: Judgment normal.     Wt Readings from Last 3 Encounters:  11/17/22 205 lb 6.4 oz (93.2 kg)  06/09/22 214 lb 3.2 oz (97.2 kg)  11/18/21 229 lb 9.6 oz (104.1 kg)     ASSESSMENT & PLAN:   CAD involving the native coronary arteries without angina: She continues to do well and is without symptoms of angina or cardiac decompensation.  Continue aggressive risk factor modification and secondary prevention including complete smoking cessation (see below), aspirin, carvedilol, rosuvastatin, ezetimibe, and lisinopril.  No indication at this time for further ischemic testing.  HFrEF secondary to ICM: Euvolemic and well compensated with NYHA class I-II  symptoms.  Not requiring a standing loop diuretic.  She remains on carvedilol and lisinopril.  She has preferred to minimize pharmacotherapy.  Given this, and in the setting of relative hypotension, defer escalation of GDMT at this time.  CHF education.  HTN: Blood pressure is well-controlled in the office today.  Continue current medical therapy.  HLD: LDL 63 in 10/2022.  She remains on rosuvastatin and ezetimibe.  Tobacco use: Remains on Wellbutrin which is helping, though she believes that she will not ever be able to fully abstain from smoking.  Weight loss: Continued heartily diet is encouraged.  If there is concern for unintentional weight loss, it would be beneficial for her to discuss this with her PCP.    Disposition: F/u with Dr. Okey Dupre or an APP in 6 months.   Medication Adjustments/Labs and Tests Ordered: Current medicines are reviewed at length with the patient today.  Concerns regarding medicines are outlined above. Medication changes, Labs and Tests ordered today are summarized above and listed in the Patient Instructions accessible in Encounters.   Signed, Eula Listen, PA-C 11/17/2022 4:44 PM     [facility] [place] [address] [place], [place] [address] [phone]

## 2022-11-17 ENCOUNTER — Encounter: Payer: Self-pay | Admitting: Physician Assistant

## 2022-11-17 ENCOUNTER — Ambulatory Visit: Payer: Managed Care, Other (non HMO) | Attending: Physician Assistant | Admitting: Physician Assistant

## 2022-11-17 VITALS — BP 110/58 | HR 68 | Ht 67.3 in | Wt 205.4 lb

## 2022-11-17 DIAGNOSIS — I5022 Chronic systolic (congestive) heart failure: Secondary | ICD-10-CM | POA: Diagnosis not present

## 2022-11-17 DIAGNOSIS — Z720 Tobacco use: Secondary | ICD-10-CM

## 2022-11-17 DIAGNOSIS — E785 Hyperlipidemia, unspecified: Secondary | ICD-10-CM

## 2022-11-17 DIAGNOSIS — I255 Ischemic cardiomyopathy: Secondary | ICD-10-CM | POA: Diagnosis not present

## 2022-11-17 DIAGNOSIS — I10 Essential (primary) hypertension: Secondary | ICD-10-CM

## 2022-11-17 DIAGNOSIS — I2510 Atherosclerotic heart disease of native coronary artery without angina pectoris: Secondary | ICD-10-CM | POA: Diagnosis not present

## 2022-11-17 NOTE — Patient Instructions (Signed)
Medication Instructions:  No changes at this time.   *If you need a refill on your cardiac medications before your next appointment, please call your pharmacy*   Lab Work: None  If you have labs (blood work) drawn today and your tests are completely normal, you will receive your results only by: MyChart Message (if you have MyChart) OR A paper copy in the mail If you have any lab test that is abnormal or we need to change your treatment, we will call you to review the results.   Testing/Procedures: None   Follow-Up: At [facility], you and your health needs are our priority.  As part of our continuing mission to provide you with exceptional heart care, we have created designated Provider Care Teams.  These Care Teams include your primary Cardiologist (physician) and Advanced Practice Providers (APPs -  Physician Assistants and Nurse Practitioners) who all work together to provide you with the care you need, when you need it.  Your next appointment:   6 month(s)  Provider:   Christopher End, MD or Ryan Dunn, PA-C     

## 2022-12-25 ENCOUNTER — Other Ambulatory Visit: Payer: Self-pay | Admitting: Physician Assistant

## 2023-06-06 NOTE — Progress Notes (Unsigned)
 Cardiology Office Note    Date:  06/07/2023   ID:  Deanna, Moran [birth year], MRN [id number]  PCP:  Tracey Harries, [name]  Cardiologist:  Yvonne Kendall, MD  Electrophysiologist:  None   Chief Complaint: Follow up  History of Present Illness:   Deanna Moran is a 58 y.o. female with history of CAD with NSTEMI in 06/2016 that was medically managed, HFrEF secondary to ICM, HTN, HLD, Crohn's disease, peripheral neuropathy, and tobacco use who presents for follow-up of her CAD and cardiomyopathy.   She was admitted to the hospital in 06/2016 with an NSTEMI with troponin peaking at 10.78.  LHC at that time demonstrated significant one-vessel CAD with subacute versus CTO of the ostial LAD with left to left and right to left collaterals.  There was moderate nonobstructive stenosis involving the LCx and RCA.  LVEF was 30 to 35% with anterior and apical wall motion abnormality noted.  She was medically managed.  Echo during that admission showed an EF of 40 to 45%, anterior hypokinesis, grade 1 diastolic dysfunction, mild mitral regurgitation, mild left atrial enlargement, and normal RV systolic function and ventricular cavity size.  She has not required ischemic evaluation since.   She was seen in the office in 10/2020 and was noted to have self discontinued all medications approximately 6 months prior to that visit, indicating she was tired of taking them.  She indicated she planned on resuming her medications following her visit.  Despite being off medications, her BP remained well controlled in the 120s to 130s systolic.  She did continue to smoke.  She underwent repeat echo, on medical therapy, on 04/13/2021 which demonstrated an improved EF of 45 to 50%, hypokinesis of the anterior, anteroseptal, and apical region, grade 1 diastolic dysfunction, normal RV systolic function and ventricular cavity size, mildly dilated left atrium, mild mitral regurgitation, and an estimated right atrial pressure of  3 mmHg.  She was last seen in the office in 10/2022 and was doing well from a cardiac perspective.  Her weight was down another 9 pounds today when compared to her visit in 05/2022, which was intentional with improved diet.  She was under increased stress and continued to smoke 1/4 pack of cigarettes daily.  She did indicate Wellbutrin was helping.  She comes in doing well from a cardiac perspective and is without symptoms of angina or cardiac decompensation.  She does note an increase in exertional fatigue and shortness of breath if she has to walk for extended time frames.  She does not note at this if she has to walk at a brisk pace however.  She is without chest pain when walking for extended time frames.  No symptoms of claudication.  No significant lower extremity swelling or progressive orthopnea.  No dizziness, presyncope, or syncope.  She continues to be limited by chronic low back pain.  No falls or symptoms concerning for bleeding.  Adherent to cardiac medications without off target effect.  Continues to smoking order pack per day.  Under increased stress at home.   Labs independently reviewed: 10/2022 -TC 120, TG 99, HDL 38, LDL 63, A1c 5.7 06/2022 - potassium 3.6, BUN 18, serum creatinine 1.08 05/2022 - albumin 4.5, AST/ALT normal  Past Medical History:  Diagnosis Date   Blood in stool    Chicken pox    Chronic back pain    Coronary artery disease    a. NSTEMI 4/18; b. LHC 4/18: LM nl, ost LAD 100% with L-L &  R-L collats, LCx nl, OM3 40%, RCA with mild irregs   GERD (gastroesophageal reflux disease)    Heart disease    HLD (hyperlipidemia)    HTN (hypertension)    Ischemic cardiomyopathy    a. echo 4/18: EF 40-45%, ant HK, Gr1DD, mild MR, mild LAE, nl RVSF   NSTEMI (non-ST elevated myocardial infarction) (HCC) 06/2016   Obesity     Past Surgical History:  Procedure Laterality Date   CARDIAC CATHETERIZATION     COLONOSCOPY WITH PROPOFOL N/A 05/13/2018   Procedure: COLONOSCOPY  WITH PROPOFOL;  Surgeon: Toney Reil, MD;  Location: [facility] ENDOSCOPY;  Service: Gastroenterology;  Laterality: N/A;   GIVENS CAPSULE STUDY N/A 10/08/2018   Procedure: GIVENS CAPSULE STUDY;  Surgeon: Toney Reil, MD;  Location: [facility] ENDOSCOPY;  Service: Gastroenterology;  Laterality: N/A;   LEFT HEART CATH AND CORONARY ANGIOGRAPHY N/A 07/11/2016   Procedure: Left Heart Cath and Coronary Angiography;  Surgeon: Yvonne Kendall, MD;  Location: [facility] INVASIVE CV LAB;  Service: Cardiovascular;  Laterality: N/A;   TUBAL LIGATION      Current Medications: Current Meds  Medication Sig   ASPIRIN LOW DOSE 81 MG EC tablet TAKE 1 TABLET BY MOUTH  DAILY   buPROPion (WELLBUTRIN SR) 150 MG 12 hr tablet Take 1 tablet (150 mg total) by mouth 2 (two) times daily.   carvedilol (COREG) 3.125 MG tablet TAKE 1 TABLET(3.125 MG) BY MOUTH TWICE DAILY WITH A MEAL   ezetimibe (ZETIA) 10 MG tablet TAKE 1 TABLET(10 MG) BY MOUTH DAILY   hydrochlorothiazide (HYDRODIURIL) 25 MG tablet Take 0.5 tablets (12.5 mg total) by mouth daily. Take one half tablet once daily   lisinopril (ZESTRIL) 10 MG tablet TAKE 1 TABLET(10 MG) BY MOUTH DAILY   naproxen sodium (ANAPROX) 220 MG tablet Take 220 mg by mouth as needed.   nitroGLYCERIN (NITROSTAT) 0.4 MG SL tablet DISSOLVE 1 TABLET UNDER THE TONGUE EVERY 5 MINS AS  NEEDED FOR CHEST PAIN MAX 3 TABS IN 15 MINUTES, CALL  911 IF CHEST PAIN PERSISTS   rosuvastatin (CRESTOR) 40 MG tablet TAKE 1 TABLET(40 MG) BY MOUTH DAILY    Allergies:   Patient has no known allergies.   Social History   Socioeconomic History   Marital status: Divorced    Spouse name: Not on file   Number of children: Not on file   Years of education: Not on file   Highest education level: Not on file  Occupational History   Not on file  Tobacco Use   Smoking status: Every Day    Current packs/day: 0.50    Average packs/day: 0.5 packs/day for 38.0 years (19.0 ttl pk-yrs)    Types: Cigarettes    Smokeless tobacco: Never   Tobacco comments:    Smoking less than 1/2 PPD  Vaping Use   Vaping status: Never Used  Substance and Sexual Activity   Alcohol use: Not Currently    Comment: ocassionally   Drug use: Never   Sexual activity: Not Currently  Other Topics Concern   Not on file  Social History Narrative   Not on file   Social Drivers of Health   Financial Resource Strain: Not on file  Food Insecurity: Not on file  Transportation Needs: Not on file  Physical Activity: Not on file  Stress: Not on file  Social Connections: Not on file     Family History:  The patient's family history includes Anuerysm in her brother; COPD in her mother; Heart attack in her  father.  ROS:   12-point review of systems is negative unless otherwise noted in the HPI.   EKGs/Labs/Other Studies Reviewed:    Studies reviewed were summarized above. The additional studies were reviewed today:  2D echo 04/13/2021: 1. Left ventricular ejection fraction, by estimation, is 45 to 50%. The  left ventricle has mildly decreased function. The left ventricle  demonstrates regional wall motion abnormalities (Hypokinesis of teh  anterior,anteroseptal and apical region). Left  ventricular diastolic parameters are consistent with Grade I diastolic  dysfunction (impaired relaxation).   2. Right ventricular systolic function is normal. The right ventricular  size is normal. Tricuspid regurgitation signal is inadequate for assessing  PA pressure.   3. Left atrial size was mildly dilated.   4. The mitral valve is normal in structure. Mild mitral valve  regurgitation. No evidence of mitral stenosis.   5. The aortic valve is normal in structure. Aortic valve regurgitation is  not visualized. No aortic stenosis is present.   6. The inferior vena cava is normal in size with greater than 50%  respiratory variability, suggesting right atrial pressure of 3 mmHg.   7. Challenging images, definity used.    Comparison(s): LVEF 40-45%, Apex/Anterior Hypokinesis. __________   LHC 06/2016: Conclusions: Significant single-vessel coronary artery disease with subacute vs. chronic total occlusion of ostial LAD. Distal vessel fills by left-to-left and right-to-left collaterals. Mild to moderate, non-obstructive disease involving the LCx and RCA. Upper normal left ventricular filling pressure. Moderate to severely reduced LV contraction with mid/apical anterior, apical, and apical inferior akinesis. LVEF 30-35%.   Recommendations: Medical therapy including aspirin and clopidogrel for 12 months. Low-dose beta-blocker, to be uptitrated as heart rate and blood pressure allow. Aggressive secondary prevention, including high-intensity statin therapy and smoking cessation. Add ACEI/ARB as blood pressure allows prior to discharge. Follow-up echocardiogram with particular attention to evaluate for LV apical thrombus. __________   Echo 06/2016: - Procedure narrative: Transthoracic echocardiography. The study    was technically difficult.  - Left ventricle: The cavity size was normal. Systolic function was    mildly to moderately reduced. The estimated ejection fraction was    in the range of 40% to 45%. Hypokinesis of the anterior    myocardium. Hypokinesis of the apical myocardium. Hypokinesis of    the anteroseptal myocardium. Doppler parameters are consistent    with abnormal left ventricular relaxation (grade 1 diastolic    dysfunction).  - Mitral valve: There was mild regurgitation.  - Left atrium: The atrium was mildly dilated.  - Right ventricle: Systolic function was normal.  - Pulmonary arteries: Systolic pressure was within the normal    range.    EKG:  EKG is ordered today.  The EKG ordered today demonstrates NSR, 75 bpm, rare PVCs, R wave progression along the precordial leads, nonspecific ST/T changes  Recent Labs: 06/16/2022: ALT 20 07/06/2022: BUN 18; Creatinine, Ser 1.08; Potassium  3.6; Sodium 140  Recent Lipid Panel    Component Value Date/Time   CHOL 103 06/16/2022 1022   TRIG 64 06/16/2022 1022   HDL 44 06/16/2022 1022   CHOLHDL 2.3 06/16/2022 1022   CHOLHDL 4.6 07/11/2016 0459   VLDL 10 07/11/2016 0459   LDLCALC 45 06/16/2022 1022   LDLDIRECT 122 (H) 01/17/2021 1039    PHYSICAL EXAM:    VS:  BP 128/78   Pulse 75   Ht 5\' 9"  (1.753 m)   Wt 221 lb 9.6 oz (100.5 kg)   SpO2 96%   BMI 32.72 kg/m  BMI: Body mass index is 32.72 kg/m.  Physical Exam Vitals reviewed.  Constitutional:      Appearance: She is well-developed.  HENT:     Head: Normocephalic and atraumatic.  Eyes:     General:        Right eye: No discharge.        Left eye: No discharge.  Cardiovascular:     Rate and Rhythm: Normal rate and regular rhythm.     Heart sounds: Normal heart sounds, S1 normal and S2 normal. Heart sounds not distant. No midsystolic click and no opening snap. No murmur heard.    No friction rub.  Pulmonary:     Effort: Pulmonary effort is normal. No respiratory distress.     Breath sounds: Normal breath sounds. No decreased breath sounds, wheezing, rhonchi or rales.  Chest:     Chest wall: No tenderness.  Musculoskeletal:     Cervical back: Normal range of motion.     Right lower leg: No edema.     Left lower leg: No edema.  Skin:    General: Skin is warm and dry.     Nails: There is no clubbing.  Neurological:     Mental Status: She is alert and oriented to person, place, and time.  Psychiatric:        Speech: Speech normal.        Behavior: Behavior normal.        Thought Content: Thought content normal.        Judgment: Judgment normal.     Wt Readings from Last 3 Encounters:  06/07/23 221 lb 9.6 oz (100.5 kg)  11/17/22 205 lb 6.4 oz (93.2 kg)  06/09/22 214 lb 3.2 oz (97.2 kg)     ASSESSMENT & PLAN:   CAD involving the native coronary arteries with exertional dyspnea and fatigue: She is without symptoms of frank chest pain.  Start  evaluation with obtaining echo to evaluate for progressive cardiomyopathy or new wall motion abnormality.  If this is reassuring anticipate myocardial PET/CT.  If however her echo shows progressive cardiomyopathy or new wall motion abnormality would pursue diagnostic cardiac cath.  Continue aggressive risk factor modification and secondary prevention including aspirin 81 mg, carvedilol 3.125 milligram twice daily, ezetimibe 10 mg, and rosuvastatin 40 mg.  HFrEF secondary to ICM: She appears euvolemic and well compensated with NYHA class II symptoms, her largest limiting factor at this time appears to be chronic back pain.  She remains on carvedilol 3.125 mg twice daily and lisinopril 10 mg daily.  She has historically preferred to minimize pharmacotherapy and in the setting escalation of GDMT is deferred at this time.  However, if she has progressive cardiomyopathy on echo we will need to pursue further escalation of GDMT.  Not currently requiring a standing loop diuretic.  HTN: Blood pressure is well controlled.  She remains on carvedilol, hydrochlorothiazide, and lisinopril.  Recent labs stable.  HLD: LDL 63 in 10/2022.  She remains on ezetimibe and rosuvastatin as outlined above.  Tobacco use: Remains on Wellbutrin.  Continued smoking cessation is encouraged.    Disposition: F/u with Dr. Okey Dupre or an APP in 1 month.   Medication Adjustments/Labs and Tests Ordered: Current medicines are reviewed at length with the patient today.  Concerns regarding medicines are outlined above. Medication changes, Labs and Tests ordered today are summarized above and listed in the Patient Instructions accessible in Encounters.   Signed, Eula Listen, PA-C 06/07/2023 4:29 PM     [facility] [address] [place], [place] [address] [phone]

## 2023-06-07 ENCOUNTER — Encounter: Payer: Self-pay | Admitting: Physician Assistant

## 2023-06-07 ENCOUNTER — Ambulatory Visit: Payer: Managed Care, Other (non HMO) | Attending: Physician Assistant | Admitting: Physician Assistant

## 2023-06-07 VITALS — BP 128/78 | HR 75 | Ht 69.0 in | Wt 221.6 lb

## 2023-06-07 DIAGNOSIS — I10 Essential (primary) hypertension: Secondary | ICD-10-CM | POA: Diagnosis not present

## 2023-06-07 DIAGNOSIS — I255 Ischemic cardiomyopathy: Secondary | ICD-10-CM | POA: Diagnosis not present

## 2023-06-07 DIAGNOSIS — R0602 Shortness of breath: Secondary | ICD-10-CM

## 2023-06-07 DIAGNOSIS — I2510 Atherosclerotic heart disease of native coronary artery without angina pectoris: Secondary | ICD-10-CM

## 2023-06-07 DIAGNOSIS — I5022 Chronic systolic (congestive) heart failure: Secondary | ICD-10-CM

## 2023-06-07 DIAGNOSIS — Z720 Tobacco use: Secondary | ICD-10-CM

## 2023-06-07 DIAGNOSIS — E785 Hyperlipidemia, unspecified: Secondary | ICD-10-CM

## 2023-06-07 NOTE — Patient Instructions (Signed)
 Medication Instructions:  Your Physician recommend you continue on your current medication as directed.    *If you need a refill on your cardiac medications before your next appointment, please call your pharmacy*   Lab Work: None ordered at this time    Testing/Procedures: Your physician has requested that you have an echocardiogram. Echocardiography is a painless test that uses sound waves to create images of your heart. It provides your doctor with information about the size and shape of your heart and how well your heart's chambers and valves are working.   You may receive an ultrasound enhancing agent through an IV if needed to better visualize your heart during the echo. This procedure takes approximately one hour.  There are no restrictions for this procedure.  This will take place at [address] [facility] [address]) [address], [place] [address]  Please note: We ask at that you not bring children with you during ultrasound (echo/ vascular) testing. Due to room size and safety concerns, children are not allowed in the ultrasound rooms during exams. Our front office staff cannot provide observation of children in our lobby area while testing is being conducted. An adult accompanying a patient to their appointment will only be allowed in the ultrasound room at the discretion of the ultrasound technician under special circumstances. We apologize for any inconvenience.   Follow-Up: At [facility], you and your health needs are our priority.  As part of our continuing mission to provide you with exceptional heart care, we have created designated Provider Care Teams.  These Care Teams include your primary Cardiologist (physician) and Advanced Practice Providers (APPs -  Physician Assistants and Nurse Practitioners) who all work together to provide you with the care you need, when you need it.   Your next appointment:   1 month(s)  Provider:   You may see Yvonne Kendall,  MD or Eula Listen, PA-C

## 2023-07-03 ENCOUNTER — Ambulatory Visit: Attending: Physician Assistant

## 2023-07-03 DIAGNOSIS — R0602 Shortness of breath: Secondary | ICD-10-CM

## 2023-07-03 MED ORDER — PERFLUTREN LIPID MICROSPHERE
1.0000 mL | INTRAVENOUS | Status: AC | PRN
Start: 2023-07-03 — End: 2023-07-03
  Administered 2023-07-03: 3 mL via INTRAVENOUS

## 2023-07-04 LAB — ECHOCARDIOGRAM COMPLETE
AV Mean grad: 3 mmHg
AV Peak grad: 4.9 mmHg
Ao pk vel: 1.11 m/s
Area-P 1/2: 3.6 cm2

## 2023-07-13 ENCOUNTER — Ambulatory Visit: Attending: Physician Assistant | Admitting: Physician Assistant

## 2023-07-13 ENCOUNTER — Encounter: Payer: Self-pay | Admitting: Physician Assistant

## 2023-07-13 VITALS — BP 136/68 | HR 87 | Ht 69.0 in | Wt 224.0 lb

## 2023-07-13 DIAGNOSIS — I10 Essential (primary) hypertension: Secondary | ICD-10-CM

## 2023-07-13 DIAGNOSIS — Z720 Tobacco use: Secondary | ICD-10-CM

## 2023-07-13 DIAGNOSIS — I5022 Chronic systolic (congestive) heart failure: Secondary | ICD-10-CM

## 2023-07-13 DIAGNOSIS — I25118 Atherosclerotic heart disease of native coronary artery with other forms of angina pectoris: Secondary | ICD-10-CM | POA: Diagnosis not present

## 2023-07-13 DIAGNOSIS — I255 Ischemic cardiomyopathy: Secondary | ICD-10-CM

## 2023-07-13 DIAGNOSIS — E785 Hyperlipidemia, unspecified: Secondary | ICD-10-CM

## 2023-07-13 NOTE — Progress Notes (Signed)
 Cardiology Office Note    Date:  07/13/2023   ID:  Shantrice, Rodenberg May 01, 1965, MRN [id number]  PCP:  Ava Lei, [name]  Cardiologist:  Sammy Crisp, MD  Electrophysiologist:  None   Chief Complaint: Follow-up  History of Present Illness:   Deanna Moran is a 58 y.o. female with history of CAD with NSTEMI in 06/2016 that was medically managed, HFrEF secondary to ICM, HTN, HLD, Crohn's disease, peripheral neuropathy, and tobacco use who presents for follow-up of her echo.   She was admitted to the hospital in 06/2016 with an NSTEMI with troponin peaking at 10.78.  LHC at that time demonstrated significant one-vessel CAD with subacute versus CTO of the ostial LAD with left to left and right to left collaterals.  There was moderate nonobstructive stenosis involving the LCx and RCA.  LVEF was 30 to 35% with anterior and apical wall motion abnormality noted.  She was medically managed.  Echo during that admission showed an EF of 40 to 45%, anterior hypokinesis, grade 1 diastolic dysfunction, mild mitral regurgitation, mild left atrial enlargement, and normal RV systolic function and ventricular cavity size.  She has not required ischemic evaluation since.   She was seen in the office in 10/2020 and was noted to have self discontinued all medications approximately 6 months prior to that visit, indicating she was tired of taking them.  She indicated she planned on resuming her medications following her visit.  Despite being off medications, her BP remained well controlled in the 120s to 130s systolic.  She did continue to smoke.  She underwent repeat echo, on medical therapy, on 04/13/2021 which demonstrated an improved EF of 45 to 50%, hypokinesis of the anterior, anteroseptal, and apical region, grade 1 diastolic dysfunction, normal RV systolic function and ventricular cavity size, mildly dilated left atrium, mild mitral regurgitation, and an estimated right atrial pressure of 3 mmHg.  She was  last seen in the office in 05/2023 and reported an increase in exertional fatigue and shortness of breath if she had to walk for extended time frames.  However, this was not noted if she had to walk at a brisk pace.  Echo on 07/03/2023 showed an EF of 45 to 50%, apical, anterior/anteroseptal wall hypokinesis, grade 1 diastolic dysfunction, normal RV systolic function and ventricular cavity size, mild mitral regurgitation, and an estimated right atrial pressure of 3 mmHg.  She comes in doing well from a cardiac perspective and is without symptoms of angina or cardiac decompensation.  She has not noted any significant exertional fatigue or shortness of breath since her last visit.  Weight stable.  No significant lower extremity swelling or progressive orthopnea.  No falls or symptoms concerning for bleeding.  Adherent to cardiac medications without offering good effect.  Continues to smoke less than 1 pack daily.   Labs independently reviewed: 10/2022 -TC 120, TG 99, HDL 38, LDL 63, A1c 5.7 06/2022 - potassium 3.6, BUN 18, serum creatinine 1.08 05/2022 - albumin 4.5, AST/ALT normal  Past Medical History:  Diagnosis Date   Blood in stool    Chicken pox    Chronic back pain    Coronary artery disease    a. NSTEMI 4/18; b. LHC 4/18: LM nl, ost LAD 100% with L-L & R-L collats, LCx nl, OM3 40%, RCA with mild irregs   GERD (gastroesophageal reflux disease)    Heart disease    HLD (hyperlipidemia)    HTN (hypertension)    Ischemic cardiomyopathy  a. echo 4/18: EF 40-45%, ant HK, Gr1DD, mild MR, mild LAE, nl RVSF   NSTEMI (non-ST elevated myocardial infarction) (HCC) 06/2016   Obesity     Past Surgical History:  Procedure Laterality Date   CARDIAC CATHETERIZATION     COLONOSCOPY WITH PROPOFOL  N/A 05/13/2018   Procedure: COLONOSCOPY WITH PROPOFOL ;  Surgeon: Selena Daily, MD;  Location: [facility] ENDOSCOPY;  Service: Gastroenterology;  Laterality: N/A;   GIVENS CAPSULE STUDY N/A 10/08/2018    Procedure: GIVENS CAPSULE STUDY;  Surgeon: Selena Daily, MD;  Location: [facility] ENDOSCOPY;  Service: Gastroenterology;  Laterality: N/A;   LEFT HEART CATH AND CORONARY ANGIOGRAPHY N/A 07/11/2016   Procedure: Left Heart Cath and Coronary Angiography;  Surgeon: Sammy Crisp, MD;  Location: [facility] INVASIVE CV LAB;  Service: Cardiovascular;  Laterality: N/A;   TUBAL LIGATION      Current Medications: Current Meds  Medication Sig   ASPIRIN  LOW DOSE 81 MG EC tablet TAKE 1 TABLET BY MOUTH  DAILY   buPROPion  (WELLBUTRIN  SR) 150 MG 12 hr tablet Take 1 tablet (150 mg total) by mouth 2 (two) times daily.   carvedilol  (COREG ) 3.125 MG tablet TAKE 1 TABLET(3.125 MG) BY MOUTH TWICE DAILY WITH A MEAL   ezetimibe  (ZETIA ) 10 MG tablet TAKE 1 TABLET(10 MG) BY MOUTH DAILY   hydrochlorothiazide  (HYDRODIURIL ) 25 MG tablet Take 0.5 tablets (12.5 mg total) by mouth daily. Take one half tablet once daily   lisinopril  (ZESTRIL ) 10 MG tablet TAKE 1 TABLET(10 MG) BY MOUTH DAILY   naproxen sodium (ANAPROX) 220 MG tablet Take 220 mg by mouth as needed.   nitroGLYCERIN  (NITROSTAT ) 0.4 MG SL tablet DISSOLVE 1 TABLET UNDER THE TONGUE EVERY 5 MINS AS  NEEDED FOR CHEST PAIN MAX 3 TABS IN 15 MINUTES, CALL  911 IF CHEST PAIN PERSISTS   rosuvastatin  (CRESTOR ) 40 MG tablet TAKE 1 TABLET(40 MG) BY MOUTH DAILY    Allergies:   Patient has no known allergies.   Social History   Socioeconomic History   Marital status: Divorced    Spouse name: Not on file   Number of children: Not on file   Years of education: Not on file   Highest education level: Not on file  Occupational History   Not on file  Tobacco Use   Smoking status: Every Day    Current packs/day: 0.50    Average packs/day: 0.5 packs/day for 38.0 years (19.0 ttl pk-yrs)    Types: Cigarettes   Smokeless tobacco: Never   Tobacco comments:    Smoking less than 1/2 PPD  Vaping Use   Vaping status: Never Used  Substance and Sexual Activity   Alcohol use: Not  Currently    Comment: ocassionally   Drug use: Never   Sexual activity: Not Currently  Other Topics Concern   Not on file  Social History Narrative   Not on file   Social Drivers of Health   Financial Resource Strain: Not on file  Food Insecurity: Not on file  Transportation Needs: Not on file  Physical Activity: Not on file  Stress: Not on file  Social Connections: Not on file     Family History:  The patient's family history includes Anuerysm in her brother; COPD in her mother; Heart attack in her father.  ROS:   12-point review of systems is negative unless otherwise noted in the HPI.   EKGs/Labs/Other Studies Reviewed:    Studies reviewed were summarized above. The additional studies were reviewed today:  2D echo/15/2025:  1. Left ventricular ejection fraction, by estimation, is 45 to 50%. The  left ventricle has mildly decreased function. The left ventricle  demonstrates regional wall motion abnormalities (apical,  anterior/anteroseptal wall hypokinesis). Left ventricular  diastolic parameters are consistent with Grade I diastolic dysfunction  (impaired relaxation).   2. Right ventricular systolic function is normal. The right ventricular  size is normal. Tricuspid regurgitation signal is inadequate for assessing  PA pressure.   3. The mitral valve is normal in structure. Mild mitral valve  regurgitation. No evidence of mitral stenosis.   4. The aortic valve is normal in structure. Aortic valve regurgitation is  not visualized. No aortic stenosis is present.   5. The inferior vena cava is normal in size with greater than 50%  respiratory variability, suggesting right atrial pressure of 3 mmHg.  __________  2D echo 04/13/2021: 1. Left ventricular ejection fraction, by estimation, is 45 to 50%. The  left ventricle has mildly decreased function. The left ventricle  demonstrates regional wall motion abnormalities (Hypokinesis of teh  anterior,anteroseptal and apical  region). Left  ventricular diastolic parameters are consistent with Grade I diastolic  dysfunction (impaired relaxation).   2. Right ventricular systolic function is normal. The right ventricular  size is normal. Tricuspid regurgitation signal is inadequate for assessing  PA pressure.   3. Left atrial size was mildly dilated.   4. The mitral valve is normal in structure. Mild mitral valve  regurgitation. No evidence of mitral stenosis.   5. The aortic valve is normal in structure. Aortic valve regurgitation is  not visualized. No aortic stenosis is present.   6. The inferior vena cava is normal in size with greater than 50%  respiratory variability, suggesting right atrial pressure of 3 mmHg.   7. Challenging images, definity  used.   Comparison(s): LVEF 40-45%, Apex/Anterior Hypokinesis. __________   LHC 06/2016: Conclusions: Significant single-vessel coronary artery disease with subacute vs. chronic total occlusion of ostial LAD. Distal vessel fills by left-to-left and right-to-left collaterals. Mild to moderate, non-obstructive disease involving the LCx and RCA. Upper normal left ventricular filling pressure. Moderate to severely reduced LV contraction with mid/apical anterior, apical, and apical inferior akinesis. LVEF 30-35%.   Recommendations: Medical therapy including aspirin  and clopidogrel  for 12 months. Low-dose beta-blocker, to be uptitrated as heart rate and blood pressure allow. Aggressive secondary prevention, including high-intensity statin therapy and smoking cessation. Add ACEI/ARB as blood pressure allows prior to discharge. Follow-up echocardiogram with particular attention to evaluate for LV apical thrombus. __________   Echo 06/2016: - Procedure narrative: Transthoracic echocardiography. The study    was technically difficult.  - Left ventricle: The cavity size was normal. Systolic function was    mildly to moderately reduced. The estimated ejection fraction was     in the range of 40% to 45%. Hypokinesis of the anterior    myocardium. Hypokinesis of the apical myocardium. Hypokinesis of    the anteroseptal myocardium. Doppler parameters are consistent    with abnormal left ventricular relaxation (grade 1 diastolic    dysfunction).  - Mitral valve: There was mild regurgitation.  - Left atrium: The atrium was mildly dilated.  - Right ventricle: Systolic function was normal.  - Pulmonary arteries: Systolic pressure was within the normal    range.    EKG:  EKG is not ordered today.    Recent Labs: No results found for requested labs within last 365 days.  Recent Lipid Panel    Component Value Date/Time   CHOL 103  06/16/2022 1022   TRIG 64 06/16/2022 1022   HDL 44 06/16/2022 1022   CHOLHDL 2.3 06/16/2022 1022   CHOLHDL 4.6 07/11/2016 0459   VLDL 10 07/11/2016 0459   LDLCALC 45 06/16/2022 1022   LDLDIRECT 122 (H) 01/17/2021 1039    PHYSICAL EXAM:    VS:  BP 136/68 (BP Location: Left Arm, Patient Position: Sitting, Cuff Size: Large)   Pulse 87   Ht 5\' 9"  (1.753 m)   Wt 224 lb (101.6 kg)   SpO2 96%   BMI 33.08 kg/m   BMI: Body mass index is 33.08 kg/m.  Physical Exam Vitals reviewed.  Constitutional:      Appearance: She is well-developed.  HENT:     Head: Normocephalic and atraumatic.  Eyes:     General:        Right eye: No discharge.        Left eye: No discharge.  Cardiovascular:     Rate and Rhythm: Normal rate and regular rhythm.     Heart sounds: Normal heart sounds, S1 normal and S2 normal. Heart sounds not distant. No midsystolic click and no opening snap. No murmur heard.    No friction rub.  Pulmonary:     Effort: Pulmonary effort is normal. No respiratory distress.     Breath sounds: Normal breath sounds. No decreased breath sounds, wheezing, rhonchi or rales.  Chest:     Chest wall: No tenderness.  Musculoskeletal:     Cervical back: Normal range of motion.     Right lower leg: No edema.     Left lower leg:  No edema.  Skin:    General: Skin is warm and dry.     Nails: There is no clubbing.  Neurological:     Mental Status: She is alert and oriented to person, place, and time.  Psychiatric:        Speech: Speech normal.        Behavior: Behavior normal.        Thought Content: Thought content normal.        Judgment: Judgment normal.     Wt Readings from Last 3 Encounters:  07/13/23 224 lb (101.6 kg)  06/07/23 221 lb 9.6 oz (100.5 kg)  11/17/22 205 lb 6.4 oz (93.2 kg)     ASSESSMENT & PLAN:   CAD involving the native coronary arteries with stable angina: She is without symptoms of angina or cardiac decompensation.  Continue aggressive risk factor modification and secondary prevention including aspirin  81 mg, carvedilol  3.125 mg twice daily, Zetia  10 mg, rosuvastatin  40 mg, and as needed SL NTG.  With improvement in symptoms, defer further ischemic testing at this time.  HFrEF secondary to ICM: Euvolemic and well compensated with NYHA class II symptoms.  Her largest limiting factor at this time is chronic low back pain.  She remains on carvedilol  3.125 mg twice daily along with lisinopril  10 mg.  She has historically preferred to minimize pharmacotherapy.  Recent echo showed stable cardiomyopathy.  Not requiring a standing loop diuretic.  HTN: Blood pressure is reasonably controlled in the office today.  She remains on carvedilol , HCTZ, and lisinopril .  Recent labs stable.  HLD: LDL 63 in 10/2022.  She remains on ezetimibe  and rosuvastatin  40 mg.  Tobacco use: Complete cessation is encouraged.  She remains on Wellbutrin .     Disposition: F/u with Dr. Nolan Battle or an APP in 6 months.   Medication Adjustments/Labs and Tests Ordered: Current medicines are reviewed at length  with the patient today.  Concerns regarding medicines are outlined above. Medication changes, Labs and Tests ordered today are summarized above and listed in the Patient Instructions accessible in Encounters.    Signed, Varney Gentleman, PA-C 07/13/2023 4:52 PM     [facility] [place] [address] [place], [place] [address] [phone]

## 2023-07-13 NOTE — Patient Instructions (Signed)
 Medication Instructions:  No changes at this time.   *If you need a refill on your cardiac medications before your next appointment, please call your pharmacy*  Lab Work: None  If you have labs (blood work) drawn today and your tests are completely normal, you will receive your results only by: MyChart Message (if you have MyChart) OR A paper copy in the mail If you have any lab test that is abnormal or we need to change your treatment, we will call you to review the results.  Testing/Procedures: None  Follow-Up: At [facility], you and your health needs are our priority.  As part of our continuing mission to provide you with exceptional heart care, our providers are all part of one team.  This team includes your primary Cardiologist (physician) and Advanced Practice Providers or APPs (Physician Assistants and Nurse Practitioners) who all work together to provide you with the care you need, when you need it.  Your next appointment:   6 month(s)  Provider:   Sammy Crisp, MD or Varney Gentleman, PA-C

## 2024-01-23 NOTE — Progress Notes (Unsigned)
 Cardiology Office Note    Date:  01/25/2024   ID:  Deanna Moran, DOB 07-15-1965, MRN [id number]  PCP:  Osker Tinnie HERO, [name]  Cardiologist:  Lonni Hanson, MD  Electrophysiologist:  None   Chief Complaint: Follow up  History of Present Illness:   Deanna Moran is a 58 y.o. female with history of CAD with NSTEMI in 06/2016 that was medically managed, HFrEF secondary to ICM, HTN, HLD, Crohn's disease, peripheral neuropathy, and tobacco use who presents for follow-up of CAD, cardiomyopathy, HTN, and HLD.   She was admitted to the hospital in 06/2016 with an NSTEMI with troponin peaking at 10.78.  LHC at that time demonstrated significant one-vessel CAD with subacute versus CTO of the ostial LAD with left to left and right to left collaterals.  There was moderate nonobstructive stenosis involving the LCx and RCA.  LVEF was 30 to 35% with anterior and apical wall motion abnormality noted.  She was medically managed.  Echo during that admission showed an EF of 40 to 45%, anterior hypokinesis, grade 1 diastolic dysfunction, mild mitral regurgitation, mild left atrial enlargement, and normal RV systolic function and ventricular cavity size.  She has not required ischemic evaluation since.   She was seen in the office in 10/2020 and was noted to have self discontinued all medications approximately 6 months prior to that visit, indicating she was tired of taking them.  She indicated she planned on resuming her medications following her visit.  Despite being off medications, her BP remained well controlled in the 120s to 130s systolic.  She did continue to smoke.  She underwent repeat echo, on medical therapy, on 04/13/2021 which demonstrated an improved EF of 45 to 50%, hypokinesis of the anterior, anteroseptal, and apical region, grade 1 diastolic dysfunction, normal RV systolic function and ventricular cavity size, mildly dilated left atrium, mild mitral regurgitation, and an estimated right atrial  pressure of 3 mmHg.  She was seen in the office in 05/2023 and reported an increase in exertional fatigue and shortness of breath if she had to walk for extended time frames.  However, this was not noted if she had to walk at a brisk pace.  Echo on 07/03/2023 showed an EF of 45 to 50%, apical, anterior/anteroseptal wall hypokinesis, grade 1 diastolic dysfunction, normal RV systolic function and ventricular cavity size, mild mitral regurgitation, and an estimated right atrial pressure of 3 mmHg.  She was most recently seen in the office in 06/2023 and was doing well, without any significant exertional fatigue or shortness of breath.  She continued to smoke 1 pack daily.  Further cardiac testing was deferred given noted improvement in symptoms.  She comes in today and remains without symptoms of angina or cardiac decompensation.  Unfortunately, due to financial constraints she has been unable to afford all of her prescription medications.  She is taking aspirin  81 mg daily.  She indicates that she does not have a note for finances to cover her rent if she only works 80 hours and a 2-week pay period.  No dizziness, presyncope, or syncope.  No falls or symptoms concerning for bleeding.  She does continue to smoke 1/2 pack/day (smoking less in the cooler months).  Her weight is down 4 pounds today when compared to her visit in 06/2023.  No lower extremity swelling or progressive orthopnea.   Labs independently reviewed: 10/2023 - A1c 5.6, TC 99, TG 87, HDL 33, LDL 49 06/2022 - potassium 3.6, BUN 18, serum creatinine 1.08  Past Medical History:  Diagnosis Date   Blood in stool    Chicken pox    Chronic back pain    Coronary artery disease    a. NSTEMI 4/18; b. LHC 4/18: LM nl, ost LAD 100% with L-L & R-L collats, LCx nl, OM3 40%, RCA with mild irregs   GERD (gastroesophageal reflux disease)    Heart disease    HLD (hyperlipidemia)    HTN (hypertension)    Ischemic cardiomyopathy    a. echo 4/18: EF  40-45%, ant HK, Gr1DD, mild MR, mild LAE, nl RVSF   NSTEMI (non-ST elevated myocardial infarction) (HCC) 06/2016   Obesity     Past Surgical History:  Procedure Laterality Date   CARDIAC CATHETERIZATION     COLONOSCOPY WITH PROPOFOL  N/A 05/13/2018   Procedure: COLONOSCOPY WITH PROPOFOL ;  Surgeon: Unk Corinn Skiff, MD;  Location: [facility] ENDOSCOPY;  Service: Gastroenterology;  Laterality: N/A;   GIVENS CAPSULE STUDY N/A 10/08/2018   Procedure: GIVENS CAPSULE STUDY;  Surgeon: Unk Corinn Skiff, MD;  Location: [facility] ENDOSCOPY;  Service: Gastroenterology;  Laterality: N/A;   LEFT HEART CATH AND CORONARY ANGIOGRAPHY N/A 07/11/2016   Procedure: Left Heart Cath and Coronary Angiography;  Surgeon: Lonni Hanson, MD;  Location: [facility] INVASIVE CV LAB;  Service: Cardiovascular;  Laterality: N/A;   TUBAL LIGATION      Current Medications: Current Meds  Medication Sig   ASPIRIN  LOW DOSE 81 MG EC tablet TAKE 1 TABLET BY MOUTH  DAILY    Allergies:   Patient has no known allergies.   Social History   Socioeconomic History   Marital status: Divorced    Spouse name: Not on file   Number of children: Not on file   Years of education: Not on file   Highest education level: Not on file  Occupational History   Not on file  Tobacco Use   Smoking status: Every Day    Current packs/day: 0.50    Average packs/day: 0.5 packs/day for 38.0 years (19.0 ttl pk-yrs)    Types: Cigarettes   Smokeless tobacco: Never   Tobacco comments:    Smoking less than 1/2 PPD  Vaping Use   Vaping status: Never Used  Substance and Sexual Activity   Alcohol use: Not Currently    Comment: ocassionally   Drug use: Never   Sexual activity: Not Currently  Other Topics Concern   Not on file  Social History Narrative   Not on file   Social Drivers of Health   Financial Resource Strain: Not on file  Food Insecurity: Not on file  Transportation Needs: Not on file  Physical Activity: Not on file  Stress: Not on  file  Social Connections: Not on file     Family History:  The patient's family history includes Anuerysm in her brother; COPD in her mother; Heart attack in her father.  ROS:   12-point review of systems is negative unless otherwise noted in the HPI.   EKGs/Labs/Other Studies Reviewed:    Studies reviewed were summarized above. The additional studies were reviewed today:  2D echo 07/03/2023: 1. Left ventricular ejection fraction, by estimation, is 45 to 50%. The  left ventricle has mildly decreased function. The left ventricle  demonstrates regional wall motion abnormalities (apical,  anterior/anteroseptal wall hypokinesis). Left ventricular  diastolic parameters are consistent with Grade I diastolic dysfunction  (impaired relaxation).   2. Right ventricular systolic function is normal. The right ventricular  size is normal. Tricuspid regurgitation signal is inadequate for  assessing  PA pressure.   3. The mitral valve is normal in structure. Mild mitral valve  regurgitation. No evidence of mitral stenosis.   4. The aortic valve is normal in structure. Aortic valve regurgitation is  not visualized. No aortic stenosis is present.   5. The inferior vena cava is normal in size with greater than 50%  respiratory variability, suggesting right atrial pressure of 3 mmHg.  __________   2D echo 04/13/2021: 1. Left ventricular ejection fraction, by estimation, is 45 to 50%. The  left ventricle has mildly decreased function. The left ventricle  demonstrates regional wall motion abnormalities (Hypokinesis of teh  anterior,anteroseptal and apical region). Left  ventricular diastolic parameters are consistent with Grade I diastolic  dysfunction (impaired relaxation).   2. Right ventricular systolic function is normal. The right ventricular  size is normal. Tricuspid regurgitation signal is inadequate for assessing  PA pressure.   3. Left atrial size was mildly dilated.   4. The mitral  valve is normal in structure. Mild mitral valve  regurgitation. No evidence of mitral stenosis.   5. The aortic valve is normal in structure. Aortic valve regurgitation is  not visualized. No aortic stenosis is present.   6. The inferior vena cava is normal in size with greater than 50%  respiratory variability, suggesting right atrial pressure of 3 mmHg.   7. Challenging images, definity  used.   Comparison(s): LVEF 40-45%, Apex/Anterior Hypokinesis. __________   LHC 06/2016: Conclusions: Significant single-vessel coronary artery disease with subacute vs. chronic total occlusion of ostial LAD. Distal vessel fills by left-to-left and right-to-left collaterals. Mild to moderate, non-obstructive disease involving the LCx and RCA. Upper normal left ventricular filling pressure. Moderate to severely reduced LV contraction with mid/apical anterior, apical, and apical inferior akinesis. LVEF 30-35%.   Recommendations: Medical therapy including aspirin  and clopidogrel  for 12 months. Low-dose beta-blocker, to be uptitrated as heart rate and blood pressure allow. Aggressive secondary prevention, including high-intensity statin therapy and smoking cessation. Add ACEI/ARB as blood pressure allows prior to discharge. Follow-up echocardiogram with particular attention to evaluate for LV apical thrombus. __________   Echo 06/2016: - Procedure narrative: Transthoracic echocardiography. The study    was technically difficult.  - Left ventricle: The cavity size was normal. Systolic function was    mildly to moderately reduced. The estimated ejection fraction was    in the range of 40% to 45%. Hypokinesis of the anterior    myocardium. Hypokinesis of the apical myocardium. Hypokinesis of    the anteroseptal myocardium. Doppler parameters are consistent    with abnormal left ventricular relaxation (grade 1 diastolic    dysfunction).  - Mitral valve: There was mild regurgitation.  - Left atrium: The  atrium was mildly dilated.  - Right ventricle: Systolic function was normal.  - Pulmonary arteries: Systolic pressure was within the normal    range.   EKG:  EKG is not ordered today.    Recent Labs: No results found for requested labs within last 365 days.  Recent Lipid Panel    Component Value Date/Time   CHOL 103 06/16/2022 1022   TRIG 64 06/16/2022 1022   HDL 44 06/16/2022 1022   CHOLHDL 2.3 06/16/2022 1022   CHOLHDL 4.6 07/11/2016 0459   VLDL 10 07/11/2016 0459   LDLCALC 45 06/16/2022 1022   LDLDIRECT 122 (H) 01/17/2021 1039    PHYSICAL EXAM:    VS:  BP (!) 140/60 (BP Location: Left Arm, Patient Position: Sitting, Cuff Size: Large)  Pulse 60   Ht 5' 9 (1.753 m)   Wt 220 lb (99.8 kg)   SpO2 95%   BMI 32.49 kg/m   BMI: Body mass index is 32.49 kg/m.  Physical Exam Vitals reviewed.  Constitutional:      Appearance: She is well-developed.  HENT:     Head: Normocephalic and atraumatic.     Mouth/Throat:     Dentition: Abnormal dentition.  Eyes:     General:        Right eye: No discharge.        Left eye: No discharge.  Cardiovascular:     Rate and Rhythm: Normal rate and regular rhythm.     Heart sounds: Normal heart sounds, S1 normal and S2 normal. Heart sounds not distant. No midsystolic click and no opening snap. No murmur heard.    No friction rub.  Pulmonary:     Effort: Pulmonary effort is normal. No respiratory distress.     Breath sounds: Normal breath sounds. No decreased breath sounds, wheezing, rhonchi or rales.  Musculoskeletal:     Cervical back: Normal range of motion.  Skin:    General: Skin is warm and dry.     Nails: There is no clubbing.  Neurological:     Mental Status: She is alert and oriented to person, place, and time.  Psychiatric:        Speech: Speech normal.        Behavior: Behavior normal.        Thought Content: Thought content normal.        Judgment: Judgment normal.     Wt Readings from Last 3 Encounters:   01/25/24 220 lb (99.8 kg)  07/13/23 224 lb (101.6 kg)  06/07/23 221 lb 9.6 oz (100.5 kg)     ASSESSMENT & PLAN:   CAD involving the native coronary arteries with stable angina: She is without symptoms of angina or cardiac decompensation.  She does remain on aspirin  81 mg daily which will be continued indefinitely.  Has not been able to afford ezetimibe  or rosuvastatin  due to financial constraints.  She has been referred to medication management as outlined below.  No indication for further ischemic testing at this time.  HFrEF secondary to ICM: Euvolemic and well compensated.  Echo from 06/2023 showed stable cardiomyopathy with an EF of 45 to 50%.  Has not been able to take carvedilol  or lisinopril  due to financial constraints as outlined below.  Check BMP to assist with resumption in pharmacotherapy when able.  HTN: Blood pressure is mildly elevated at triage at 140/60.  Has been unable to afford carvedilol , HCTZ, or lisinopril .  Resume when able.  HLD: LDL 49 in 10/2023.  Has not been able to pay for rosuvastatin  or ezetimibe .  Resume when able.  Tobacco use: Smoking a half a pack per day.  Complete cessation recommended.  Medication management: Unable to afford prescription medications.  She will be referred to medication management to see if she qualifies for reduced or free medications.     Disposition: F/u with Dr. Mady or an APP in 6 months.   Medication Adjustments/Labs and Tests Ordered: Current medicines are reviewed at length with the patient today.  Concerns regarding medicines are outlined above. Medication changes, Labs and Tests ordered today are summarized above and listed in the Patient Instructions accessible in Encounters.   Signed, Bernardino Bring, PA-C 01/25/2024 2:36 PM     [facility] [place] [address]  [place], [place] [address] [phone]

## 2024-01-25 ENCOUNTER — Encounter: Payer: Self-pay | Admitting: Physician Assistant

## 2024-01-25 ENCOUNTER — Ambulatory Visit: Attending: Physician Assistant | Admitting: Physician Assistant

## 2024-01-25 VITALS — BP 140/60 | HR 60 | Ht 69.0 in | Wt 220.0 lb

## 2024-01-25 DIAGNOSIS — I5022 Chronic systolic (congestive) heart failure: Secondary | ICD-10-CM

## 2024-01-25 DIAGNOSIS — I25118 Atherosclerotic heart disease of native coronary artery with other forms of angina pectoris: Secondary | ICD-10-CM

## 2024-01-25 DIAGNOSIS — Z79899 Other long term (current) drug therapy: Secondary | ICD-10-CM

## 2024-01-25 DIAGNOSIS — E785 Hyperlipidemia, unspecified: Secondary | ICD-10-CM

## 2024-01-25 DIAGNOSIS — I10 Essential (primary) hypertension: Secondary | ICD-10-CM

## 2024-01-25 DIAGNOSIS — I255 Ischemic cardiomyopathy: Secondary | ICD-10-CM

## 2024-01-25 DIAGNOSIS — Z720 Tobacco use: Secondary | ICD-10-CM

## 2024-01-25 NOTE — Patient Instructions (Signed)
 Medication Instructions:   Your physician recommends that you continue on your current medications as directed. Please refer to the Current Medication list given to you today.    *If you need a refill on your cardiac medications before your next appointment, please call your pharmacy*  Lab Work:  Your provider would like for you to have following labs drawn today [facility]Met.    If you have labs (blood work) drawn today and your tests are completely normal, you will receive your results only by:  MyChart Message (if you have MyChart) OR  A paper copy in the mail If you have any lab test that is abnormal or we need to change your treatment, we will call you to review the results.  Testing/Procedures:  None ordered at this time   Referrals:  None ordered at this time   Follow-Up:  At [facility], you and your health needs are our priority.  As part of our continuing mission to provide you with exceptional heart care, our providers are all part of one team.  This team includes your primary Cardiologist (physician) and Advanced Practice Providers or APPs (Physician Assistants and Nurse Practitioners) who all work together to provide you with the care you need, when you need it.  Your next appointment:   5 - 6 month(s)  Provider:    You may see Lonni Hanson, MD or one of the following Advanced Practice Providers on your designated Care Team:   Lonni Meager, NP Lesley Maffucci, PA-C Bernardino Bring, PA-C Cadence Wisdom, PA-C Tylene Lunch, NP Barnie Hila, NP    We recommend signing up for the patient portal called MyChart.  Sign up information is provided on this After Visit Summary.  MyChart is used to connect with patients for Virtual Visits (Telemedicine).  Patients are able to view lab/test results, encounter notes, upcoming appointments, etc.  Non-urgent messages can be sent to your provider as well.   To learn more about what you can do with MyChart, go to  [email or web address].

## 2024-01-26 LAB — BASIC METABOLIC PANEL WITH GFR
BUN/Creatinine Ratio: 14 (ref 9–23)
BUN: 15 mg/dL (ref 6–24)
CO2: 23 mmol/L (ref 20–29)
Calcium: 9.4 mg/dL (ref 8.7–10.2)
Chloride: 101 mmol/L (ref 96–106)
Creatinine, Ser: 1.07 mg/dL — ABNORMAL HIGH (ref 0.57–1.00)
Glucose: 81 mg/dL (ref 70–99)
Potassium: 4.3 mmol/L (ref 3.5–5.2)
Sodium: 140 mmol/L (ref 134–144)
eGFR: 60 mL/min/1.73 (ref >59)

## 2024-01-28 ENCOUNTER — Ambulatory Visit: Payer: Self-pay | Admitting: Physician Assistant

## 2024-01-28 ENCOUNTER — Other Ambulatory Visit (HOSPITAL_COMMUNITY): Payer: Self-pay

## 2024-01-28 ENCOUNTER — Telehealth: Payer: Self-pay | Admitting: Pharmacy Technician

## 2024-01-28 NOTE — Telephone Encounter (Signed)
   Patient does not have insurance.   Aspirin  81mg  she can get with goodrx at [facility] for 2.73 for 30 days Bupropion  sr 150mg  is on our 15.00 list for 30 days Carvedilol  3.25mg  is on our 5.00 list for 30 days Ezetimibe  10mg  is 16.04 for 30 days at [facility] on goodrx Hydrochlorothiazide  12.5mg  tablets and 25mg  capsules are on our 5.00 list for 30 days Lisinopril  10mg  is on our 5.00 list for 30 days Didn't do naproxen since that is aleve and it is not on our list Nitroglycerin  is 9.00 at [facility] no goodrx needed that is their price Rosuvastatin  40mg  is 10.00 on our list for 30 days  Goodrx aspirin : BIN [id number] PCN [id number] Member ID [id number]  Goodrx ezetimibe : BIN [id number] PCN [id number] Member ID [id number]  Sent message back to RN and isabel

## 2024-01-29 ENCOUNTER — Other Ambulatory Visit: Payer: Self-pay | Admitting: Emergency Medicine

## 2024-01-29 DIAGNOSIS — Z59869 Financial insecurity, unspecified: Secondary | ICD-10-CM

## 2024-01-30 ENCOUNTER — Other Ambulatory Visit (HOSPITAL_COMMUNITY): Payer: Self-pay

## 2024-01-30 ENCOUNTER — Telehealth (HOSPITAL_COMMUNITY): Payer: Self-pay | Admitting: Licensed Clinical Social Worker

## 2024-01-30 NOTE — Telephone Encounter (Signed)
 Patient does have insurance: [employer] gallery [employer] and [employer] for prescription:  Bin: [id number] Pcn: [id number] Id: [id number] Group: [id number]  Per test claims:  She gets aspirin  otc  Bupropion  sr 150mg  #30 days is $8.43 Carvedilol  3.125mg  #30 days is $5.36 Ezetimibe  10mg  #30 days is $2.34 Hydrochlorothiazide  25mg  #30days is $1.77 Lisinopril  10mg  #30 days is $3.73 She gets naproxen otc She said she isn't going to renew the nitroglycerin  Rosuvastatin  40mg  #30ds is $5.19

## 2024-01-30 NOTE — Telephone Encounter (Signed)
 H&V Care Navigation CSW Progress Note  Clinical Social Worker consulted to speak with pt regarding insurance concerns/inability to afford medications.  Patient reports she does have insurance- sent insurance info to pharmacy advocate who was able to confirm coverage and report current medication copays which are more affordable than the self-pay assistance options.  Screened for Medicaid and pt is over income (makes about $2,100/month per patient).  Patient updated regarding above but states it is hard to afford medication copays as they usually fall all at once.  Pt reports she doesn't qualify for food stamps.  Agreeable to CSW sending list of food pantries to help relieve financial burden on purchasing food.  States she can't [facility] to [facility] due to insurance preference so unable to set up [facility] account to allow her to pay what she can and spread payments out.  CSW encouraged pt to reach out if she is unable to get medications in future.  Deanna HILARIO Leech, LCSW Clinical Social Worker [facility] Desk#: [phone] Cell#: [phone]

## 2024-06-24 ENCOUNTER — Ambulatory Visit: Admitting: Physician Assistant
